# Patient Record
Sex: Male | Born: 1970 | Race: White | Hispanic: No | Marital: Married | Smoking: Current every day smoker
Health system: Southern US, Community
[De-identification: ages and names within clinical notes are randomized; demographics above are authoritative.]

## PROBLEM LIST (undated history)

## (undated) DIAGNOSIS — N400 Enlarged prostate without lower urinary tract symptoms: Secondary | ICD-10-CM

## (undated) DIAGNOSIS — E669 Obesity, unspecified: Secondary | ICD-10-CM

## (undated) DIAGNOSIS — E785 Hyperlipidemia, unspecified: Secondary | ICD-10-CM

## (undated) DIAGNOSIS — G4730 Sleep apnea, unspecified: Secondary | ICD-10-CM

## (undated) DIAGNOSIS — E349 Endocrine disorder, unspecified: Secondary | ICD-10-CM

## (undated) DIAGNOSIS — N403 Nodular prostate with lower urinary tract symptoms: Secondary | ICD-10-CM

## (undated) DIAGNOSIS — K429 Umbilical hernia without obstruction or gangrene: Secondary | ICD-10-CM

## (undated) DIAGNOSIS — N451 Epididymitis: Secondary | ICD-10-CM

## (undated) DIAGNOSIS — R399 Unspecified symptoms and signs involving the genitourinary system: Secondary | ICD-10-CM

## (undated) DIAGNOSIS — Z98811 Dental restoration status: Secondary | ICD-10-CM

## (undated) DIAGNOSIS — R519 Headache, unspecified: Secondary | ICD-10-CM

## (undated) DIAGNOSIS — R51 Headache: Secondary | ICD-10-CM

## (undated) HISTORY — DX: Umbilical hernia without obstruction or gangrene: K42.9

## (undated) HISTORY — DX: Unspecified symptoms and signs involving the genitourinary system: R39.9

## (undated) HISTORY — DX: Endocrine disorder, unspecified: E34.9

## (undated) HISTORY — DX: Obesity, unspecified: E66.9

## (undated) HISTORY — DX: Hyperlipidemia, unspecified: E78.5

## (undated) HISTORY — DX: Epididymitis: N45.1

## (undated) HISTORY — PX: FRACTURE SURGERY: SHX138

## (undated) HISTORY — DX: Sleep apnea, unspecified: G47.30

## (undated) HISTORY — DX: Nodular prostate with lower urinary tract symptoms: N40.3

## (undated) HISTORY — PX: ROOT CANAL: SHX2363

## (undated) HISTORY — DX: Benign prostatic hyperplasia without lower urinary tract symptoms: N40.0

## (undated) HISTORY — PX: WRIST FRACTURE SURGERY: SHX121

---

## 2004-01-04 ENCOUNTER — Emergency Department: Payer: Self-pay | Admitting: Emergency Medicine

## 2006-07-22 ENCOUNTER — Emergency Department: Payer: Self-pay | Admitting: Emergency Medicine

## 2007-01-13 ENCOUNTER — Emergency Department: Payer: Self-pay | Admitting: Emergency Medicine

## 2008-04-13 IMAGING — CT CT MAXILLOFACIAL WITHOUT CONTRAST
1 series · 16 of 30 positions shown, 20 images · non-contrast
Comparison: none

REASON FOR EXAM: hit with baseball on chin, pain left TMJ area x 6 days
Minor Care 1
COMMENTS:   LMP: (Male)

[Series 4: facial 3.0 h60f · axial · 0.31mm/px · z∈[-219,-72]mm · 16 of 53 slices shown, 20 images]
[im 2/53  brain]
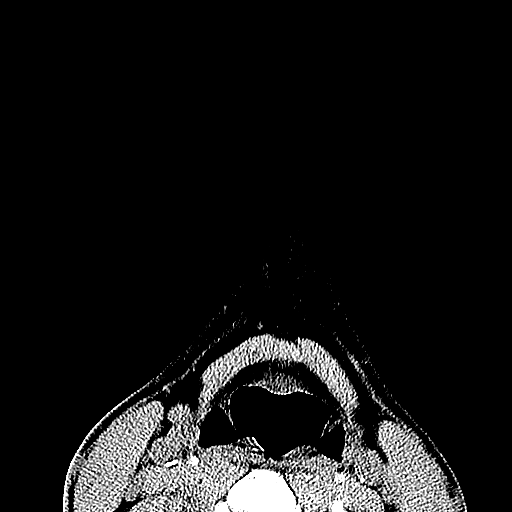
[im 2/53  bone]
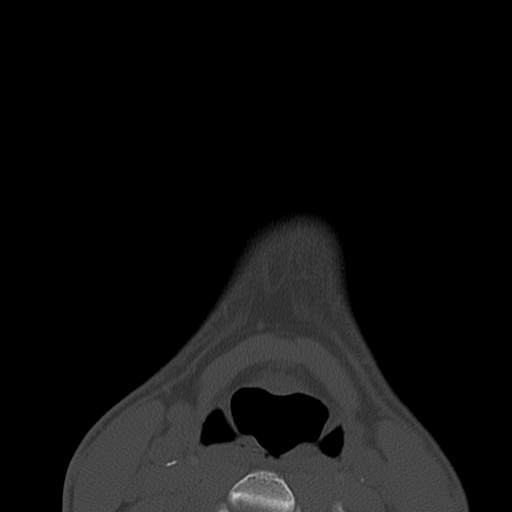
[im 6/53  bone]
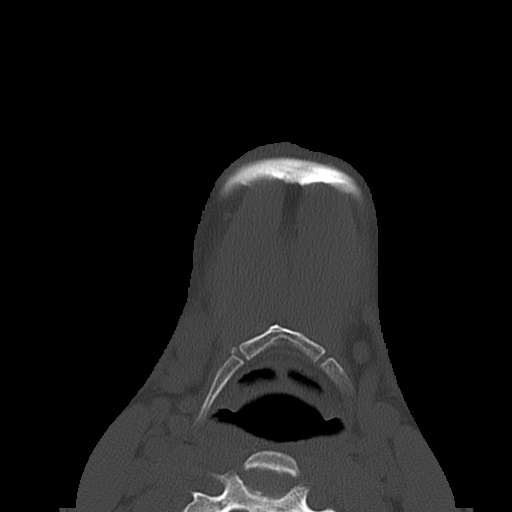
[im 9/53  bone]
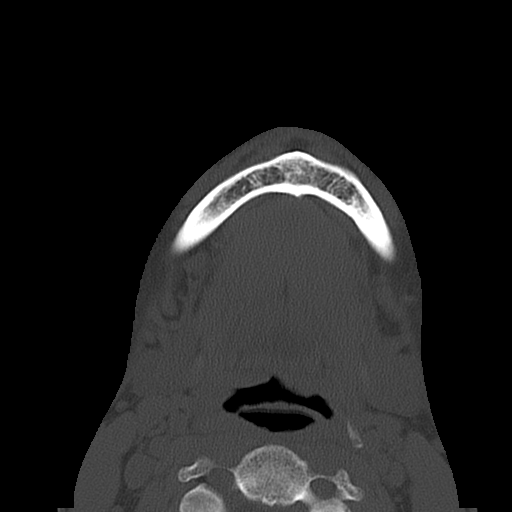
[im 13/53  bone]
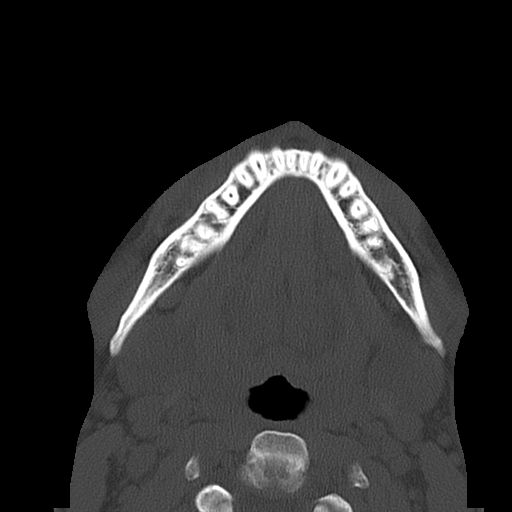
[im 15/53  brain]
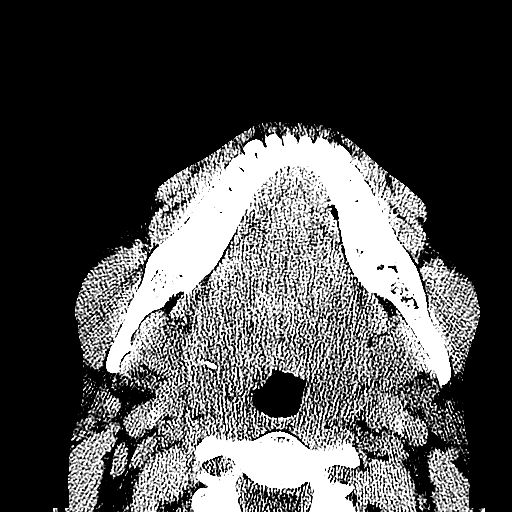
[im 15/53  bone]
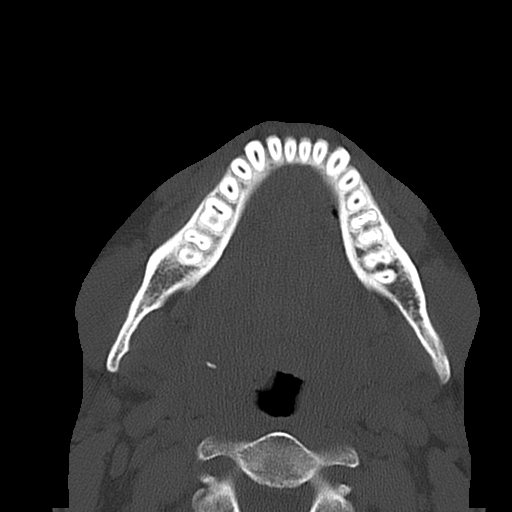
[im 18/53  bone]
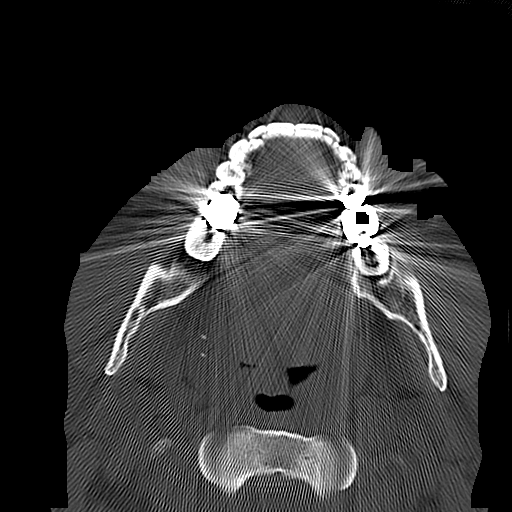
[im 22/53  bone]
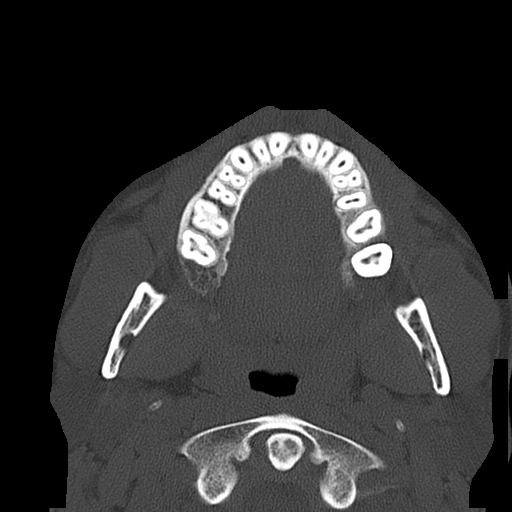
[im 26/53  bone]
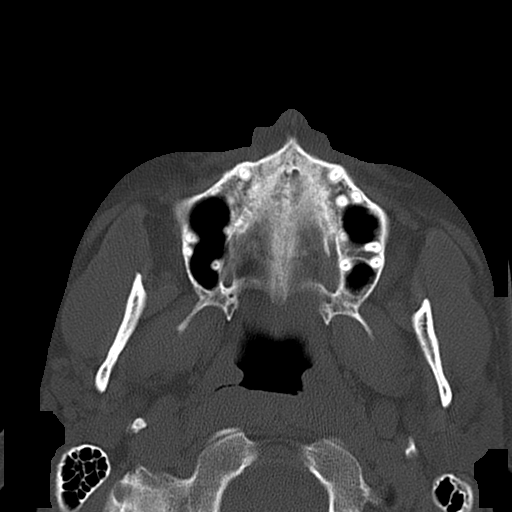
[im 27/53  brain]
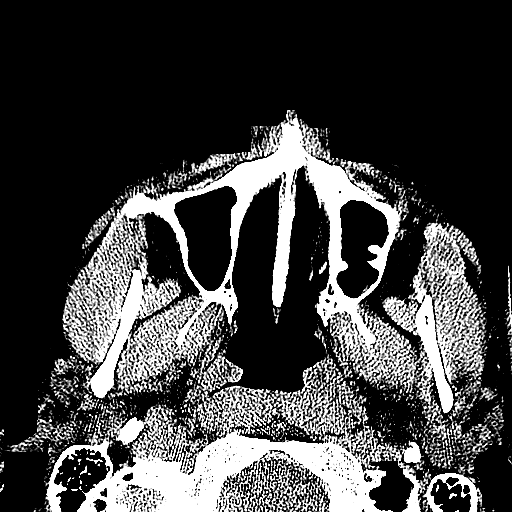
[im 27/53  bone]
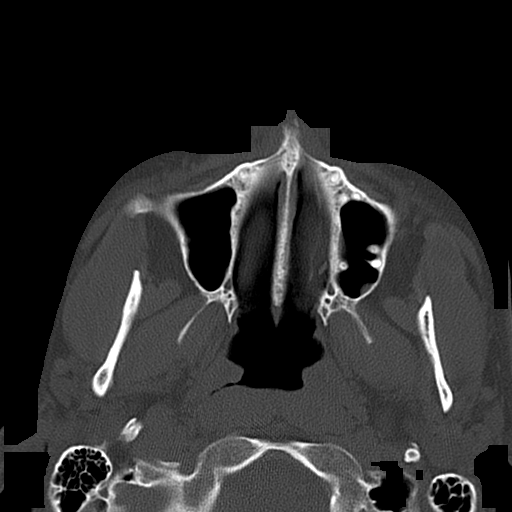
[im 31/53  bone]
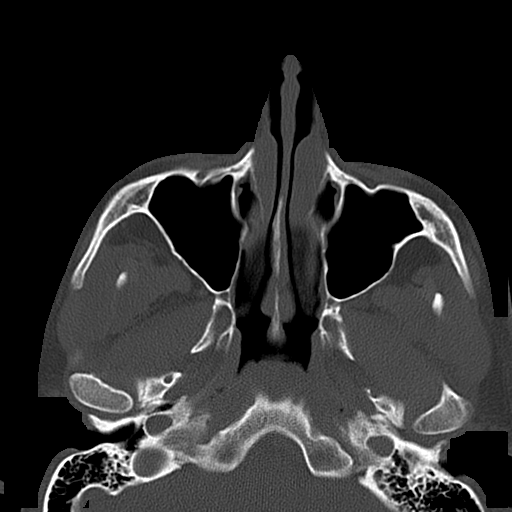
[im 35/53  bone]
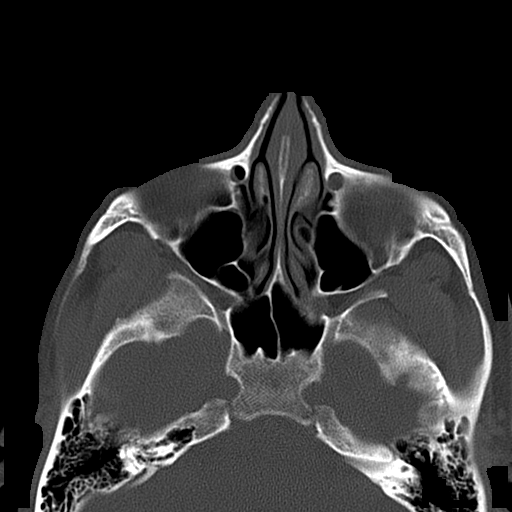
[im 38/53  bone]
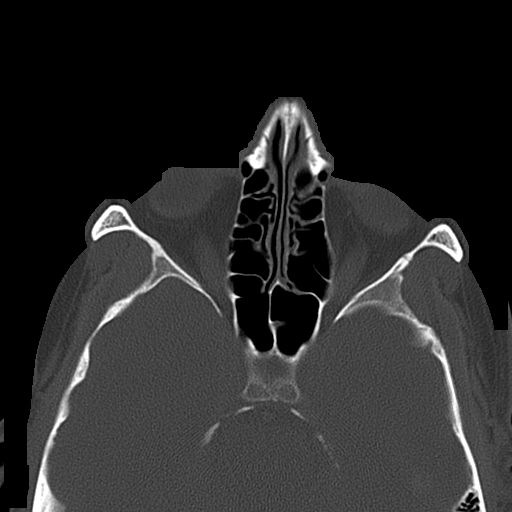
[im 40/53  brain]
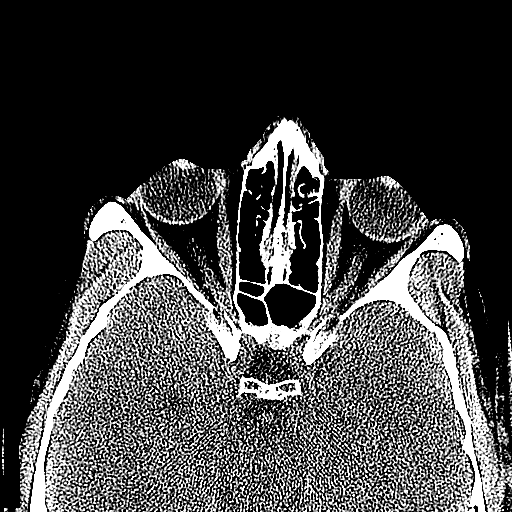
[im 40/53  bone]
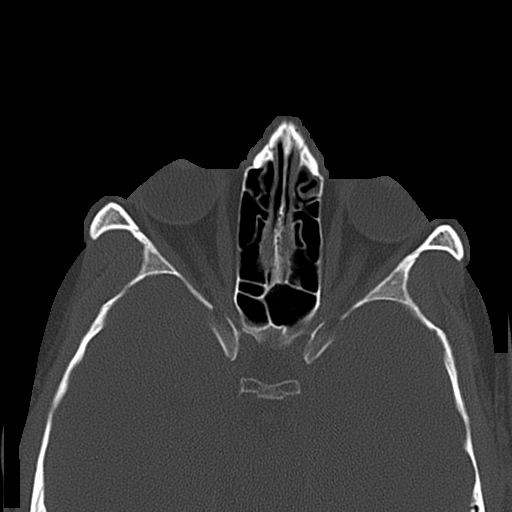
[im 44/53  bone]
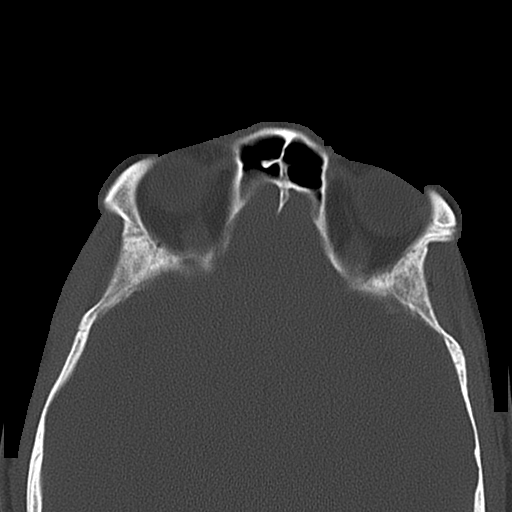
[im 47/53  bone]
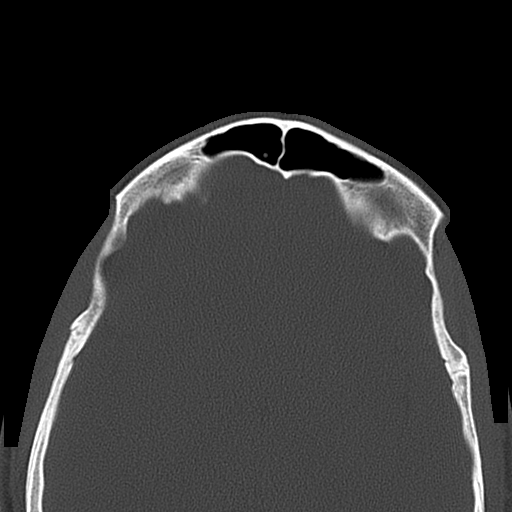
[im 51/53  bone]
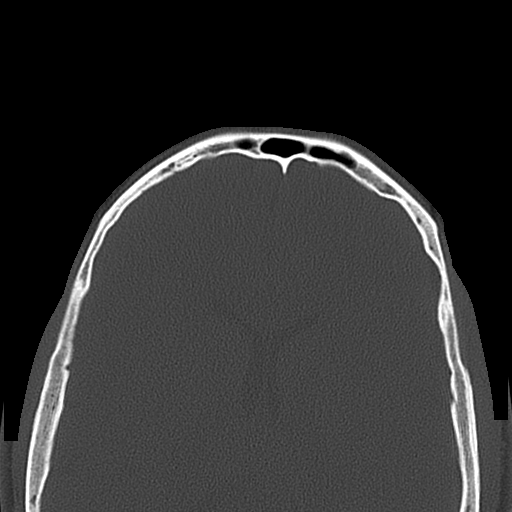

[16 of 30 positions shown; findings below may reference images not displayed]

PROCEDURE:     CT  - CT MAXILLOFACIAL AREA WO  - July 22, 2006  [DATE]

RESULT:     Multislice axial acquisition through the maxillofacial region is
performed. Coronal reconstructions are performed. The images are
reconstructed in bone window settings only. The mandible and
temporomandibular joints appear to be intact with no dislocation or fracture
demonstrated. The paranasal sinuses are normally aerated. There is a defect
in the floor of the left orbit consistent with a depressed orbital floor
fracture. No muscular entrapment is present. The nasal septum approximates
midline. The nasal bones are intact.
IMPRESSION: 1. Probable old left orbital floor fracture. This is a depressed fracture.
2. No acute bony abnormality suggested.

## 2013-02-25 HISTORY — PX: PROSTATE BIOPSY: SHX241

## 2013-07-02 LAB — LIPID PANEL
Cholesterol: 205 mg/dL — AB (ref 0–200)
HDL: 35 mg/dL (ref 35–70)
LDL CALC: 127 mg/dL
Triglycerides: 216 mg/dL — AB (ref 40–160)

## 2013-07-05 LAB — PSA: PSA: NORMAL

## 2013-07-21 ENCOUNTER — Encounter: Payer: Self-pay | Admitting: General Surgery

## 2013-08-04 ENCOUNTER — Encounter: Payer: Self-pay | Admitting: General Surgery

## 2013-08-04 ENCOUNTER — Telehealth: Payer: Self-pay | Admitting: *Deleted

## 2013-08-04 ENCOUNTER — Ambulatory Visit (INDEPENDENT_AMBULATORY_CARE_PROVIDER_SITE_OTHER): Payer: BC Managed Care – PPO | Admitting: General Surgery

## 2013-08-04 VITALS — BP 120/80 | HR 80 | Resp 14 | Ht 71.0 in | Wt 230.0 lb

## 2013-08-04 DIAGNOSIS — K429 Umbilical hernia without obstruction or gangrene: Secondary | ICD-10-CM

## 2013-08-04 NOTE — Progress Notes (Signed)
Patient ID: Jeffery [name], male   DOB: July 27, 1970, 43 y.o.   MRN: [id number]  Chief Complaint  Patient presents with  . Other    Evaluation of umbilical hernia    HPI Jeffery [name] is a 43 y.o. male who presents for an evaluation of an umbilical hernia. The patient noticed the hernia a couple of months ago. He denies any pain or tenderness in the area. It has not gotten larger since he first noticed it.    HPI  Past Medical History  Diagnosis Date  . Testosterone deficiency   . Enlarged prostate     Past Surgical History  Procedure Laterality Date  . Wrist fracture surgery Right   . Prostate biopsy  2015    Family History  Problem Relation Age of Onset  . Diabetes Mother     Social History History  Substance Use Topics  . Smoking status: Former Smoker -- 20 years  . Smokeless tobacco: Not on file  . Alcohol Use: No    No Known Allergies  Current Outpatient Prescriptions  Medication Sig Dispense Refill  . ANDROGEL PUMP 20.25 MG/ACT (1.62%) GEL Apply 1 application topically daily.      . tadalafil (CIALIS) 5 MG tablet Take 5 mg by mouth daily as needed for erectile dysfunction.       No current facility-administered medications for this visit.    Review of Systems Review of Systems  Constitutional: Negative.   Respiratory: Negative.   Cardiovascular: Negative.   Gastrointestinal: Negative.     Blood pressure 120/80, pulse 80, resp. rate 14, height 5\' 11"  (1.803 m), weight 230 lb (104.327 kg).  Physical Exam Physical Exam  Constitutional: He is oriented to person, place, and time. He appears well-developed and well-nourished.  Cardiovascular: Normal rate, regular rhythm and normal heart sounds.   No murmur heard. Pulmonary/Chest: Effort normal and breath sounds normal.  Abdominal: Soft. Normal appearance and bowel sounds are normal. There is no hepatosplenomegaly. There is tenderness (with touching.). A hernia (1 cm umbilical defect) is present.   Neurological: He is alert and oriented to person, place, and time.  Skin: Skin is warm and dry.    Data Reviewed PCP notes of 07/20/2013 were reviewed.   Assessment    No hernia, minimally symptomatic. Incarcerated preperitoneal fat.    Plan    Options for management were reviewed. The patient drains physically active. 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 He is well versed on proper lifting techniques. I think based on his young age the benefit from elective repair. The use of prosthetic mesh would be determined at the time of surgery based on the size of the defect.  Risks associated with hernia surgery with or without mesh placement were reviewed. The patient will consider his options and notify the office of how he would like to proceed.    PCP and Red. MD: Dr. Duane Boston, [place] Gleason 08/06/2013, 12:39 PM

## 2013-08-04 NOTE — Telephone Encounter (Signed)
Pt called and wanted to know if we can bill him for his co pay on visit 08/04/13 at 2:30, spoke with Dr.Byrnett and he said that is fine to bill pt

## 2013-08-04 NOTE — Patient Instructions (Addendum)
Patient to decide when he wants to have his hernia repair. The patient is aware to call back for any questions or concerns.   Open Hernia Repair Open hernia repair is surgery to fix a hernia. A hernia occurs when an internal organ or tissue pushes out through a weak spot in the abdominal wall muscles. Hernias commonly occur in the groin and around the navel. Most hernias tend to get worse over time. Surgery is often done to prevent the hernia from getting bigger, becoming uncomfortable, or becoming an emergency. Emergency surgery may be needed if abdominal contents get stuck in the opening (incarcerated hernia) or the blood supply gets cut off (strangulated hernia). In an open repair, a large cut (incision) is made in the abdomen to perform the surgery. LET [facility] CARE PROVIDER KNOW ABOUT:  Any allergies you have.  All medicines you are taking, including vitamins, herbs, eye drops, creams, and over-the-counter medicines.  Previous problems you or members of your family have had with the use of anesthetics.  Any blood disorders you have.  Previous surgeries you have had.  Medical conditions you have. RISKS AND COMPLICATIONS Generally, this is a safe procedure. However, as with any procedure, complications can occur. Possible complications include:  Infection.  Bleeding.  Nerve injury.  Chronic pain.  The hernia can come back.  Injury to the intestines. BEFORE THE PROCEDURE  Ask your health care provider about changing or stopping any regular medicines. Avoid taking aspirin or blood thinners as directed by your health care provider.  Do noteat or drink anything after midnight the night before surgery.  If you smoke, do not smoke for at least 2 weeks before your surgery.  Do not drink alcohol the day before your surgery.  Let your health care provider know if you develop a cold or any infection before your surgery.  Arrange for someone to drive you home after the  procedure or after your hospital stay. Also arrange for someone to help you with activities during recovery. PROCEDURE   Small monitors will be put on your body. They are used to check your heart, blood pressure, and oxygen level.   An IV access tube will be put into one of your veins. Medicine will be able to flow directly into your body through this IV tube.   You might be given a medicine to help you relax (sedative).   You will be given a medicine to make you sleep (general anesthetic). A breathing tube may be placed into your lungs during the procedure.  A cut (incision) is made over the hernia defect, and the contents are pushed back into the abdomen.  If the hernia is small, stitches may be used to bring the muscle edges back together.  Typically, a surgeon will place a mesh patch made of man-made material (synthetic) to cover the defect. The mesh is sewn to healthy muscle. This reduces the risk of the hernia coming back.  The tissue and skin over the hernia are then closed with stitches or staples.  If the hernia was large, a drain may be left in place to collect excess fluid where the hernia used to be.  Bandages (dressings) are used to cover the incision. AFTER THE PROCEDURE  You will be taken to a recovery area where your progress will be monitored.  If the hernia was small or in the groin (inguinal) region, you will likely be allowed to go home once you are awake, stable, and taking fluids well.  If the hernia was large, you may have to wait for your bowel function to return. You may need to stay in the hospital for 2 3 days until you can eat and your pain is controlled. A drain may be left in place for 5 7 days. You will be taught how to care for the drain. Document Released: 08/07/2000 Document Revised: 12/02/2012 Document Reviewed: 09/23/2012 [facility] Patient Information 2014 [place], [place].

## 2013-08-06 DIAGNOSIS — K429 Umbilical hernia without obstruction or gangrene: Secondary | ICD-10-CM | POA: Insufficient documentation

## 2014-01-28 ENCOUNTER — Telehealth: Payer: Self-pay | Admitting: *Deleted

## 2014-01-28 NOTE — Telephone Encounter (Signed)
Patient has decided to go ahead with surgical repair of his umbilical hernia. He would like to to this on 02/11/14 as he will be out of work at that time on vacation. I let him know that he would need a pre op visit here with the surgeon. I told him I would call him to go over surgical instructions when his orders are received.

## 2014-01-31 ENCOUNTER — Encounter: Payer: Self-pay | Admitting: *Deleted

## 2014-01-31 NOTE — Progress Notes (Signed)
Patient ID: Jeffery [name], male   DOB: [birth year], 43 y.o.   MRN: [id number]  Patient's surgery has been scheduled for 02-11-14 at [facility].  This patient will come in for a pre-op visit next Tuesday, 02-08-14 at 3 pm.

## 2014-02-01 ENCOUNTER — Other Ambulatory Visit: Payer: Self-pay | Admitting: General Surgery

## 2014-02-01 DIAGNOSIS — K429 Umbilical hernia without obstruction or gangrene: Secondary | ICD-10-CM

## 2014-02-04 ENCOUNTER — Telehealth: Payer: Self-pay

## 2014-02-04 NOTE — Telephone Encounter (Signed)
Patient called and wanted to reschedule his surgery for in January due to his health savings account rolling over. Patient's surgery has been rescheduled for 03/11/14 at [facility]. He will come into the office for a pre op visit with Dr Bary Castilla on 03/02/14. He will pre admit by phone on 03/03/14. Patient is aware of dates, time, and instructions. Surgery has been rescheduled with the OR.

## 2014-02-08 ENCOUNTER — Ambulatory Visit: Payer: BC Managed Care – PPO | Admitting: General Surgery

## 2014-03-02 ENCOUNTER — Other Ambulatory Visit: Payer: Self-pay | Admitting: General Surgery

## 2014-03-02 ENCOUNTER — Ambulatory Visit: Payer: BC Managed Care – PPO | Admitting: General Surgery

## 2014-03-02 DIAGNOSIS — K429 Umbilical hernia without obstruction or gangrene: Secondary | ICD-10-CM

## 2014-03-08 ENCOUNTER — Encounter: Payer: Self-pay | Admitting: General Surgery

## 2014-03-08 ENCOUNTER — Ambulatory Visit (INDEPENDENT_AMBULATORY_CARE_PROVIDER_SITE_OTHER): Payer: BLUE CROSS/BLUE SHIELD | Admitting: General Surgery

## 2014-03-08 VITALS — BP 118/84 | HR 84 | Resp 14 | Ht 71.0 in | Wt 224.0 lb

## 2014-03-08 DIAGNOSIS — K429 Umbilical hernia without obstruction or gangrene: Secondary | ICD-10-CM

## 2014-03-08 NOTE — Progress Notes (Signed)
Patient ID: Jeffery [name], male   DOB: 1970-04-07, 44 y.o.   MRN: [id number]  Chief Complaint  Patient presents with  . Pre-op Exam    umbilical hernia    HPI Jeffery [name] is a 44 y.o. male who presents for a pre op evaluation of an umbilical hernia. He states no new problems at this time.   HPI  Past Medical History  Diagnosis Date  . Testosterone deficiency   . Enlarged prostate   . Umbilical hernia     Past Surgical History  Procedure Laterality Date  . Wrist fracture surgery Right   . Prostate biopsy  2015    Family History  Problem Relation Age of Onset  . Diabetes Mother     Social History History  Substance Use Topics  . Smoking status: Current Every Day Smoker -- 0.50 packs/day for 20 years  . Smokeless tobacco: Not on file  . Alcohol Use: No    No Known Allergies  Current Outpatient Prescriptions  Medication Sig Dispense Refill  . ANDROGEL PUMP 20.25 MG/ACT (1.62%) GEL Apply 1 application topically daily.    . tadalafil (CIALIS) 5 MG tablet Take 5 mg by mouth daily as needed for erectile dysfunction.     No current facility-administered medications for this visit.    Review of Systems Review of Systems  Constitutional: Negative.   Respiratory: Negative.   Cardiovascular: Negative.   Gastrointestinal: Negative.     Blood pressure 118/84, pulse 84, resp. rate 14, height 5\' 11"  (1.803 m), weight 224 lb (101.606 kg).  Physical Exam Physical Exam  Constitutional: He is oriented to person, place, and time. He appears well-developed and well-nourished.  Cardiovascular: Normal rate, regular rhythm and normal heart sounds.   No murmur heard. Pulmonary/Chest: Effort normal and breath sounds normal.  Abdominal: Soft. Normal appearance and bowel sounds are normal. There is no hepatosplenomegaly. There is no tenderness. A hernia (umbilical hernia present) is present.  Neurological: He is alert and oriented to person, place, and time.  Skin: Skin is  warm and dry.    Data Reviewed none  Assessment    Umbilical hernia.     Plan    Plans are for repair on 03/11/2014 as an outpatient. 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 The need to avoid heavy lifting for 3 weeks post procedure was reviewed. 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   He's been instructed not to drive until pain free.  The patient reports no voiding symptoms while making use of Cialis. ( Followed by Jeffery [name], M.D. In urology).    PCP:  Jeffery [name] 03/09/2014, 7:01 AM

## 2014-03-08 NOTE — Patient Instructions (Signed)
Patient is scheduled for hernia repair. The patient is aware to call back for any questions or concerns.

## 2014-03-11 ENCOUNTER — Ambulatory Visit: Payer: Self-pay | Admitting: General Surgery

## 2014-03-11 DIAGNOSIS — K429 Umbilical hernia without obstruction or gangrene: Secondary | ICD-10-CM

## 2014-03-11 HISTORY — PX: HERNIA REPAIR: SHX51

## 2014-03-14 ENCOUNTER — Encounter: Payer: Self-pay | Admitting: General Surgery

## 2014-03-24 ENCOUNTER — Ambulatory Visit (INDEPENDENT_AMBULATORY_CARE_PROVIDER_SITE_OTHER): Payer: Self-pay | Admitting: General Surgery

## 2014-03-24 ENCOUNTER — Encounter: Payer: Self-pay | Admitting: General Surgery

## 2014-03-24 VITALS — BP 118/78 | HR 76 | Resp 12 | Ht 71.0 in | Wt 224.0 lb

## 2014-03-24 DIAGNOSIS — K429 Umbilical hernia without obstruction or gangrene: Secondary | ICD-10-CM

## 2014-03-24 NOTE — Progress Notes (Signed)
Patient ID: Jeffery [name], male   DOB: Sep 14, 1970, 44 y.o.   MRN: [id number]  Chief Complaint  Patient presents with  . Routine Post Op    umbilical hernia    HPI Jeffery [name] is a 44 y.o. male here today for his post op umbilical hernia repair done on 03/11/14. Patient states he is doing well.Marland Kitchen  HPI  Past Medical History  Diagnosis Date  . Testosterone deficiency   . Enlarged prostate   . Umbilical hernia     Past Surgical History  Procedure Laterality Date  . Wrist fracture surgery Right   . Prostate biopsy  2015  . Hernia repair  08/27/48    umbilical hernia    Family History  Problem Relation Age of Onset  . Diabetes Mother     Social History History  Substance Use Topics  . Smoking status: Current Every Day Smoker -- 0.50 packs/day for 20 years  . Smokeless tobacco: Not on file  . Alcohol Use: No    No Known Allergies  Current Outpatient Prescriptions  Medication Sig Dispense Refill  . ANDROGEL PUMP 20.25 MG/ACT (1.62%) GEL Apply 1 application topically daily.    . tadalafil (CIALIS) 5 MG tablet Take 5 mg by mouth daily as needed for erectile dysfunction.     No current facility-administered medications for this visit.    Review of Systems Review of Systems  Constitutional: Negative.   Respiratory: Negative.   Cardiovascular: Negative.     Blood pressure 118/78, pulse 76, resp. rate 12, height 5\' 11"  (1.803 m), weight 224 lb (101.606 kg).  Physical Exam Physical Exam  Constitutional: He is oriented to person, place, and time. He appears well-nourished.  Cardiovascular: Normal rate, regular rhythm and normal heart sounds.   Pulmonary/Chest: Effort normal and breath sounds normal.  Abdominal:    Neurological: He is alert and oriented to person, place, and time.  Skin: Skin is warm and dry.  No erythema or induration at the surgical site.     Assessment    Doing well status post umbilical hernia repair.    Plan    The importance of  careful lifting technique was discussed. Patient to return as needed. Patient to return back to work on 04/11/14. Proper lifting techniques reviewed.  The patient was asked to apply Neosporin ointment to the scab until this completely sloughs to facilitate healing and minimize bleeding risks.     PCP: Maryanna Shape 03/26/2014, 8:47 AM

## 2014-03-24 NOTE — Patient Instructions (Addendum)
Patient to return as needed. Patient to return back to work on 04/11/14. Proper lifting techniques reviewed.

## 2014-05-27 ENCOUNTER — Emergency Department
Admit: 2014-05-27 | Disposition: A | Discharge status: Home / Self Care | Payer: Self-pay | Admitting: Emergency Medicine

## 2014-05-27 LAB — URINALYSIS, COMPLETE
BACTERIA: NONE SEEN
BILIRUBIN, UR: NEGATIVE
Blood: NEGATIVE
GLUCOSE, UR: NEGATIVE mg/dL (ref 0–75)
Ketone: NEGATIVE
LEUKOCYTE ESTERASE: NEGATIVE
Nitrite: NEGATIVE
PH: 6 (ref 4.5–8.0)
Protein: NEGATIVE
RBC,UR: <1 /HPF (ref 0–5)
Specific Gravity: 1.014 (ref 1.003–1.030)
Squamous Epithelial: NONE SEEN
WBC UR: <1 /HPF (ref 0–5)

## 2014-06-26 NOTE — Op Note (Signed)
PATIENT NAME:  Jeffery [name], Jeffery [name] MR#:  [id number] DATE OF BIRTH:  August 01, 1970  DATE OF PROCEDURE:  03/11/2014  PREOPERATIVE DIAGNOSIS: Symptomatic umbilical hernia.   POSTOPERATIVE DIAGNOSIS: Symptomatic umbilical hernia.   OPERATIVE PROCEDURE: Repair of umbilical hernia.   SURGEON: Hervey Ard, MD   ANESTHESIA: General by LMA under Dr. Benjamine Mola, Marcaine 0.5% plain, 30 mL local infiltration, Toradol 30 mg.   ESTIMATED BLOOD LOSS: Minimal.   CLINICAL NOTE: This 44 year old male has developed a symptomatic umbilical hernia with nonreducible preperitoneal fat. He is admitted for elective repair.   OPERATIVE NOTE: With the patient under adequate general anesthesia and the hair previously removed with clippers, the area was prepped with ChloraPrep and draped. Marcaine was infiltrated for postoperative analgesia. An infraumbilical incision was made and carried down through the skin and subcutaneous tissue with hemostasis achieved with electrocautery. The hernia sac was excised and discarded. The fascial defect was approximately 1 cm in diameter. The undersurface of the fascia was cleared and the defect closed with interrupted 0 Surgilon sutures. These were placed and then tied sequentially. The umbilical skin was tacked to the fascia with a 3-0 Vicryl figure-of-eight suture. The adipose layer was closed with a running 3-0 Vicryl, and the skin closed with running 4-0 Vicryl subcuticular suture. Prior to the procedure, Marcaine was infiltrated for postoperative analgesia. Toradol was placed into the wound prior to closure. Benzoin and Steri-Strips followed by Telfa and Tegaderm dressings were applied.   The patient tolerated the procedure well and was taken to the recovery room in stable condition. ____________________________ Jamaar Bellow, MD jwb:sb D: 03/11/2014 10:44:20 ET T: 03/11/2014 12:09:45 ET JOB#: [id number]  cc: Deklen Bellow, MD, <Dictator> Guadalupe Maple, MD Zandyr Barnhill Amedeo Kinsman  MD ELECTRONICALLY SIGNED 03/11/2014 17:23

## 2014-08-25 ENCOUNTER — Ambulatory Visit: Payer: Self-pay | Admitting: Urology

## 2014-09-19 ENCOUNTER — Ambulatory Visit: Payer: Self-pay | Admitting: Urology

## 2014-09-19 ENCOUNTER — Encounter: Payer: Self-pay | Admitting: Urology

## 2014-09-21 ENCOUNTER — Ambulatory Visit: Admit: 2014-09-21 | Payer: Self-pay | Admitting: Otolaryngology

## 2014-09-21 SURGERY — SEPTOPLASTY, NOSE
Anesthesia: General | Laterality: Bilateral

## 2014-10-10 ENCOUNTER — Other Ambulatory Visit: Payer: Self-pay | Admitting: Family Medicine

## 2014-10-18 NOTE — Telephone Encounter (Signed)
Have tried contacting the patient several times and no response.

## 2014-10-20 ENCOUNTER — Encounter: Payer: Self-pay | Admitting: Family Medicine

## 2014-10-20 ENCOUNTER — Ambulatory Visit (INDEPENDENT_AMBULATORY_CARE_PROVIDER_SITE_OTHER): Payer: BLUE CROSS/BLUE SHIELD | Admitting: Family Medicine

## 2014-10-20 VITALS — BP 110/60 | HR 92 | Temp 97.9°F | Resp 18 | Ht 71.0 in | Wt 218.5 lb

## 2014-10-20 DIAGNOSIS — E669 Obesity, unspecified: Secondary | ICD-10-CM

## 2014-10-20 DIAGNOSIS — N400 Enlarged prostate without lower urinary tract symptoms: Secondary | ICD-10-CM | POA: Diagnosis not present

## 2014-10-20 DIAGNOSIS — N410 Acute prostatitis: Secondary | ICD-10-CM | POA: Diagnosis not present

## 2014-10-20 DIAGNOSIS — N411 Chronic prostatitis: Secondary | ICD-10-CM

## 2014-10-20 MED ORDER — SILDENAFIL CITRATE 20 MG PO TABS: 20.0000 mg | ORAL_TABLET | Freq: Two times a day (BID) | ORAL | Status: DC

## 2014-10-20 MED ORDER — LEVOFLOXACIN 500 MG PO TABS: 500.0000 mg | ORAL_TABLET | Freq: Every day | ORAL | Status: DC

## 2014-10-21 ENCOUNTER — Encounter: Payer: Self-pay | Admitting: Family Medicine

## 2014-10-21 DIAGNOSIS — N411 Chronic prostatitis: Secondary | ICD-10-CM | POA: Insufficient documentation

## 2014-10-21 DIAGNOSIS — N410 Acute prostatitis: Secondary | ICD-10-CM | POA: Insufficient documentation

## 2014-10-21 DIAGNOSIS — N400 Enlarged prostate without lower urinary tract symptoms: Secondary | ICD-10-CM | POA: Insufficient documentation

## 2014-10-21 DIAGNOSIS — E663 Overweight: Secondary | ICD-10-CM | POA: Insufficient documentation

## 2014-10-21 NOTE — Patient Instructions (Signed)
Referral to urologist  

## 2014-10-21 NOTE — Progress Notes (Signed)
Name: Jeffery [name]   MRN: [id number]    DOB: August 25, 1970   Date:10/21/2014       Progress Note  Subjective  Chief Complaint  Chief Complaint  Patient presents with  . Benign Prostatic Hypertrophy    HPI  Prostatitis  Patient has a history of chronic prostatitis and BPH being treated by local urologist. Recently he has had a flareup and is not able to get in to see his urologist in the fairly near future. He has been on levofloxacin with some success in the past and at one point was on what sounds to be Flomax as well.  Currently he has some discomfort in the groin area as well as some discomfort with urination and slightly increased frequency no documented fever or chills. There is no history of standards. At one point he was on sildenafil 5 mg daily for his BPH but this: By his insurance and therefore he is on generic Viagra 20 mg twice a day with some improvement    Past Medical History  Diagnosis Date  . Testosterone deficiency   . Enlarged prostate   . Umbilical hernia     Social History  Substance Use Topics  . Smoking status: Former Smoker -- 0.50 packs/day for 20 years  . Smokeless tobacco: Current User  . Alcohol Use: No     Current outpatient prescriptions:  .  ANDROGEL PUMP 20.25 MG/ACT (1.62%) GEL, Apply 1 application topically daily., Disp: , Rfl:  .  levofloxacin (LEVAQUIN) 500 MG tablet, Take 1 tablet (500 mg total) by mouth daily., Disp: 30 tablet, Rfl: 0 .  sildenafil (REVATIO) 20 MG tablet, Take 1 tablet (20 mg total) by mouth 2 (two) times daily., Disp: 60 tablet, Rfl: 0 .  tadalafil (CIALIS) 5 MG tablet, Take 5 mg by mouth daily as needed for erectile dysfunction., Disp: , Rfl:   No Known Allergies  Review of Systems  Constitutional: Negative for fever, chills and weight loss.  Respiratory: Negative for cough and shortness of breath.   Cardiovascular: Negative for chest pain and palpitations.  Gastrointestinal: Positive for abdominal pain. Negative  for heartburn, nausea, vomiting, diarrhea, constipation, blood in stool and melena.  Genitourinary: Positive for dysuria, urgency and frequency. Negative for hematuria and flank pain.       Groin and inguinal pain  Musculoskeletal: Negative for myalgias.  Skin: Negative for rash.  Neurological: Negative for headaches.  Endo/Heme/Allergies: Negative for polydipsia.     Objective  Filed Vitals:   10/20/14 1150  BP: 110/60  Pulse: 92  Temp: 97.9 F (36.6 C)  TempSrc: Oral  Resp: 18  Height: 5\' 11"  (1.803 m)  Weight: 218 lb 8 oz (99.111 kg)  SpO2: 98%     Physical Exam  Constitutional:  Modestly obese  HENT:  Head: Normocephalic.  Neck: Neck supple.  Cardiovascular: Normal rate, regular rhythm and normal heart sounds.   Pulmonary/Chest: Breath sounds normal.  Abdominal: Soft. Bowel sounds are normal. There is no tenderness.  Genitourinary: Rectum normal, prostate normal and penis normal. Guaiac negative stool. No discharge found.      Assessment & Plan  1. Acute prostatitis Restart Levaquin which is worked in the past - levofloxacin (LEVAQUIN) 500 MG tablet; Take 1 tablet (500 mg total) by mouth daily.  Dispense: 30 tablet; Refill: 0  2. Chronic prostatitis Obesity addressed by urologist particularly at his young age as this can be a forerunner of the eventual development of prostate cancer according to some medical literature  3. BPH (benign prostatic hypertrophy) History of present illness addressed by his urologist - sildenafil (REVATIO) 20 MG tablet; Take 1 tablet (20 mg total) by mouth 2 (two) times daily.  Dispense: 60 tablet; Refill: 0 - Ambulatory referral to Urology  4. Obesity Diet and exercise will be encouraged

## 2014-11-10 ENCOUNTER — Other Ambulatory Visit: Payer: Self-pay | Admitting: Family Medicine

## 2014-11-11 ENCOUNTER — Encounter: Payer: Self-pay | Admitting: Urology

## 2014-11-11 ENCOUNTER — Ambulatory Visit (INDEPENDENT_AMBULATORY_CARE_PROVIDER_SITE_OTHER): Payer: BLUE CROSS/BLUE SHIELD | Admitting: Family Medicine

## 2014-11-11 ENCOUNTER — Encounter: Payer: Self-pay | Admitting: Family Medicine

## 2014-11-11 ENCOUNTER — Ambulatory Visit: Payer: BLUE CROSS/BLUE SHIELD | Admitting: Urology

## 2014-11-11 VITALS — BP 112/78 | HR 90 | Temp 98.0°F | Resp 16 | Ht 71.0 in | Wt 219.2 lb

## 2014-11-11 DIAGNOSIS — N529 Male erectile dysfunction, unspecified: Secondary | ICD-10-CM

## 2014-11-11 DIAGNOSIS — N401 Enlarged prostate with lower urinary tract symptoms: Secondary | ICD-10-CM

## 2014-11-11 DIAGNOSIS — J019 Acute sinusitis, unspecified: Secondary | ICD-10-CM

## 2014-11-11 DIAGNOSIS — N138 Other obstructive and reflux uropathy: Secondary | ICD-10-CM

## 2014-11-11 MED ORDER — AMOXICILLIN-POT CLAVULANATE 875-125 MG PO TABS: 1.0000 | ORAL_TABLET | Freq: Two times a day (BID) | ORAL | Status: DC

## 2014-11-11 MED ORDER — PREDNISONE 20 MG PO TABS: 20.0000 mg | ORAL_TABLET | Freq: Every day | ORAL | Status: DC

## 2014-11-11 MED ORDER — SILDENAFIL CITRATE 100 MG PO TABS: 50.0000 mg | ORAL_TABLET | Freq: Every day | ORAL | Status: DC | PRN

## 2014-11-11 NOTE — Progress Notes (Signed)
Name: Jeffery [name]   MRN: [id number]    DOB: 03-21-1970   Date:11/11/2014       Progress Note  Subjective  Chief Complaint  Chief Complaint  Patient presents with  . Sinus Problem    for 1 week headaches and congestion    Sinus Problem Associated symptoms include congestion. Pertinent negatives include no chills, coughing, headaches, neck pain, shortness of breath or sore throat.    Sinusitis      Patient presents with greater than 7 day history of nasal congestion and drainage which is purulent in color. There is tenderness over the sinuses. There has been fever to none along with some associated chills on occasion. Usage of over-the-counter medications is not been affected. There is also accompanying cough productive of purulent sputum.  Erectile dysfunction  Patient has a long-standing history of ED and also BPH for which she is followed by urologist. Wishes to have a refill 5 for Viagra year. He is seeing urologist   later today for his BPH  Past Medical History  Diagnosis Date  . Testosterone deficiency   . Enlarged prostate   . Umbilical hernia     Social History  Substance Use Topics  . Smoking status: Former Smoker -- 0.50 packs/day for 20 years  . Smokeless tobacco: Current User  . Alcohol Use: No     Current outpatient prescriptions:  .  ANDROGEL PUMP 20.25 MG/ACT (1.62%) GEL, Apply 1 application topically daily., Disp: , Rfl:  .  levofloxacin (LEVAQUIN) 500 MG tablet, Take 1 tablet (500 mg total) by mouth daily., Disp: 30 tablet, Rfl: 0 .  sildenafil (REVATIO) 20 MG tablet, Take 1 tablet (20 mg total) by mouth 2 (two) times daily., Disp: 60 tablet, Rfl: 0 .  tadalafil (CIALIS) 5 MG tablet, Take 5 mg by mouth daily as needed for erectile dysfunction., Disp: , Rfl:  .  VIAGRA 100 MG tablet, See admin instructions., Disp: , Rfl: 6 .  amoxicillin-clavulanate (AUGMENTIN) 875-125 MG per tablet, Take 1 tablet by mouth 2 (two) times daily., Disp: 20 tablet, Rfl:  0 .  predniSONE (DELTASONE) 20 MG tablet, Take 1 tablet (20 mg total) by mouth daily with breakfast., Disp: 10 tablet, Rfl: 0 .  sildenafil (VIAGRA) 100 MG tablet, Take 0.5-1 tablets (50-100 mg total) by mouth daily as needed for erectile dysfunction., Disp: 5 tablet, Rfl: 11  No Known Allergies  Review of Systems  Constitutional: Negative for fever, chills and weight loss.  HENT: Positive for congestion. Negative for hearing loss, sore throat and tinnitus.        Sinus pain particularly over the right frontal and maxillary area  Eyes: Negative for blurred vision, double vision and redness.  Respiratory: Negative for cough, hemoptysis and shortness of breath.   Cardiovascular: Negative for chest pain, palpitations, orthopnea, claudication and leg swelling.  Gastrointestinal: Negative for heartburn, nausea, vomiting, diarrhea, constipation and blood in stool.  Genitourinary: Positive for dysuria and urgency. Negative for frequency and hematuria.       BPH and ED  Musculoskeletal: Negative for myalgias, back pain, joint pain, falls and neck pain.  Skin: Negative for itching.  Neurological: Negative for dizziness, tingling, tremors, focal weakness, seizures, loss of consciousness, weakness and headaches.  Endo/Heme/Allergies: Does not bruise/bleed easily.  Psychiatric/Behavioral: Negative for depression and substance abuse. The patient is not nervous/anxious and does not have insomnia.      Objective  Filed Vitals:   11/11/14 0954  BP: 112/78  Pulse: 90  Temp: 98 F (36.7 C)  Resp: 16  Height: 5\' 11"  (1.803 m)  Weight: 219 lb 3 oz (99.423 kg)  SpO2: 96%     Physical Exam  Constitutional: He is oriented to person, place, and time and well-developed, well-nourished, and in no distress.  HENT:  Tenderness over the right frontal and maxillary sinuses. Markedly swollen nasal turbinates bilaterally with clear to slightly mucopurulent discharge present.  Eyes: EOM are normal. Pupils  are equal, round, and reactive to light.  Neck: Normal range of motion. Neck supple. No thyromegaly present.  Cardiovascular: Normal rate, regular rhythm and normal heart sounds.   No murmur heard. Pulmonary/Chest: Effort normal and breath sounds normal. No respiratory distress. He has no wheezes.  Abdominal: Soft. Bowel sounds are normal.  Musculoskeletal: Normal range of motion. He exhibits no edema.  Lymphadenopathy:    He has no cervical adenopathy.  Neurological: He is alert and oriented to person, place, and time. No cranial nerve deficit. Gait normal. Coordination normal.  Skin: Skin is warm and dry. No rash noted.  Psychiatric: Affect and judgment normal.       Assessment & Plan  1. Acute sinusitis, recurrence not specified, unspecified location He is to have surgery by his ENT within the next week or so - predniSONE (DELTASONE) 20 MG tablet; Take 1 tablet (20 mg total) by mouth daily with breakfast.  Dispense: 10 tablet; Refill: 0 - amoxicillin-clavulanate (AUGMENTIN) 875-125 MG per tablet; Take 1 tablet by mouth 2 (two) times daily.  Dispense: 20 tablet; Refill: 0  2. Erectile dysfunction, unspecified erectile dysfunction type Renew current med - sildenafil (VIAGRA) 100 MG tablet; Take 0.5-1 tablets (50-100 mg total) by mouth daily as needed for erectile dysfunction.  Dispense: 5 tablet; Refill: 11  3. BPH with obstruction/lower urinary tract symptoms Per urologist

## 2014-11-21 ENCOUNTER — Encounter: Payer: Self-pay | Admitting: *Deleted

## 2014-11-22 NOTE — Anesthesia Preprocedure Evaluation (Addendum)
Anesthesia Evaluation  Patient identified by MRN, date of birth, ID band  Airway Mallampati: II  TM Distance: >3 FB Neck ROM: Full    Dental   Pulmonary former smoker,           Cardiovascular      Neuro/Psych  Headaches,    GI/Hepatic   Endo/Other    Renal/GU    Decreased testosterone and prostatic hypertrophy    Musculoskeletal   Abdominal   Peds  Hematology   Anesthesia Other Findings   Reproductive/Obstetrics                           Anesthesia Physical Anesthesia Plan  ASA: II  Anesthesia Plan: MAC   Post-op Pain Management:    Induction: Intravenous  Airway Management Planned:   Additional Equipment:   Intra-op Plan:   Post-operative Plan:   Informed Consent: I have reviewed the patients History and Physical, chart, labs and discussed the procedure including the risks, benefits and alternatives for the proposed anesthesia with the patient or authorized representative who has indicated his/her understanding and acceptance.     Plan Discussed with: CRNA  Anesthesia Plan Comments:         Anesthesia Quick Evaluation

## 2014-11-22 NOTE — Discharge Instructions (Signed)
[facility] °[facility] °ENDOSCOPIC SINUS SURGERY °[facility] ° °What is Functional Endoscopic Sinus Surgery? ° The Surgery involves making the natural openings of the sinuses larger by removing the bony partitions that separate the sinuses from the nasal cavity.  The natural sinus lining is preserved as much as possible to allow the sinuses to resume normal function after the surgery.  In some patients nasal polyps (excessively swollen lining of the sinuses) may be removed to relieve obstruction of the sinus openings.  The surgery is performed through the nose using lighted scopes, which eliminates the need for incisions on the face.  A septoplasty is a different procedure which is sometimes performed with sinus surgery.  It involves straightening the boy partition that separates the two sides of your nose.  A crooked or deviated septum may need repair if is obstructing the sinuses or nasal airflow.  Turbinate reduction is also often performed during sinus surgery.  The turbinates are bony proturberances from the side walls of the nose which swell and can obstruct the nose in patients with sinus and allergy problems.  Their size can be surgically reduced to help relieve nasal obstruction. ° °What Can Sinus Surgery Do For Me? ° Sinus surgery can reduce the frequency of sinus infections requiring antibiotic treatment.  This can provide improvement in nasal congestion, post-nasal drainage, facial pressure and nasal obstruction.  Surgery will NOT prevent you from ever having an infection again, so it usually only for patients who get infections 4 or more times yearly requiring antibiotics, or for infections that do not clear with antibiotics.  It will not cure nasal allergies, so patients with allergies may still require medication to treat their allergies after surgery. Surgery may improve headaches related to sinusitis, however, some people will continue to  require medication to control sinus headaches related to allergies.  Surgery will do nothing for other forms of headache (migraine, tension or cluster). ° °What Are the Risks of Endoscopic Sinus Surgery? ° Current techniques allow surgery to be performed safely with little risk, however, there are rare complications that patients should be aware of.  Because the sinuses are located around the eyes, there is risk of eye injury, including blindness, though again, this would be quite rare. This is usually a result of bleeding behind the eye during surgery, which puts the vision oat risk, though there are treatments to protect the vision and prevent permanent disrupted by surgery causing a leak of the spinal fluid that surrounds the brain.  More serious complications would include bleeding inside the brain cavity or damage to the brain.  Again, all of these complications are uncommon, and spinal fluid leaks can be safely managed surgically if they occur.  The most common complication of sinus surgery is bleeding from the nose, which may require packing or cauterization of the nose.  Continued sinus have polyps may experience recurrence of the polyps requiring revision surgery.  Alterations of sense of smell or injury to the tear ducts are also rare complications.  ° °What is the Surgery Like, and what is the Recovery? ° The Surgery usually takes a couple of hours to perform, and is usually performed under a general anesthetic (completely asleep).  Patients are usually discharged home after a couple of hours.  Sometimes during surgery it is necessary to pack the nose to control bleeding, and the packing is left in place for 24 - 48 hours, and removed by your surgeon.    If a septoplasty was performed during the procedure, there is often a splint placed which must be removed after 5-7 days.   °Discomfort: Pain is usually mild to moderate, and can be controlled by prescription pain medication or acetaminophen (Tylenol).   Aspirin, Ibuprofen (Advil, Motrin), or Naprosyn (Aleve) should be avoided, as they can cause increased bleeding.  Most patients feel sinus pressure like they have a bad head cold for several days.  Sleeping with your head elevated can help reduce swelling and facial pressure, as can ice packs over the face.  A humidifier may be helpful to keep the mucous and blood from drying in the nose.  ° °Diet: There are no specific diet restrictions, however, you should generally start with clear liquids and a light diet of bland foods because the anesthetic can cause some nausea.  Advance your diet depending on how your stomach feels.  Taking your pain medication with food will often help reduce stomach upset which pain medications can cause. ° °Nasal Saline Irrigation: It is important to remove blood clots and dried mucous from the nose as it is healing.  This is done by having you irrigate the nose at least 3 - 4 times daily with a salt water solution.  We recommend using NeilMed Sinus Rinse (available at the drug store).  Fill the squeeze bottle with the solution, bend over a sink, and insert the tip of the squeeze bottle into the nose ½ of an inch.  Point the tip of the squeeze bottle towards the inside corner of the eye on the same side your irrigating.  Squeeze the bottle and gently irrigate the nose.  If you bend forward as you do this, most of the fluid will flow back out of the nose, instead of down your throat.   The solution should be warm, near body temperature, when you irrigate.   Each time you irrigate, you should use a full squeeze bottle.  ° °Note that if you are instructed to use Nasal Steroid Sprays at any time after your surgery, irrigate with saline BEFORE using the steroid spray, so you do not wash it all out of the nose. °Another product, Nasal Saline Gel (such as AYR Nasal Saline Gel) can be applied in each nostril 3 - 4 times daily to moisture the nose and reduce scabbing or crusting. ° °Bleeding:   Bloody drainage from the nose can be expected for several days, and patients are instructed to irrigate their nose frequently with salt water to help remove mucous and blood clots.  The drainage may be dark red or brown, though some fresh blood may be seen intermittently, especially after irrigation.  Do not blow you nose, as bleeding may occur. If you must sneeze, keep your mouth open to allow air to escape through your mouth. ° °If heavy bleeding occurs: Irrigate the nose with saline to rinse out clots, then spray the nose 3 - 4 times with Afrin Nasal Decongestant Spray.  The spray will constrict the blood vessels to slow bleeding.  Pinch the lower half of your nose shut to apply pressure, and lay down with your head elevated.  Ice packs over the nose may help as well. If bleeding persists despite these measures, you should notify your doctor.  Do not use the Afrin routinely to control nasal congestion after surgery, as it can result in worsening congestion and may affect healing.  ° ° ° °Activity: Return to work varies among patients. Most patients will be   out of work at least 5 - 7 days to recover.  Patient may return to work after they are off of narcotic pain medication, and feeling well enough to perform the functions of their job.  Patients must avoid heavy lifting (over 10 pounds) or strenuous physical for 2 weeks after surgery, so your employer may need to assign you to light duty, or keep you out of work longer if light duty is not possible.  NOTE: you should not drive, operate dangerous machinery, do any mentally demanding tasks or make any important legal or financial decisions while on narcotic pain medication and recovering from the general anesthetic.  °  °Call Your Doctor Immediately if You Have Any of the Following: °1. Bleeding that you cannot control with the above measures °2. Loss of vision, double vision, bulging of the eye or black eyes. °3. Fever over 101 degrees °4. Neck stiffness with  severe headache, fever, nausea and change in mental state. °You are always encourage to call anytime with concerns, however, please call with requests for pain medication refills during office hours. ° °Office Endoscopy: During follow-up visits your doctor will remove any packing or splints that may have been placed and evaluate and clean your sinuses endoscopically.  Topical anesthetic will be used to make this as comfortable as possible, though you may want to take your pain medication prior to the visit.  How often this will need to be done varies from patient to patient.  After complete recovery from the surgery, you may need follow-up endoscopy from time to time, particularly if there is concern of recurrent infection or nasal polyps. ° °General Anesthesia, Care After °Refer to this sheet in the next few weeks. These instructions provide you with information on caring for yourself after your procedure. Your health care provider may also give you more specific instructions. Your treatment has been planned according to current medical practices, but problems sometimes occur. Call your health care provider if you have any problems or questions after your procedure. °WHAT TO EXPECT AFTER THE PROCEDURE °After the procedure, it is typical to experience: °· Sleepiness. °· Nausea and vomiting. °HOME CARE INSTRUCTIONS °· For the first 24 hours after general anesthesia: °¨ Have a responsible person with you. °¨ Do not drive a car. If you are alone, do not take public transportation. °¨ Do not drink alcohol. °¨ Do not take medicine that has not been prescribed by your health care provider. °¨ Do not sign important papers or make important decisions. °¨ You may resume a normal diet and activities as directed by your health care provider. °· Change bandages (dressings) as directed. °· If you have questions or problems that seem related to general anesthesia, call the hospital and ask for the anesthetist or anesthesiologist  on call. °SEEK MEDICAL CARE IF: °· You have nausea and vomiting that continue the day after anesthesia. °· You develop a rash. °SEEK IMMEDIATE MEDICAL CARE IF:  °· You have difficulty breathing. °· You have chest pain. °· You have any allergic problems. °Document Released: 05/20/2000 Document Revised: 02/16/2013 Document Reviewed: 08/27/2012 °Exit[employer]® Patient Information ©2015 [employer]. This information is not intended to replace advice given to you by your health care provider. Make sure you discuss any questions you have with your health care provider. ° °

## 2014-11-23 ENCOUNTER — Encounter: Payer: Self-pay | Admitting: Otolaryngology

## 2014-11-23 ENCOUNTER — Ambulatory Visit: Payer: BLUE CROSS/BLUE SHIELD | Admitting: Anesthesiology

## 2014-11-23 ENCOUNTER — Encounter
Admission: RE | Disposition: A | Discharge status: Home / Self Care | Payer: Self-pay | Source: Ambulatory Visit | Attending: Otolaryngology

## 2014-11-23 ENCOUNTER — Ambulatory Visit
Admission: RE | Admit: 2014-11-23 | Discharge: 2014-11-23 | Disposition: A | Discharge status: Home / Self Care | Payer: BLUE CROSS/BLUE SHIELD | Source: Ambulatory Visit | Attending: Otolaryngology | Admitting: Otolaryngology

## 2014-11-23 ENCOUNTER — Encounter: Payer: Self-pay | Admitting: Family Medicine

## 2014-11-23 DIAGNOSIS — J343 Hypertrophy of nasal turbinates: Secondary | ICD-10-CM | POA: Insufficient documentation

## 2014-11-23 DIAGNOSIS — Z79899 Other long term (current) drug therapy: Secondary | ICD-10-CM | POA: Diagnosis not present

## 2014-11-23 DIAGNOSIS — N400 Enlarged prostate without lower urinary tract symptoms: Secondary | ICD-10-CM | POA: Diagnosis not present

## 2014-11-23 DIAGNOSIS — J3489 Other specified disorders of nose and nasal sinuses: Secondary | ICD-10-CM | POA: Diagnosis not present

## 2014-11-23 DIAGNOSIS — Z9889 Other specified postprocedural states: Secondary | ICD-10-CM | POA: Insufficient documentation

## 2014-11-23 DIAGNOSIS — J342 Deviated nasal septum: Secondary | ICD-10-CM | POA: Insufficient documentation

## 2014-11-23 HISTORY — DX: Dental restoration status: Z98.811

## 2014-11-23 HISTORY — DX: Headache, unspecified: R51.9

## 2014-11-23 HISTORY — DX: Headache: R51

## 2014-11-23 HISTORY — PX: NASAL SEPTOPLASTY W/ TURBINOPLASTY: SHX2070

## 2014-11-23 SURGERY — SEPTOPLASTY, NOSE, WITH NASAL TURBINATE REDUCTION
Anesthesia: Monitor Anesthesia Care | Laterality: Bilateral | Wound class: Clean Contaminated

## 2014-11-23 MED ORDER — FENTANYL CITRATE (PF) 100 MCG/2ML IJ SOLN
25.0000 ug | INTRAMUSCULAR | Status: DC | PRN
  Administered 2014-11-23: 25 ug via INTRAVENOUS
  Administered 2014-11-23 (×2): 50 ug via INTRAVENOUS

## 2014-11-23 MED ORDER — PROMETHAZINE HCL 25 MG/ML IJ SOLN: 6.2500 mg | INTRAMUSCULAR | Status: DC | PRN

## 2014-11-23 MED ORDER — ACETAMINOPHEN 10 MG/ML IV SOLN
INTRAVENOUS | Status: DC | PRN
  Administered 2014-11-23: 1000 mg via INTRAVENOUS

## 2014-11-23 MED ORDER — MIDAZOLAM HCL 5 MG/5ML IJ SOLN
INTRAMUSCULAR | Status: DC | PRN
  Administered 2014-11-23: 2 mg via INTRAVENOUS

## 2014-11-23 MED ORDER — PROMETHAZINE HCL 12.5 MG PO TABS
12.5000 mg | ORAL_TABLET | Freq: Four times a day (QID) | ORAL | Status: DC | PRN
Start: 2014-11-23 — End: 2014-12-23

## 2014-11-23 MED ORDER — OXYMETAZOLINE HCL 0.05 % NA SOLN
NASAL | Status: DC | PRN
  Administered 2014-11-23: 4 via TOPICAL

## 2014-11-23 MED ORDER — OXYCODONE-ACETAMINOPHEN 5-325 MG PO TABS: 1.0000 | ORAL_TABLET | ORAL | Status: DC | PRN

## 2014-11-23 MED ORDER — FENTANYL CITRATE (PF) 100 MCG/2ML IJ SOLN
INTRAMUSCULAR | Status: DC | PRN
  Administered 2014-11-23: 100 ug via INTRAVENOUS

## 2014-11-23 MED ORDER — ONDANSETRON HCL 4 MG/2ML IJ SOLN
INTRAMUSCULAR | Status: DC | PRN
Start: 2014-11-23 — End: 2014-11-23
  Administered 2014-11-23: 4 mg via INTRAVENOUS

## 2014-11-23 MED ORDER — SUCCINYLCHOLINE CHLORIDE 20 MG/ML IJ SOLN
INTRAMUSCULAR | Status: DC | PRN
  Administered 2014-11-23: 100 mg via INTRAVENOUS

## 2014-11-23 MED ORDER — DEXAMETHASONE SODIUM PHOSPHATE 4 MG/ML IJ SOLN
INTRAMUSCULAR | Status: DC | PRN
  Administered 2014-11-23: 8 mg via INTRAVENOUS

## 2014-11-23 MED ORDER — OXYCODONE HCL 5 MG/5ML PO SOLN: 5.0000 mg | Freq: Once | ORAL | Status: AC | PRN

## 2014-11-23 MED ORDER — PROPOFOL 10 MG/ML IV BOLUS
INTRAVENOUS | Status: DC | PRN
  Administered 2014-11-23: 200 mg via INTRAVENOUS

## 2014-11-23 MED ORDER — LACTATED RINGERS IV SOLN
INTRAVENOUS | Status: DC
  Administered 2014-11-23: 07:00:00 via INTRAVENOUS

## 2014-11-23 MED ORDER — LIDOCAINE-EPINEPHRINE 1 %-1:100000 IJ SOLN
INTRAMUSCULAR | Status: DC | PRN
  Administered 2014-11-23: 8 mL

## 2014-11-23 MED ORDER — SULFAMETHOXAZOLE-TRIMETHOPRIM 800-160 MG PO TABS: 1.0000 | ORAL_TABLET | Freq: Two times a day (BID) | ORAL | Status: DC

## 2014-11-23 MED ORDER — OXYCODONE HCL 5 MG PO TABS
5.0000 mg | ORAL_TABLET | Freq: Once | ORAL | Status: AC | PRN
  Administered 2014-11-23: 5 mg via ORAL

## 2014-11-23 MED ORDER — LIDOCAINE HCL (CARDIAC) 20 MG/ML IV SOLN
INTRAVENOUS | Status: DC | PRN
  Administered 2014-11-23: 40 mg via INTRAVENOUS

## 2014-11-23 MED ORDER — MEPERIDINE HCL 25 MG/ML IJ SOLN: 6.2500 mg | INTRAMUSCULAR | Status: DC | PRN

## 2014-11-23 SURGICAL SUPPLY — 23 items
CANISTER SUCT 1200ML W/VALVE (MISCELLANEOUS) ×2 IMPLANT
COAG SUCT 10F 3.5MM HAND CTRL (MISCELLANEOUS) ×2 IMPLANT
DRAPE HEAD BAR (DRAPES) ×2 IMPLANT
DRESSING NASL FOAM PST OP SINU (MISCELLANEOUS) ×1 IMPLANT
DRSG NASAL FOAM POST OP SINU (MISCELLANEOUS) ×2
GLOVE EXAM LX STRL 7.5 (GLOVE) ×4 IMPLANT
NEEDLE HYPO 25GX1X1/2 BEV (NEEDLE) ×2 IMPLANT
NS IRRIG 500ML POUR BTL (IV SOLUTION) ×2 IMPLANT
PACK DRAPE NASAL/ENT (PACKS) ×2 IMPLANT
PAD GROUND ADULT SPLIT (MISCELLANEOUS) ×2 IMPLANT
PATTIES SURGICAL .5 X3 (DISPOSABLE) ×2 IMPLANT
SPLINT NASAL SEPTAL BLV .50 ST (MISCELLANEOUS) ×2 IMPLANT
SPLINT NASAL SEPTAL PRE-CUT (MISCELLANEOUS) ×2 IMPLANT
STRAP BODY AND KNEE 60X3 (MISCELLANEOUS) ×2 IMPLANT
SUT CHROMIC 4 0 RB 1X27 (SUTURE) ×2 IMPLANT
SUT ETHILON 3-0 FS-10 30 BLK (SUTURE) ×2
SUT ETHILON 4-0 (SUTURE)
SUT ETHILON 4-0 FS2 18XMFL BLK (SUTURE)
SUT PLAIN GUT 4-0 (SUTURE) ×2 IMPLANT
SUTURE EHLN 3-0 FS-10 30 BLK (SUTURE) ×1 IMPLANT
SUTURE ETHLN 4-0 FS2 18XMF BLK (SUTURE) IMPLANT
SYRINGE 10CC LL (SYRINGE) ×2 IMPLANT
TOWEL OR 17X26 4PK STRL BLUE (TOWEL DISPOSABLE) ×2 IMPLANT

## 2014-11-23 NOTE — H&P (Signed)
..  History and Physical paper copy reviewed and updated date of procedure and will be scanned into system.  

## 2014-11-23 NOTE — Op Note (Signed)
..11/23/2014  8:47 AM    Jeffery [name]  [id number]    Pre-Op Dx:  Deviated Nasal Septum, Hypertrophic Inferior Turbinates  Post-op Dx: Same  Proc: Nasal Septoplasty, Bilateral Partial Reduction Inferior Turbinates   Surg:  VAUGHT,CREIGHTON  Anes:  GOT  EBL:  88ml  Comp:  None  Findings: Severe bilateral inferior turbinate hypertrophy, left sided septal spur with impaction onto inferior turbinate  Procedure: With the patient in a comfortable supine position,  general orotracheal anesthesia was induced without difficulty.     The patient received preoperative Afrin spray for topical decongestion and vasoconstriction.  At an appropriate level, the patient was placed in a semi-sitting position.  Nasal vibrissae were trimmed.   1% Xylocaine with 1:100,000 epinephrine, 8 cc's, was infiltrated into the anterior floor of the nose, into the nasal spine region, into the membranous columella, and finally into the submucoperichondrial plane of the septum on both sides.  Several minutes were allowed for this to take effect.  Cottoniod pledgetts soaked in Afrin were placed into both nasal cavities and left while the patient was prepped and draped in the standard fashion.  The materials were removed from the nose and observed to be intact and correct in number.  The nose was inspected with a headlight and zero degree endoscope with the findings as described above.  A left Killian incision was sharply executed and carried down to the caudal edge of the quadrangular cartilage with a 15 blade scapel.  A mucoperichondrial flap was elelvated along the quadrangular plate back to the bony-cartilaginous junction using caudal elevator and freer elevator. The mucoperiostium was then elevated along the ethmoid plate and the vomer. An itracartilagenous incision was made using the freer elevator and a contralateral mucoperichondiral flap was elevated using a freer elevator.  Care was taken to avoid any large  rents or opposing rents in the mucoperichondrial flap.  Boney spurs of the vomer and maxillary crest were removed with Takahashi forceps.  The area of cartilagenous deviation was removed with combination of freer elevator and Takahashi forceps creating a widely patent nasal cavity as well as resolution of obstruction from the cartilagenous deviation. The mucosal flaps were placed back into their anatomic position to allow visualization of the airways. The septum now sat in the midline with an improved airway.  A 4-0 Chromic was used to close the [place] incision as well.   The inferior turbinates were then inspected.  Under endoscopic visualization, the inferior turbinates were infractured bilaterally with a Soil scientist.  A tonsil clamp was attached to the anterior-inferior third of each inferior turbinate for approximately one minute.  Under endoscopic visualization, Tru-cutting forceps were used to remove the anterior-inferior third of each inferior turbinate.  Electrocautery was used to control bleeding in the area. The remaining turbinate was then outfractured to open up the airway further. There was no significant bleeding noted. The right turbinate was then trimmed and outfractured in a similar fashion.  The airways were then visualized and showed open passageways on both sides that were significantly improved compared to before surgery. There was no signifcant bleeding. Nasal splints were applied to both sides of the septum using Xomed 0.61mm regular sized splints that were trimmed, and then held in position with a 3-0 Nylon through and through suture.  Stamberger sinufoam was placed along the cut edge of the inferior turbinates bilaterally.  The patient was turned back over to anesthesia, and awakened, extubated, and taken to the PACU in satisfactory condition.  Dispo:   PACU to home  Plan: Ice, elevation, narcotic analgesia, and prophylactic antibiotics for the duration of indwelling nasal  foreign bodies.  We will reevaluate the patient in the office in 6 days and remove the septal splints.  Return to work in 14 days, strenuous activities in two weeks.   VAUGHT,CREIGHTON 11/23/2014 8:47 AM

## 2014-11-23 NOTE — Anesthesia Procedure Notes (Signed)
Procedure Name: Intubation Date/Time: 11/23/2014 7:37 AM Performed by: Londell Moh Pre-anesthesia Checklist: Patient identified, Emergency Drugs available, Suction available, Patient being monitored and Timeout performed Patient Re-evaluated:Patient Re-evaluated prior to inductionOxygen Delivery Method: Circle system utilized Preoxygenation: Pre-oxygenation with 100% oxygen Intubation Type: IV induction Ventilation: Mask ventilation without difficulty Laryngoscope Size: Mac and 3 Grade View: Grade I Tube type: Oral Rae Tube size: 7.0 mm Number of attempts: 1 Placement Confirmation: ETT inserted through vocal cords under direct vision,  positive ETCO2 and breath sounds checked- equal and bilateral Tube secured with: Tape Dental Injury: Teeth and Oropharynx as per pre-operative assessment

## 2014-11-23 NOTE — Transfer of Care (Signed)
Immediate Anesthesia Transfer of Care Note  Patient: Jeffery [name]  Procedure(s) Performed: Procedure(s): NASAL SEPTOPLASTY WITH TURBINATE REDUCTION (Bilateral)  Patient Location: PACU  Anesthesia Type: MAC  Level of Consciousness: awake, alert  and patient cooperative  Airway and Oxygen Therapy: Patient Spontanous Breathing and Patient connected to supplemental oxygen  Post-op Assessment: Post-op Vital signs reviewed, Patient's Cardiovascular Status Stable, Respiratory Function Stable, Patent Airway and No signs of Nausea or vomiting  Post-op Vital Signs: Reviewed and stable  Complications: No apparent anesthesia complications

## 2014-11-23 NOTE — Anesthesia Postprocedure Evaluation (Signed)
  Anesthesia Post-op Note  Patient: Jeffery [name]  Procedure(s) Performed: Procedure(s): NASAL SEPTOPLASTY WITH TURBINATE REDUCTION (Bilateral)  Anesthesia type:MAC  Patient location: PACU  Post pain: Pain level controlled  Post assessment: Post-op Vital signs reviewed, Patient's Cardiovascular Status Stable, Respiratory Function Stable, Patent Airway and No signs of Nausea or vomiting  Post vital signs: Reviewed and stable  Last Vitals:  Filed Vitals:   11/23/14 0915  BP: 112/79  Pulse: 80  Temp:   Resp: 18    Level of consciousness: awake, alert  and patient cooperative  Complications: No apparent anesthesia complications

## 2014-12-08 ENCOUNTER — Encounter: Payer: Self-pay | Admitting: Family Medicine

## 2014-12-13 ENCOUNTER — Encounter: Payer: Self-pay | Admitting: Family Medicine

## 2014-12-23 ENCOUNTER — Ambulatory Visit (INDEPENDENT_AMBULATORY_CARE_PROVIDER_SITE_OTHER): Payer: BLUE CROSS/BLUE SHIELD | Admitting: Family Medicine

## 2014-12-23 ENCOUNTER — Encounter: Payer: Self-pay | Admitting: Family Medicine

## 2014-12-23 VITALS — BP 118/80 | HR 88 | Temp 97.9°F | Resp 18 | Ht 71.0 in | Wt 220.0 lb

## 2014-12-23 DIAGNOSIS — M546 Pain in thoracic spine: Secondary | ICD-10-CM

## 2014-12-23 DIAGNOSIS — M549 Dorsalgia, unspecified: Secondary | ICD-10-CM

## 2014-12-23 DIAGNOSIS — Z0000 Encounter for general adult medical examination without abnormal findings: Secondary | ICD-10-CM | POA: Insufficient documentation

## 2014-12-23 MED ORDER — METAXALONE 800 MG PO TABS: 800.0000 mg | ORAL_TABLET | Freq: Three times a day (TID) | ORAL | Status: DC

## 2014-12-23 NOTE — Progress Notes (Signed)
Name: Jeffery [name]   MRN: [id number]    DOB: 22-Apr-1970   Date:12/23/2014       Progress Note  Subjective  Chief Complaint  Chief Complaint  Patient presents with  . Annual Exam    HPI  Pt. Is here for a Complete Physical Exam. He is doing well, has some back pain since this AM. Recently had cystoscopy for BPH.  Past Medical History  Diagnosis Date  . Testosterone deficiency   . Enlarged prostate   . Umbilical hernia   . Dental crowns present     sides and back  . Headache     sinus  . Obesity   . Dyslipidemia   . Nodular prostate with lower urinary tract symptoms   . Hypotestosteronism   . Umbilical hernia without obstruction or gangrene   . Epididymitis     Past Surgical History  Procedure Laterality Date  . Wrist fracture surgery Right   . Prostate biopsy  2015  . Hernia repair  1/95/09    umbilical hernia  . Nasal septoplasty w/ turbinoplasty Bilateral 11/23/2014    Procedure: NASAL SEPTOPLASTY WITH TURBINATE REDUCTION;  Surgeon: Carloyn Manner, MD;  Location: [facility];  Service: ENT;  Laterality: Bilateral;    Family History  Problem Relation Age of Onset  . Diabetes Mother   . Hypertension Father   . Cancer Maternal Grandmother     Lung-  Non-Smoker    Social History   Social History  . Marital Status: Single    Spouse Name: N/A  . Number of Children: N/A  . Years of Education: N/A   Occupational History  . Not on file.   Social History Main Topics  . Smoking status: Former Smoker -- 0.50 packs/day for 20 years  . Smokeless tobacco: Not on file     Comment: Uses E-Cigarettes  . Alcohol Use: No  . Drug Use: No  . Sexual Activity: Yes   Other Topics Concern  . Not on file   Social History Narrative     Current outpatient prescriptions:  .  tadalafil (CIALIS) 5 MG tablet, Take 5 mg by mouth daily as needed for erectile dysfunction., Disp: , Rfl:  .  ANDROGEL PUMP 20.25 MG/ACT (1.62%) GEL, Apply 1 application topically  daily., Disp: , Rfl:  .  MULTIPLE VITAMIN IV, Take by mouth., Disp: , Rfl:   No Known Allergies   Review of Systems  Constitutional: Positive for malaise/fatigue. Negative for fever, chills and weight loss.  HENT: Negative for congestion, ear pain and sore throat.   Eyes: Negative for blurred vision, double vision and pain.  Respiratory: Negative for cough, shortness of breath and wheezing.   Cardiovascular: Negative for chest pain and leg swelling.  Gastrointestinal: Negative for heartburn, nausea, vomiting, abdominal pain, diarrhea, constipation and blood in stool.  Genitourinary: Negative for dysuria, urgency and hematuria.  Musculoskeletal: Positive for back pain. Negative for myalgias.  Skin: Negative for itching and rash.  Neurological: Positive for headaches (especially in AM.). Negative for dizziness and weakness.  Endo/Heme/Allergies: Negative for environmental allergies.  Psychiatric/Behavioral: Negative for depression and hallucinations. The patient is not nervous/anxious and does not have insomnia.     Objective  Filed Vitals:   12/23/14 1105  BP: 118/80  Pulse: 88  Temp: 97.9 F (36.6 C)  Resp: 18  Height: 5\' 11"  (1.803 m)  Weight: 220 lb (99.791 kg)  SpO2: 97%    Physical Exam  Constitutional: He is oriented to person,  place, and time and well-developed, well-nourished, and in no distress.  HENT:  Head: Normocephalic and atraumatic.  Eyes: Pupils are equal, round, and reactive to light.  Neck: Normal range of motion. Neck supple.  Cardiovascular: Normal rate, regular rhythm and normal heart sounds.   Pulmonary/Chest: Effort normal and breath sounds normal. He has no wheezes. He has no rales.  Abdominal: Soft. Bowel sounds are normal. He exhibits no distension. There is no tenderness.  Genitourinary:  deferred  Musculoskeletal:       Cervical back: He exhibits tenderness. He exhibits no swelling and no pain.       Back:  Tenderness to palpation over the  left upper back, alongside and medial to the scapula.  Neurological: He is alert and oriented to person, place, and time.  Skin: Skin is warm and dry.  Psychiatric: Mood, memory, affect and judgment normal.  Nursing note and vitals reviewed.   Assessment & Plan  1. Acute upper back pain Likely a muscular sprain. Will start on Skelaxin. Advised to use heat as needed. - metaxalone (SKELAXIN) 800 MG tablet; Take 1 tablet (800 mg total) by mouth 3 (three) times daily.  Dispense: 15 tablet; Refill: 0  2. Annual physical exam  - Lipid Profile - COMPLETE METABOLIC PANEL WITH GFR - TSH - Vitamin D (25 hydroxy) - HgB A1c   [name] 12/23/2014 11:38 AM

## 2015-01-16 ENCOUNTER — Encounter: Payer: Self-pay | Admitting: Family Medicine

## 2015-01-16 ENCOUNTER — Ambulatory Visit (INDEPENDENT_AMBULATORY_CARE_PROVIDER_SITE_OTHER): Payer: BLUE CROSS/BLUE SHIELD | Admitting: Family Medicine

## 2015-01-16 ENCOUNTER — Other Ambulatory Visit: Payer: Self-pay | Admitting: Family Medicine

## 2015-01-16 VITALS — BP 120/70 | HR 110 | Temp 98.3°F | Resp 16 | Ht 71.0 in | Wt 223.5 lb

## 2015-01-16 DIAGNOSIS — Z720 Tobacco use: Secondary | ICD-10-CM

## 2015-01-16 DIAGNOSIS — J0110 Acute frontal sinusitis, unspecified: Secondary | ICD-10-CM | POA: Diagnosis not present

## 2015-01-16 DIAGNOSIS — G44221 Chronic tension-type headache, intractable: Secondary | ICD-10-CM

## 2015-01-16 DIAGNOSIS — F419 Anxiety disorder, unspecified: Secondary | ICD-10-CM

## 2015-01-16 DIAGNOSIS — Z716 Tobacco abuse counseling: Secondary | ICD-10-CM

## 2015-01-16 MED ORDER — AMOXICILLIN-POT CLAVULANATE 875-125 MG PO TABS: 1.0000 | ORAL_TABLET | Freq: Two times a day (BID) | ORAL | Status: DC

## 2015-01-16 MED ORDER — BUPROPION HCL 100 MG PO TABS: 100.0000 mg | ORAL_TABLET | Freq: Two times a day (BID) | ORAL | Status: DC

## 2015-01-16 MED ORDER — CYCLOBENZAPRINE HCL 5 MG PO TABS: 5.0000 mg | ORAL_TABLET | Freq: Two times a day (BID) | ORAL | Status: DC

## 2015-01-16 MED ORDER — PREDNISONE 20 MG PO TABS: 20.0000 mg | ORAL_TABLET | Freq: Two times a day (BID) | ORAL | Status: DC

## 2015-01-16 MED ORDER — KETOROLAC TROMETHAMINE 10 MG PO TABS: 10.0000 mg | ORAL_TABLET | Freq: Three times a day (TID) | ORAL | Status: DC | PRN

## 2015-01-16 NOTE — Progress Notes (Signed)
Name: Jeffery [name]   MRN: [id number]    DOB: 1971-02-02   Date:01/16/2015       Progress Note  Subjective  Chief Complaint  Chief Complaint  Patient presents with  . Headache    for 2 weeks    HPI  Headaches  Patient presents with a 2 week history of headaches. He's had headaches intermittently in the past as well. His current headaches have been located in the frontal area and also in the occipital and cervical area. There's been no nuchal rigidity. There's been no fever or chills. There is been no history of any tick bites. There is no photophobia via or phonophobia. There is no history of any migraine headaches in the family.  Sinusitis  Patient complains of sinus congestion and drainage at times purulent. He has recently has a nasal septoplasty performed by his ENT. No fever or chills. The symptoms have been present for 2-3 weeks.  Tobacco abuse  Patient has a long-standing history of tobacco usage. He is smoking about 1 pack per day. He has use of his girlfriends Wellbutrin and states that he has seen some improvement with that.  Anxiety patient has a history of recurrent anxiety. Wishes to have some medication for this. Anxiety as manifested by racing thoughts and shaky palms and palpitations at times. He is use his friends but Wellbutrin as noted above and is noted both improvement in his anxiety and his desire for smoking at times.     Past Medical History  Diagnosis Date  . Testosterone deficiency   . Enlarged prostate   . Umbilical hernia   . Dental crowns present     sides and back  . Headache     sinus  . Obesity   . Dyslipidemia   . Nodular prostate with lower urinary tract symptoms   . Hypotestosteronism   . Umbilical hernia without obstruction or gangrene   . Epididymitis     Social History  Substance Use Topics  . Smoking status: Former Smoker -- 0.50 packs/day for 20 years  . Smokeless tobacco: Not on file     Comment: Uses E-Cigarettes  .  Alcohol Use: No     Current outpatient prescriptions:  .  ANDROGEL PUMP 20.25 MG/ACT (1.62%) GEL, Apply 1 application topically daily., Disp: , Rfl:  .  MULTIPLE VITAMIN IV, Take by mouth., Disp: , Rfl:  .  tadalafil (CIALIS) 5 MG tablet, Take 5 mg by mouth daily as needed for erectile dysfunction., Disp: , Rfl:   No Known Allergies  Review of Systems  Constitutional: Negative.  Negative for fever, chills and weight loss.  HENT: Positive for congestion (. Nasal discharge.). Negative for hearing loss, sore throat and tinnitus.   Eyes: Negative for blurred vision, double vision, photophobia and redness.  Respiratory: Negative for cough, hemoptysis and shortness of breath.   Cardiovascular: Negative for chest pain, palpitations, orthopnea, claudication and leg swelling.  Gastrointestinal: Negative for heartburn, nausea, vomiting, diarrhea, constipation and blood in stool.  Genitourinary: Negative for dysuria, urgency, frequency and hematuria.  Musculoskeletal: Positive for neck pain. Negative for myalgias, back pain, joint pain and falls.  Skin: Negative for itching.  Neurological: Positive for headaches. Negative for dizziness, tingling, tremors, sensory change, focal weakness, seizures, loss of consciousness and weakness.  Endo/Heme/Allergies: Does not bruise/bleed easily.  Psychiatric/Behavioral: Positive for substance abuse (chronic tobacco abuse up to pack per day). Negative for depression. The patient is nervous/anxious. The patient does not have insomnia.  Objective  Filed Vitals:   01/16/15 1357  BP: 120/70  Pulse: 110  Temp: 98.3 F (36.8 C)  TempSrc: Oral  Resp: 16  Height: 5\' 11"  (1.803 m)  Weight: 223 lb 8 oz (101.379 kg)  SpO2: 95%     Physical Exam  Constitutional: He is oriented to person, place, and time and well-developed, well-nourished, and in no distress.  HENT:  Nasal turbinate swelling with purulent discharge  Tenderness over the frontal and  maxillary sinuses  Eyes: EOM are normal. Pupils are equal, round, and reactive to light.  Neck: Normal range of motion. Neck supple. No thyromegaly present.  Cardiovascular: Normal rate, regular rhythm and normal heart sounds.   No murmur heard. Pulmonary/Chest: Effort normal and breath sounds normal. No respiratory distress. He has no wheezes.  Abdominal: Soft. Bowel sounds are normal.  Musculoskeletal: He exhibits tenderness. He exhibits no edema.  Tenderness in the occipital sternomastoid area.  No nuchal rigidity  Lymphadenopathy:    He has no cervical adenopathy.  Neurological: He is alert and oriented to person, place, and time. No cranial nerve deficit. Gait normal. Coordination normal.  Skin: Skin is warm and dry. No rash noted.  Psychiatric: Judgment normal.  Anxious and a bit loquacious      Assessment & Plan    1. Chronic tension-type headache, intractable May need to consider MRI in the future if headache persists - predniSONE (DELTASONE) 20 MG tablet; Take 1 tablet (20 mg total) by mouth 2 (two) times daily with a meal.  Dispense: 10 tablet; Refill: 0 - cyclobenzaprine (FLEXERIL) 5 MG tablet; Take 1 tablet (5 mg total) by mouth 2 (two) times daily before a meal.  Dispense: 60 tablet; Refill: 0 - ketorolac (TORADOL) 10 MG tablet; Take 1 tablet (10 mg total) by mouth every 8 (eight) hours as needed (with food).  Dispense: 30 tablet; Refill: 1  2. Subacute frontal sinusitis  - predniSONE (DELTASONE) 20 MG tablet; Take 1 tablet (20 mg total) by mouth 2 (two) times daily with a meal.  Dispense: 10 tablet; Refill: 0 - amoxicillin-clavulanate (AUGMENTIN) 875-125 MG tablet; Take 1 tablet by mouth 2 (two) times daily.  Dispense: 20 tablet; Refill: 0  3. Tobacco abuse Trial of Wellbutrin - buPROPion (WELLBUTRIN) 100 MG tablet; Take 1 tablet (100 mg total) by mouth 2 (two) times daily.  Dispense: 60 tablet; Refill: 1  4. Encounter for smoking cessation counseling  -  buPROPion (WELLBUTRIN) 100 MG tablet; Take 1 tablet (100 mg total) by mouth 2 (two) times daily.  Dispense: 60 tablet; Refill: 1  5. Acute anxiety  - buPROPion (WELLBUTRIN) 100 MG tablet; Take 1 tablet (100 mg total) by mouth 2 (two) times daily.  Dispense: 60 tablet; Refill: 1

## 2015-01-16 NOTE — Patient Instructions (Signed)
Tension Headache A tension headache is a feeling of pain, pressure, or aching that is often felt over the front and sides of the head. The pain can be dull, or it can feel tight (constricting). Tension headaches are not normally associated with nausea or vomiting, and they do not get worse with physical activity. Tension headaches can last from 30 minutes to several days. This is the most common type of headache. CAUSES The exact cause of this condition is not known. Tension headaches often begin after stress, anxiety, or depression. Other triggers may include:  Alcohol.  Too much caffeine, or caffeine withdrawal.  Respiratory infections, such as colds, flu, or sinus infections.  Dental problems or teeth clenching.  Fatigue.  Holding your head and neck in the same position for a long period of time, such as while using a computer.  Smoking. SYMPTOMS Symptoms of this condition include:  A feeling of pressure around the head.  Dull, aching head pain.  Pain felt over the front and sides of the head.  Tenderness in the muscles of the head, neck, and shoulders. DIAGNOSIS This condition may be diagnosed based on your symptoms and a physical exam. Tests may be done, such as a CT scan or an MRI of your head. These tests may be done if your symptoms are severe or unusual. TREATMENT This condition may be treated with lifestyle changes and medicines to help relieve symptoms. HOME CARE INSTRUCTIONS Managing Pain  Take over-the-counter and prescription medicines only as told by your health care provider.  Lie down in a dark, quiet room when you have a headache.  If directed, apply ice to the head and neck area:  Put ice in a plastic bag.  Place a towel between your skin and the bag.  Leave the ice on for 20 minutes, 2-3 times per day.  Use a heating pad or a hot shower to apply heat to the head and neck area as told by your health care provider. Eating and Drinking  Eat meals on  a regular schedule.  Limit alcohol use.  Decrease your caffeine intake, or stop using caffeine. General Instructions  Keep all follow-up visits as told by your health care provider. This is important.  Keep a headache journal to help find out what may trigger your headaches. For example, write down:  What you eat and drink.  How much sleep you get.  Any change to your diet or medicines.  Try massage or other relaxation techniques.  Limit stress.  Sit up straight, and avoid tensing your muscles.  Do not use tobacco products, including cigarettes, chewing tobacco, or e-cigarettes. If you need help quitting, ask your health care provider.  Exercise regularly as told by your health care provider.  Get 7-9 hours of sleep, or the amount recommended by your health care provider. SEEK MEDICAL CARE IF:  Your symptoms are not helped by medicine.  You have a headache that is different from what you normally experience.  You have nausea or you vomit.  You have a fever. SEEK IMMEDIATE MEDICAL CARE IF:  Your headache becomes severe.  You have repeated vomiting.  You have a stiff neck.  You have a loss of vision.  You have problems with speech.  You have pain in your eye or ear.  You have muscular weakness or loss of muscle control.  You lose your balance or you have trouble walking.  You feel faint or you pass out.  You have confusion.     This information is not intended to replace advice given to you by your health care provider. Make sure you discuss any questions you have with your health care provider.   Document Released: 02/11/2005 Document Revised: 11/02/2014 Document Reviewed: 06/06/2014 Elsevier Interactive Patient Education 2016 [employer]  

## 2015-01-18 ENCOUNTER — Other Ambulatory Visit: Payer: Self-pay

## 2015-01-18 NOTE — Telephone Encounter (Signed)
Patient requesting refill. 

## 2015-01-23 MED ORDER — TESTOSTERONE 20.25 MG/ACT (1.62%) TD GEL: 1.0000 "application " | Freq: Every day | TRANSDERMAL | Status: DC

## 2015-01-27 ENCOUNTER — Ambulatory Visit: Payer: BLUE CROSS/BLUE SHIELD | Attending: Otolaryngology

## 2015-01-27 DIAGNOSIS — G4733 Obstructive sleep apnea (adult) (pediatric): Secondary | ICD-10-CM | POA: Diagnosis not present

## 2015-02-16 ENCOUNTER — Ambulatory Visit: Payer: BLUE CROSS/BLUE SHIELD | Admitting: Family Medicine

## 2015-03-01 ENCOUNTER — Telehealth: Payer: Self-pay

## 2015-03-01 NOTE — Telephone Encounter (Signed)
Patient stated that he got his flu shot at [facility] around 10/2014.

## 2015-03-10 ENCOUNTER — Encounter: Payer: Self-pay | Admitting: Family Medicine

## 2015-03-10 ENCOUNTER — Ambulatory Visit (INDEPENDENT_AMBULATORY_CARE_PROVIDER_SITE_OTHER): Payer: BLUE CROSS/BLUE SHIELD | Admitting: Family Medicine

## 2015-03-10 ENCOUNTER — Telehealth: Payer: Self-pay

## 2015-03-10 VITALS — BP 116/76 | HR 110 | Temp 98.2°F | Resp 18 | Wt 228.5 lb

## 2015-03-10 DIAGNOSIS — E669 Obesity, unspecified: Secondary | ICD-10-CM | POA: Insufficient documentation

## 2015-03-10 DIAGNOSIS — N401 Enlarged prostate with lower urinary tract symptoms: Secondary | ICD-10-CM

## 2015-03-10 DIAGNOSIS — N138 Other obstructive and reflux uropathy: Secondary | ICD-10-CM | POA: Insufficient documentation

## 2015-03-10 DIAGNOSIS — J0100 Acute maxillary sinusitis, unspecified: Secondary | ICD-10-CM

## 2015-03-10 DIAGNOSIS — R5382 Chronic fatigue, unspecified: Secondary | ICD-10-CM | POA: Diagnosis not present

## 2015-03-10 DIAGNOSIS — N529 Male erectile dysfunction, unspecified: Secondary | ICD-10-CM | POA: Insufficient documentation

## 2015-03-10 DIAGNOSIS — F1120 Opioid dependence, uncomplicated: Secondary | ICD-10-CM | POA: Insufficient documentation

## 2015-03-10 DIAGNOSIS — Z6834 Body mass index (BMI) 34.0-34.9, adult: Secondary | ICD-10-CM | POA: Insufficient documentation

## 2015-03-10 DIAGNOSIS — E785 Hyperlipidemia, unspecified: Secondary | ICD-10-CM | POA: Insufficient documentation

## 2015-03-10 LAB — HEPATIC FUNCTION PANEL
ALT: 11 U/L (ref 10–40)
AST: 22 U/L (ref 14–40)

## 2015-03-10 LAB — LIPID PANEL
CHOLESTEROL: 204 mg/dL — AB (ref 0–200)
HDL: 29 mg/dL — AB (ref 35–70)
LDL CALC: 131 mg/dL
TRIGLYCERIDES: 219 mg/dL — AB (ref 40–160)

## 2015-03-10 LAB — BASIC METABOLIC PANEL: Glucose: 108 mg/dL

## 2015-03-10 LAB — HEMOGLOBIN A1C: Hemoglobin A1C: 5.4

## 2015-03-10 LAB — TSH: TSH: 0.8 u[IU]/mL (ref 0.41–5.90)

## 2015-03-10 MED ORDER — AZITHROMYCIN 250 MG PO TABS: ORAL_TABLET | ORAL | Status: DC

## 2015-03-10 MED ORDER — PREDNISONE 20 MG PO TABS: 20.0000 mg | ORAL_TABLET | Freq: Every day | ORAL | Status: DC

## 2015-03-10 MED ORDER — BENZONATATE 100 MG PO CAPS: 100.0000 mg | ORAL_CAPSULE | Freq: Two times a day (BID) | ORAL | Status: DC | PRN

## 2015-03-10 MED ORDER — SUMATRIPTAN SUCCINATE 100 MG PO TABS: 100.0000 mg | ORAL_TABLET | ORAL | Status: DC | PRN

## 2015-03-10 NOTE — Progress Notes (Signed)
Name: Jeffery [name]   MRN: [id number]    DOB: 05/18/1970   Date:03/10/2015       Progress Note  Subjective  Chief Complaint  Chief Complaint  Patient presents with  . Cough  . Sinusitis    HPI  Sinusitis  Patient presents with greater than 7 day history of nasal congestion and drainage which is purulent in color. There is tenderness over the sinuses. There has been fever to - along with some associated chills on occasion. Usage of over-the-counter medications is not been affected. There is also accompanying cough productive of purulent sputum.  Past Medical History  Diagnosis Date  . Testosterone deficiency   . Enlarged prostate   . Umbilical hernia   . Dental crowns present     sides and back  . Headache     sinus  . Obesity   . Dyslipidemia   . Nodular prostate with lower urinary tract symptoms   . Hypotestosteronism   . Umbilical hernia without obstruction or gangrene   . Epididymitis     Social History  Substance Use Topics  . Smoking status: Former Smoker -- 0.50 packs/day for 20 years  . Smokeless tobacco: Not on file     Comment: Uses E-Cigarettes  . Alcohol Use: No     Current outpatient prescriptions:  .  amoxicillin-clavulanate (AUGMENTIN) 875-125 MG tablet, Take 1 tablet by mouth 2 (two) times daily., Disp: 20 tablet, Rfl: 0 .  buPROPion (WELLBUTRIN) 100 MG tablet, Take 1 tablet (100 mg total) by mouth 2 (two) times daily., Disp: 60 tablet, Rfl: 1 .  cyclobenzaprine (FLEXERIL) 5 MG tablet, Take 1 tablet (5 mg total) by mouth 2 (two) times daily before a meal., Disp: 60 tablet, Rfl: 0 .  ketorolac (TORADOL) 10 MG tablet, Take 1 tablet (10 mg total) by mouth every 8 (eight) hours as needed (with food)., Disp: 30 tablet, Rfl: 1 .  MULTIPLE VITAMIN IV, Take by mouth., Disp: , Rfl:  .  predniSONE (DELTASONE) 20 MG tablet, Take 1 tablet (20 mg total) by mouth 2 (two) times daily with a meal., Disp: 10 tablet, Rfl: 0 .  tadalafil (CIALIS) 5 MG tablet, Take 5 mg  by mouth daily as needed for erectile dysfunction., Disp: , Rfl:  .  Testosterone (ANDROGEL PUMP) 20.25 MG/ACT (1.62%) GEL, Apply 1 application topically daily., Disp: 75 g, Rfl: 0  No Known Allergies  Review of Systems  Constitutional: Negative for fever, chills and weight loss.  HENT: Positive for congestion. Negative for hearing loss, sore throat and tinnitus.   Eyes: Negative for blurred vision, double vision and redness.  Respiratory: Positive for cough. Negative for hemoptysis and shortness of breath.   Cardiovascular: Negative for chest pain, palpitations, orthopnea, claudication and leg swelling.  Gastrointestinal: Negative for heartburn, nausea, vomiting, diarrhea, constipation and blood in stool.  Genitourinary: Negative for dysuria, urgency, frequency and hematuria.  Musculoskeletal: Negative for myalgias, back pain, joint pain, falls and neck pain.  Skin: Negative for itching.  Neurological: Negative for dizziness, tingling, tremors, focal weakness, seizures, loss of consciousness, weakness and headaches.  Endo/Heme/Allergies: Does not bruise/bleed easily.  Psychiatric/Behavioral: Negative for depression and substance abuse. The patient is not nervous/anxious and does not have insomnia.      Objective  Filed Vitals:   03/10/15 1104  BP: 116/76  Pulse: 110  Temp: 98.2 F (36.8 C)  Resp: 18  Weight: 228 lb 8 oz (103.647 kg)  SpO2: 96%     Physical Exam  Constitutional: He is oriented to person, place, and time and well-developed, well-nourished, and in no distress.  HENT:  Head: Normocephalic.  Eyes: EOM are normal. Pupils are equal, round, and reactive to light.  Neck: Normal range of motion. Neck supple. No thyromegaly present.  Cardiovascular: Normal rate, regular rhythm and normal heart sounds.   No murmur heard. Pulmonary/Chest: Effort normal and breath sounds normal. No respiratory distress. He has no wheezes.  Abdominal: Soft. Bowel sounds are normal.   Musculoskeletal: Normal range of motion. He exhibits no edema.  Lymphadenopathy:    He has no cervical adenopathy.  Neurological: He is alert and oriented to person, place, and time. No cranial nerve deficit. Gait normal. Coordination normal.  Skin: Skin is warm and dry. No rash noted.  Psychiatric: Affect and judgment normal.      Assessment & Plan   1. Chronic fatigue  Differential includes B12 deficiency anemia, iron deficiency anemia, hypothyroidism, depression. Also must consider this as B12 level Obtain B12 level - B12  2. Subacute maxillary sinusitis Steroids and antibiotics - predniSONE (DELTASONE) 20 MG tablet; Take 1 tablet (20 mg total) by mouth daily with breakfast.  Dispense: 10 tablet; Refill: 0 - benzonatate (TESSALON) 100 MG capsule; Take 1 capsule (100 mg total) by mouth 2 (two) times daily as needed for cough.  Dispense: 20 capsule; Refill: 0 - azithromycin (ZITHROMAX) 250 MG tablet; Follow package instructions  Dispense: 6 each; Refill: 0

## 2015-03-10 NOTE — Telephone Encounter (Signed)
[facility] called this am for lab order for patients blood work from Oct.  They state the [place] encounter form will not work we need to use there forms or write on rx pad.  I was able to give a verbal order this time.  Patient also came in today to see Dr. Rutherford Nail and he wanted to add on a B12.  I called [facility] [phone] and gave verbal addon.

## 2015-03-13 ENCOUNTER — Telehealth: Payer: Self-pay

## 2015-03-13 NOTE — Telephone Encounter (Signed)
I think he sees you Dr. Rutherford Nail

## 2015-03-13 NOTE — Telephone Encounter (Signed)
Patient called and stated he had labs done on Thursday 03/08/2014 from [facility], when he seen Dr. Manuella Ghazi in October 2016 he ordered them and just had them done. But wanted to call and see if you gotten them yet and we could give him his results. I informed him I did not see them but will look out for his results and would sent you a message to see if you have reviewed them.

## 2015-03-14 ENCOUNTER — Other Ambulatory Visit: Payer: Self-pay | Admitting: Family Medicine

## 2015-03-14 ENCOUNTER — Telehealth: Payer: Self-pay

## 2015-03-14 DIAGNOSIS — R5383 Other fatigue: Secondary | ICD-10-CM

## 2015-03-14 NOTE — Telephone Encounter (Signed)
Patient migraines has been going on for 6-7 months and would like a referral to Neurology. States he has seen Dr. Rutherford Nail the last couple of times he came in and was given Imitrex. But still having migraines quite frequently and has even tried otc medication with no relief.

## 2015-03-16 NOTE — Telephone Encounter (Signed)
Have not seen the results

## 2015-03-20 ENCOUNTER — Telehealth: Payer: Self-pay | Admitting: Emergency Medicine

## 2015-03-20 DIAGNOSIS — Z8669 Personal history of other diseases of the nervous system and sense organs: Secondary | ICD-10-CM

## 2015-03-24 ENCOUNTER — Ambulatory Visit: Payer: BLUE CROSS/BLUE SHIELD | Admitting: Family Medicine

## 2015-04-15 ENCOUNTER — Other Ambulatory Visit: Payer: Self-pay | Admitting: Family Medicine

## 2015-04-17 NOTE — Telephone Encounter (Signed)
Left voice message to schedule appointment and that prescription was at the pharmacy

## 2015-05-30 ENCOUNTER — Other Ambulatory Visit: Payer: Self-pay | Admitting: Family Medicine

## 2015-06-01 DIAGNOSIS — B9689 Other specified bacterial agents as the cause of diseases classified elsewhere: Secondary | ICD-10-CM | POA: Diagnosis not present

## 2015-06-01 DIAGNOSIS — J019 Acute sinusitis, unspecified: Secondary | ICD-10-CM | POA: Diagnosis not present

## 2015-07-21 ENCOUNTER — Encounter: Payer: Self-pay | Admitting: Family Medicine

## 2015-07-31 ENCOUNTER — Ambulatory Visit (INDEPENDENT_AMBULATORY_CARE_PROVIDER_SITE_OTHER): Payer: BLUE CROSS/BLUE SHIELD | Admitting: Family Medicine

## 2015-07-31 ENCOUNTER — Encounter: Payer: Self-pay | Admitting: Family Medicine

## 2015-07-31 ENCOUNTER — Telehealth: Payer: Self-pay

## 2015-07-31 VITALS — BP 102/80 | HR 94 | Temp 98.5°F | Resp 18 | Ht 71.0 in | Wt 213.2 lb

## 2015-07-31 DIAGNOSIS — N411 Chronic prostatitis: Secondary | ICD-10-CM

## 2015-07-31 LAB — POCT URINALYSIS DIPSTICK
Bilirubin, UA: NEGATIVE
Blood, UA: NEGATIVE
Glucose, UA: NEGATIVE
Leukocytes, UA: NEGATIVE
Nitrite, UA: NEGATIVE
PH UA: 8
PROTEIN UA: NEGATIVE
SPEC GRAV UA: 1.025
Urobilinogen, UA: NEGATIVE

## 2015-07-31 MED ORDER — OFLOXACIN 300 MG PO TABS: 300.0000 mg | ORAL_TABLET | Freq: Two times a day (BID) | ORAL | Status: DC

## 2015-07-31 NOTE — Telephone Encounter (Signed)
Pharmacist called and Dr. Vicente Masson changed to cipro 500 mg BID 30 day supply

## 2015-07-31 NOTE — Telephone Encounter (Signed)
Patient informed. 

## 2015-07-31 NOTE — Telephone Encounter (Signed)
Routed by mistake to Dr. Manuella Ghazi , this note has been re routed to [employer]

## 2015-07-31 NOTE — Telephone Encounter (Signed)
Pharmacy does not have medication that you ordered,stated that it would be a couple of days for them to order it. Would you like to change the medication?

## 2015-07-31 NOTE — Telephone Encounter (Signed)
Pertaining to message below: patient would like for you to send [place]. States Dr Rutherford Nail has prescribed this medication to him before.

## 2015-08-01 NOTE — Progress Notes (Signed)
Date:  07/31/2015   Name:  Jeffery [name]   DOB:  [birth year]   MRN:  [id number]  PCP:  Loistine Chance, MD    Chief Complaint: Prostatitis   History of Present Illness:  This is a 45 y.o. male with hx prostatitis and BPH c/o 4d hx dysuria and LBP. Good response to Floxin in past.  Review of Systems:  Review of Systems  Constitutional: Negative for fever.  Genitourinary: Negative for hematuria and decreased urine volume.    Patient Active Problem List   Diagnosis Date Noted  . 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 Dyslipidemia 03/10/2015  . 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 Hypotestosteronism 03/10/2015  . Opioid type dependence, abuse ([place]) 03/10/2015  . Adiposity 03/10/2015  . Acute upper back pain 12/23/2014  . Annual physical exam 12/23/2014  . History of nasal septoplasty 11/23/2014  . Acute prostatitis 10/21/2014  . Chronic prostatitis 10/21/2014  . BPH (benign prostatic hypertrophy) 10/21/2014  . Obesity 10/21/2014  . Umbilical hernia [id number]    Prior to Admission medications   Medication Sig Start Date End Date Taking? Authorizing Provider  MULTIPLE VITAMIN IV Take by mouth.    Historical Provider, MD  ofloxacin (FLOXIN) 300 MG tablet Take 1 tablet (300 mg total) by mouth 2 (two) times daily. 07/31/15   Adline Potter, MD  SUMAtriptan (IMITREX) 100 MG tablet Take 1 tablet (100 mg total) by mouth every 2 (two) hours as needed for migraine. May repeat in 2 hours if headache persists or recurs. 03/10/15   Ashok Norris, MD  Testosterone (ANDROGEL PUMP) 20.25 MG/ACT (1.62%) GEL Apply 1 application topically daily. 01/23/15   Steele Sizer, MD    Allergies  Allergen Reactions  . Doxycycline Other (See Comments)    Past Surgical History  Procedure Laterality Date  . Wrist fracture surgery Right   . Prostate biopsy  2015  . Hernia repair  [id number]    umbilical hernia  . Nasal septoplasty w/ turbinoplasty Bilateral  11/23/2014    Procedure: NASAL SEPTOPLASTY WITH TURBINATE REDUCTION;  Surgeon: Carloyn Manner, MD;  Location: [place];  Service: ENT;  Laterality: Bilateral;    Social History  Substance Use Topics  . Smoking status: Former Smoker -- 0.50 packs/day for 20 years  . Smokeless tobacco: None     Comment: Uses E-Cigarettes  . Alcohol Use: No    Family History  Problem Relation Age of Onset  . Diabetes Mother   . Hypertension Father   . Cancer Maternal Grandmother     Lung-  Non-Smoker    Medication list has been reviewed and updated.  Physical Examination: BP 102/80 mmHg  Pulse 94  Temp(Src) 98.5 F (36.9 C)  Resp 18  Ht 5\' 11"  (1.803 m)  Wt 213 lb 3 oz (96.701 kg)  BMI 29.75 kg/m2  SpO2 96%  Physical Exam  Constitutional: He appears well-developed and well-nourished.  Genitourinary:  No CVAT  Neurological: He is alert.  Skin: Skin is warm and dry.  Psychiatric: He has a normal mood and affect. His behavior is normal.  Nursing note and vitals reviewed.   Assessment and Plan:  1. Chronic prostatitis UA unremarkable, begin Floxin x 30d, f/u with urology - POCT Urinalysis Dipstick - Urine Culture  Return if symptoms worsen or fail to improve.  Satira Anis. [facility]  08/01/2015

## 2015-08-10 DIAGNOSIS — R350 Frequency of micturition: Secondary | ICD-10-CM | POA: Diagnosis not present

## 2015-08-10 DIAGNOSIS — N5201 Erectile dysfunction due to arterial insufficiency: Secondary | ICD-10-CM | POA: Diagnosis not present

## 2015-08-10 DIAGNOSIS — N401 Enlarged prostate with lower urinary tract symptoms: Secondary | ICD-10-CM | POA: Diagnosis not present

## 2015-08-10 DIAGNOSIS — E291 Testicular hypofunction: Secondary | ICD-10-CM | POA: Diagnosis not present

## 2015-09-06 LAB — URINE CULTURE: ORGANISM ID, BACTERIA: NO GROWTH

## 2015-10-14 ENCOUNTER — Ambulatory Visit
Admission: EM | Admit: 2015-10-14 | Discharge: 2015-10-14 | Disposition: A | Discharge status: Home / Self Care | Payer: BLUE CROSS/BLUE SHIELD | Attending: Family Medicine | Admitting: Family Medicine

## 2015-10-14 DIAGNOSIS — M549 Dorsalgia, unspecified: Secondary | ICD-10-CM | POA: Diagnosis not present

## 2015-10-14 DIAGNOSIS — M62830 Muscle spasm of back: Secondary | ICD-10-CM

## 2015-10-14 MED ORDER — NAPROXEN 500 MG PO TABS: 500.0000 mg | ORAL_TABLET | Freq: Two times a day (BID) | ORAL | 0 refills | Status: DC

## 2015-10-14 MED ORDER — CYCLOBENZAPRINE HCL 5 MG PO TABS
ORAL_TABLET | ORAL | 0 refills | Status: DC
Start: 2015-10-14 — End: 2016-02-05

## 2015-10-14 NOTE — ED Provider Notes (Signed)
[facility]    CSN: [id number] Arrival date & time: 10/14/15  0805  First Provider Contact:  None       History   Chief Complaint Chief Complaint  Patient presents with  . Back Pain    HPI Jeffery [name] is a 45 y.o. male.   HPI: Patient presents today with symptoms of mid back pain. Patient states that he was helping his daughter move a washer and dryer a few days ago when his symptoms began. There was no fall or other trauma. His back started to spasm and hurt the next day. He denies any known history of any back problems. He denies any radicular symptoms. He has used some Biofreeze on the back area.  Past Medical History:  Diagnosis Date  . Dental crowns present    sides and back  . Dyslipidemia   . Enlarged prostate   . Epididymitis   . Headache    sinus  . Hypotestosteronism   . Nodular prostate with lower urinary tract symptoms   . Obesity   . Testosterone deficiency   . Umbilical hernia   . Umbilical hernia without obstruction or gangrene     Patient Active Problem List   Diagnosis Date Noted  . 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 Dyslipidemia 03/10/2015  . 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 Hypotestosteronism 03/10/2015  . Opioid type dependence, abuse ([place]) 03/10/2015  . Adiposity 03/10/2015  . Acute upper back pain 12/23/2014  . Annual physical exam 12/23/2014  . History of nasal septoplasty 11/23/2014  . Acute prostatitis 10/21/2014  . Chronic prostatitis 10/21/2014  . BPH (benign prostatic hypertrophy) 10/21/2014  . Obesity 10/21/2014  . Umbilical hernia 123XX123    Past Surgical History:  Procedure Laterality Date  . HERNIA REPAIR  [id number]   umbilical hernia  . NASAL SEPTOPLASTY W/ TURBINOPLASTY Bilateral 11/23/2014   Procedure: NASAL SEPTOPLASTY WITH TURBINATE REDUCTION;  Surgeon: Carloyn Manner, MD;  Location: [place];  Service: ENT;  Laterality: Bilateral;  . PROSTATE  BIOPSY  2015  . WRIST FRACTURE SURGERY Right        Home Medications    Prior to Admission medications   Medication Sig Start Date End Date Taking? Authorizing Provider  MULTIPLE VITAMIN IV Take by mouth.   Yes Historical Provider, MD  Testosterone (ANDROGEL PUMP) 20.25 MG/ACT (1.62%) GEL Apply 1 application topically daily. 01/23/15  Yes Steele Sizer, MD  ofloxacin (FLOXIN) 300 MG tablet Take 1 tablet (300 mg total) by mouth 2 (two) times daily. 07/31/15   Adline Potter, MD  SUMAtriptan (IMITREX) 100 MG tablet Take 1 tablet (100 mg total) by mouth every 2 (two) hours as needed for migraine. May repeat in 2 hours if headache persists or recurs. 03/10/15   Ashok Norris, MD    Family History Family History  Problem Relation Age of Onset  . Diabetes Mother   . Hypertension Father   . Cancer Maternal Grandmother     Lung-  Non-Smoker    Social History Social History  Substance Use Topics  . Smoking status: Current Every Day Smoker    Packs/day: 0.50    Years: 20.00    Types: E-cigarettes  . Smokeless tobacco: Never Used     Comment: Uses E-Cigarettes  . Alcohol use No     Allergies   Doxycycline   Review of Systems Review of Systems: Negative except mentioned above.   Physical Exam Triage Vital Signs ED Triage Vitals  Enc Vitals Group     BP 10/14/15 0836 102/74     Pulse Rate 10/14/15 0836 80     Resp 10/14/15 0836 16     Temp 10/14/15 0836 97.8 F (36.6 C)     Temp Source 10/14/15 0836 Tympanic     SpO2 10/14/15 0836 98 %     Weight 10/14/15 0834 205 lb (93 kg)     Height 10/14/15 0834 5\' 10"  (1.778 m)     Head Circumference --      Peak Flow --      Pain Score 10/14/15 0836 10     Pain Loc --      Pain Edu? --      Excl. in [place]? --    No data found.   Updated Vital Signs BP 102/74 (BP Location: Left Arm)   Pulse 80   Temp 97.8 F (36.6 C) (Tympanic)   Resp 16   Ht 5\' 10"  (1.778 m)   Wt 205 lb (93 kg)   SpO2 98%   BMI 29.41 kg/m       Physical Exam:  GENERAL: NAD RESP: CTA B CARD: RRR MSK: no midline tenderness, mild lat. dorsi tenderness bilaterally R>L, mild spasm of the area, ROM limited in all directions due to discomfort, -SLR NEURO: CN II-XII grossly intact    UC Treatments / Results  Labs (all labs ordered are listed, but only abnormal results are displayed) Labs Reviewed - No data to display  EKG  EKG Interpretation None       Radiology No results found.  Procedures Procedures (including critical care time)  Medications Ordered in UC Medications - No data to display   Initial Impression / Assessment and Plan / UC Course  I have reviewed the triage vital signs and the nursing notes.  Pertinent labs & imaging results that were available during my care of the patient were reviewed by me and considered in my medical decision making (see chart for details).  Clinical Course   A/P: Mid back pain with spasm - discussed with patient likely muscular, will treat with Naprosyn and Flexeril for now, ice/heat when necessary, range of motion exercises, if symptoms do not improve in the next few days I would recommend imaging of the area. Patient addresses understanding and will seek medical attention if symptoms do not improve.  Final Clinical Impressions(s) / UC Diagnoses   Final diagnoses:  None    New Prescriptions New Prescriptions   No medications on file     Paulina Fusi, MD 10/14/15 [id number]

## 2015-10-14 NOTE — ED Triage Notes (Signed)
Patient states that he was helping move his daughter on Wednesday. Patient states that when he woke up on Thursday he was in slight pain. Patient states that pain is in mid upper back and is worse with positional changes. Patient states that pain is sharp at times. Patient states that pain has been gradually worsening since onset.

## 2015-10-15 ENCOUNTER — Ambulatory Visit
Admission: EM | Admit: 2015-10-15 | Discharge: 2015-10-15 | Disposition: A | Discharge status: Home / Self Care | Payer: BLUE CROSS/BLUE SHIELD | Attending: Family Medicine | Admitting: Family Medicine

## 2015-10-15 ENCOUNTER — Ambulatory Visit (INDEPENDENT_AMBULATORY_CARE_PROVIDER_SITE_OTHER): Payer: BLUE CROSS/BLUE SHIELD

## 2015-10-15 ENCOUNTER — Encounter: Payer: Self-pay | Admitting: Emergency Medicine

## 2015-10-15 DIAGNOSIS — M62830 Muscle spasm of back: Secondary | ICD-10-CM

## 2015-10-15 DIAGNOSIS — M546 Pain in thoracic spine: Secondary | ICD-10-CM | POA: Diagnosis not present

## 2015-10-15 MED ORDER — KETOROLAC TROMETHAMINE 60 MG/2ML IM SOLN
60.0000 mg | Freq: Once | INTRAMUSCULAR | Status: AC
  Administered 2015-10-15: 60 mg via INTRAMUSCULAR

## 2015-10-15 NOTE — ED Triage Notes (Signed)
Patient states that he was seen here yesterday for back pain.  Patient states that his pain has gotten worse.

## 2015-10-15 NOTE — Discharge Instructions (Signed)
Recommend patient follow up with primary care physician this week. If symptoms are still persistent or worse would recommend further imaging with CT or MRI. Patient addresses understanding of this. Recommend increasing the Flexeril to 10 mg and continuing the Naprosyn as prescribed.

## 2015-10-15 NOTE — ED Provider Notes (Signed)
[facility]    CSN: [id number] Arrival date & time: 10/15/15  1112  First Provider Contact:  None       History   Chief Complaint Chief Complaint  Patient presents with  . Back Pain    HPI Jeffery [name] is a 45 y.o. male.   HPI: Patient presents for follow-up regarding mid back pain. Patient states that since yesterday when he presented symptoms have not improved much. He denies any radicular symptoms. Imaging was not done yesterday. He states that his pain started after he was moving a washer dryer a few days ago. He denies any chest pain or shortness of breath. He states that the back feels like it is in a spasm.  Past Medical History:  Diagnosis Date  . Dental crowns present    sides and back  . Dyslipidemia   . Enlarged prostate   . Epididymitis   . Headache    sinus  . Hypotestosteronism   . Nodular prostate with lower urinary tract symptoms   . Obesity   . Testosterone deficiency   . Umbilical hernia   . Umbilical hernia without obstruction or gangrene     Patient Active Problem List   Diagnosis Date Noted  . 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 Dyslipidemia 03/10/2015  . 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 Hypotestosteronism 03/10/2015  . Opioid type dependence, abuse ([place]) 03/10/2015  . Adiposity 03/10/2015  . Acute upper back pain 12/23/2014  . Annual physical exam 12/23/2014  . History of nasal septoplasty 11/23/2014  . Acute prostatitis 10/21/2014  . Chronic prostatitis 10/21/2014  . BPH (benign prostatic hypertrophy) 10/21/2014  . Obesity 10/21/2014  . Umbilical hernia [id number]    Past Surgical History:  Procedure Laterality Date  . HERNIA REPAIR  [id number]   umbilical hernia  . NASAL SEPTOPLASTY W/ TURBINOPLASTY Bilateral 11/23/2014   Procedure: NASAL SEPTOPLASTY WITH TURBINATE REDUCTION;  Surgeon: Carloyn Manner, MD;  Location: [place];  Service: ENT;  Laterality: Bilateral;  .  PROSTATE BIOPSY  2015  . WRIST FRACTURE SURGERY Right        Home Medications    Prior to Admission medications   Medication Sig Start Date End Date Taking? Authorizing Provider  cyclobenzaprine (FLEXERIL) 5 MG tablet Take one tablet Po bid prn muscle spasm. Can cause drowsiness. 10/14/15   Paulina Fusi, MD  MULTIPLE VITAMIN IV Take by mouth.    Historical Provider, MD  naproxen (NAPROSYN) 500 MG tablet Take 1 tablet (500 mg total) by mouth 2 (two) times daily. 10/14/15   Paulina Fusi, MD  ofloxacin (FLOXIN) 300 MG tablet Take 1 tablet (300 mg total) by mouth 2 (two) times daily. 07/31/15   Adline Potter, MD  SUMAtriptan (IMITREX) 100 MG tablet Take 1 tablet (100 mg total) by mouth every 2 (two) hours as needed for migraine. May repeat in 2 hours if headache persists or recurs. 03/10/15   Ashok Norris, MD  Testosterone (ANDROGEL PUMP) 20.25 MG/ACT (1.62%) GEL Apply 1 application topically daily. 01/23/15   Steele Sizer, MD    Family History Family History  Problem Relation Age of Onset  . Diabetes Mother   . Hypertension Father   . Cancer Maternal Grandmother     Lung-  Non-Smoker    Social History Social History  Substance Use Topics  . Smoking status: Current Every Day Smoker    Packs/day: 0.50    Years: 20.00    Types: E-cigarettes  . Smokeless  tobacco: Never Used     Comment: Uses E-Cigarettes  . Alcohol use No     Allergies   Doxycycline   Review of Systems Review of Systems: Negative except mentioned above.   Physical Exam Triage Vital Signs ED Triage Vitals  Enc Vitals Group     BP 10/15/15 1145 129/78     Pulse Rate 10/15/15 1145 88     Resp 10/15/15 1145 16     Temp 10/15/15 1145 98 F (36.7 C)     Temp Source 10/15/15 1145 Oral     SpO2 10/15/15 1145 100 %     Weight 10/15/15 1145 205 lb (93 kg)     Height 10/15/15 1145 5\' 10"  (1.778 m)     Head Circumference --      Peak Flow --      Pain Score 10/15/15 1146 10     Pain Loc --      Pain  Edu? --      Excl. in [place]? --    No data found.   Updated Vital Signs BP 129/78 (BP Location: Left Arm)   Pulse 88   Temp 98 F (36.7 C) (Oral)   Resp 16   Ht 5\' 10"  (1.778 m)   Wt 205 lb (93 kg)   SpO2 100%   BMI 29.41 kg/m       Physical Exam:  GENERAL: mild discomfort lying on back on exam table  RESP: CTA B CARD: RRR MSK: no midline tenderness, mild generalized spasm mid back, mildly decreased ROM due to discomfort, -SLR, 5/5 strength of LEs  NEURO: CN II-XII grossly intact    UC Treatments / Results  Labs (all labs ordered are listed, but only abnormal results are displayed) Labs Reviewed - No data to display  EKG  EKG Interpretation None       Radiology Dg Thoracic Spine 2 View  Result Date: 10/15/2015 CLINICAL DATA:  Upper back pain after heavy lifting 4 days ago EXAM: THORACIC SPINE 2 VIEWS COMPARISON:  01/22/2009 FINDINGS: Five views of thoracic spine submitted. No acute fracture or subluxation. Alignment, disc spaces and vertebral body heights are preserved. IMPRESSION: Negative. Electronically Signed   By: Lahoma Crocker M.D.   On: 10/15/2015 12:58    Procedures Procedures (including critical care time)  Medications Ordered in UC Medications  ketorolac (TORADOL) injection 60 mg (not administered)     Initial Impression / Assessment and Plan / UC Course  I have reviewed the triage vital signs and the nursing notes.  Pertinent labs & imaging results that were available during my care of the patient were reviewed by me and considered in my medical decision making (see chart for details).  Clinical Course   A/P:Mid back pain, muscle spasm- x-rays were done which were negative for any compression fracture, patient's symptoms improved with Toradol 60 mg given in office, I have recommended that he increase his Flexeril dose to 10 mg, ice/heat when necessary, follow up with primary care physician this week and would recommend further imaging such as CT or  MRI if symptoms do persist or worsen as discussed.   Final Clinical Impressions(s) / UC Diagnoses   Final diagnoses:  None    New Prescriptions New Prescriptions   No medications on file     Paulina Fusi, MD 10/15/15 1336

## 2015-11-09 DIAGNOSIS — N411 Chronic prostatitis: Secondary | ICD-10-CM | POA: Diagnosis not present

## 2015-11-09 DIAGNOSIS — R350 Frequency of micturition: Secondary | ICD-10-CM | POA: Diagnosis not present

## 2015-11-09 DIAGNOSIS — R3915 Urgency of urination: Secondary | ICD-10-CM | POA: Diagnosis not present

## 2015-11-09 DIAGNOSIS — R3914 Feeling of incomplete bladder emptying: Secondary | ICD-10-CM | POA: Diagnosis not present

## 2015-11-22 DIAGNOSIS — N401 Enlarged prostate with lower urinary tract symptoms: Secondary | ICD-10-CM | POA: Diagnosis not present

## 2015-11-22 DIAGNOSIS — Z79899 Other long term (current) drug therapy: Secondary | ICD-10-CM | POA: Diagnosis not present

## 2015-11-22 DIAGNOSIS — E291 Testicular hypofunction: Secondary | ICD-10-CM | POA: Diagnosis not present

## 2015-11-22 DIAGNOSIS — Z23 Encounter for immunization: Secondary | ICD-10-CM | POA: Diagnosis not present

## 2015-12-12 DIAGNOSIS — N401 Enlarged prostate with lower urinary tract symptoms: Secondary | ICD-10-CM | POA: Diagnosis not present

## 2015-12-13 DIAGNOSIS — N411 Chronic prostatitis: Secondary | ICD-10-CM | POA: Diagnosis not present

## 2015-12-13 DIAGNOSIS — R3914 Feeling of incomplete bladder emptying: Secondary | ICD-10-CM | POA: Diagnosis not present

## 2015-12-13 DIAGNOSIS — N401 Enlarged prostate with lower urinary tract symptoms: Secondary | ICD-10-CM | POA: Diagnosis not present

## 2015-12-13 DIAGNOSIS — R350 Frequency of micturition: Secondary | ICD-10-CM | POA: Diagnosis not present

## 2016-01-01 DIAGNOSIS — N401 Enlarged prostate with lower urinary tract symptoms: Secondary | ICD-10-CM | POA: Diagnosis not present

## 2016-01-01 DIAGNOSIS — R350 Frequency of micturition: Secondary | ICD-10-CM | POA: Diagnosis not present

## 2016-01-01 DIAGNOSIS — R3914 Feeling of incomplete bladder emptying: Secondary | ICD-10-CM | POA: Diagnosis not present

## 2016-01-01 DIAGNOSIS — N411 Chronic prostatitis: Secondary | ICD-10-CM | POA: Diagnosis not present

## 2016-02-02 DIAGNOSIS — G4733 Obstructive sleep apnea (adult) (pediatric): Secondary | ICD-10-CM | POA: Diagnosis not present

## 2016-02-05 ENCOUNTER — Ambulatory Visit (INDEPENDENT_AMBULATORY_CARE_PROVIDER_SITE_OTHER): Payer: BLUE CROSS/BLUE SHIELD

## 2016-02-05 ENCOUNTER — Ambulatory Visit
Admission: EM | Admit: 2016-02-05 | Discharge: 2016-02-05 | Disposition: A | Discharge status: Home / Self Care | Payer: BLUE CROSS/BLUE SHIELD | Attending: Family Medicine | Admitting: Family Medicine

## 2016-02-05 DIAGNOSIS — J40 Bronchitis, not specified as acute or chronic: Secondary | ICD-10-CM

## 2016-02-05 DIAGNOSIS — R05 Cough: Secondary | ICD-10-CM | POA: Diagnosis not present

## 2016-02-05 DIAGNOSIS — J019 Acute sinusitis, unspecified: Secondary | ICD-10-CM

## 2016-02-05 MED ORDER — BENZONATATE 100 MG PO CAPS: 100.0000 mg | ORAL_CAPSULE | Freq: Three times a day (TID) | ORAL | 0 refills | Status: DC | PRN

## 2016-02-05 MED ORDER — ALBUTEROL SULFATE HFA 108 (90 BASE) MCG/ACT IN AERS: 2.0000 | INHALATION_SPRAY | RESPIRATORY_TRACT | 0 refills | Status: DC | PRN

## 2016-02-05 MED ORDER — HYDROCOD POLST-CPM POLST ER 10-8 MG/5ML PO SUER: 5.0000 mL | Freq: Every evening | ORAL | 0 refills | Status: DC | PRN

## 2016-02-05 MED ORDER — AZITHROMYCIN 250 MG PO TABS: ORAL_TABLET | ORAL | 0 refills | Status: DC

## 2016-02-05 MED ORDER — IPRATROPIUM-ALBUTEROL 0.5-2.5 (3) MG/3ML IN SOLN
3.0000 mL | Freq: Four times a day (QID) | RESPIRATORY_TRACT | Status: DC
  Administered 2016-02-05: 3 mL via RESPIRATORY_TRACT

## 2016-02-05 MED ORDER — PREDNISONE 10 MG PO TABS: ORAL_TABLET | ORAL | 0 refills | Status: DC

## 2016-02-05 NOTE — Discharge Instructions (Signed)
Take medication as prescribed. Rest. Drink plenty of fluids.  ° °Follow up with your primary care physician this week as needed. Return to Urgent care for new or worsening concerns.  ° °

## 2016-02-05 NOTE — ED Provider Notes (Signed)
[facility] ____________________________________________  Time seen: Approximately 10:49 AM  I have reviewed the triage vital signs and the nursing notes.   HISTORY  Chief Complaint Nasal Congestion  HPI Jeffery [name] is a 45 y.o. male presents for the complaint of one week of runny nose, nasal congestion, cough and chest congestion. Patient reports nasal congestion may have been present prior to one week ago. Patient reports initially he felt like he was just getting a cold however symptoms have progressed. Patient reports his son recently sick with similar. Patient reports that he felt like he now has more chest congestion and occasional wheezing and productive cough. States thick greenish yellowish sputum from cough as well as with nasal congestion. Denies hemoptysis. Patient reports that he has had some intermittent fevers, states that he thinks he had a 101 orally fever yesterday. Reports taking over-the-counter cough and congestion medications intermittently without resolution. States occasional wheeze with cough, denies other wheezing. States that he coughs so much that now he feels sore on his sides. Denies chest pain or shortness of breath. States no medications taken today.  Denies chest pain or shortness of breath, chest pain with deep breath, abdominal pain, dysuria, extremity pain, extremity swelling, recent sickness or recent antibiotic use. Denies any recent medication changes.  Loistine Chance, MD: PCP   Past Medical History:  Diagnosis Date  . Dental crowns present    sides and back  . Dyslipidemia   . Enlarged prostate   . Epididymitis   . Headache    sinus  . Hypotestosteronism   . Nodular prostate with lower urinary tract symptoms   . Obesity   . Testosterone deficiency   . Umbilical hernia   . Umbilical hernia without obstruction or gangrene     Patient Active Problem List   Diagnosis Date Noted  . Benign prostatic hyperplasia with urinary  obstruction 03/10/2015  . Dyslipidemia 03/10/2015  . 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 Hypotestosteronism 03/10/2015  . Opioid type dependence, abuse ([place]) 03/10/2015  . Adiposity 03/10/2015  . Acute upper back pain 12/23/2014  . Annual physical exam 12/23/2014  . History of nasal septoplasty 11/23/2014  . Acute prostatitis 10/21/2014  . Chronic prostatitis 10/21/2014  . BPH (benign prostatic hypertrophy) 10/21/2014  . Obesity 10/21/2014  . Umbilical hernia 123XX123    Past Surgical History:  Procedure Laterality Date  . HERNIA REPAIR  [id number]   umbilical hernia  . NASAL SEPTOPLASTY W/ TURBINOPLASTY Bilateral 11/23/2014   Procedure: NASAL SEPTOPLASTY WITH TURBINATE REDUCTION;  Surgeon: Carloyn Manner, MD;  Location: [facility];  Service: ENT;  Laterality: Bilateral;  . PROSTATE BIOPSY  2015  . WRIST FRACTURE SURGERY Right      Current Facility-Administered Medications:  .  ipratropium-albuterol (DUONEB) 0.5-2.5 (3) MG/3ML nebulizer solution 3 mL, 3 mL, Nebulization, Q6H, Marylene Land, NP, 3 mL at 02/05/16 1059  Current Outpatient Prescriptions:  .  SUMAtriptan (IMITREX) 100 MG tablet, Take 1 tablet (100 mg total) by mouth every 2 (two) hours as needed for migraine. May repeat in 2 hours if headache persists or recurs., Disp: 10 tablet, Rfl: 0 .  tadalafil (CIALIS) 5 MG tablet, Take 5 mg by mouth daily as needed for erectile dysfunction., Disp: , Rfl:  .  Testosterone (ANDROGEL PUMP) 20.25 MG/ACT (1.62%) GEL, Apply 1 application topically daily., Disp: 75 g, Rfl: 0 .  albuterol (PROVENTIL HFA;VENTOLIN HFA) 108 (90 Base) MCG/ACT inhaler, Inhale 2 puffs into the lungs every 4 (four) hours as  needed., Disp: 1 Inhaler, Rfl: 0 .  azithromycin (ZITHROMAX Z-PAK) 250 MG tablet, Take 2 tablets (500 mg) on  Day 1,  followed by 1 tablet (250 mg) once daily on Days 2 through 5., Disp: 6 each, Rfl: 0 .  benzonatate (TESSALON PERLES) 100 MG capsule, Take 1 capsule (100 mg  total) by mouth 3 (three) times daily as needed., Disp: 15 capsule, Rfl: 0 .  chlorpheniramine-HYDROcodone (TUSSIONEX PENNKINETIC ER) 10-8 MG/5ML SUER, Take 5 mLs by mouth at bedtime as needed. do not drive or operate machinery while taking as can cause drowsiness., Disp: 75 mL, Rfl: 0 .  naproxen (NAPROSYN) 500 MG tablet, Take 1 tablet (500 mg total) by mouth 2 (two) times daily., Disp: 20 tablet, Rfl: 0 .  predniSONE (DELTASONE) 10 MG tablet, Start 60 mg po day one, then 50 mg po day two, taper by 10 mg daily until complete., Disp: 21 tablet, Rfl: 0  Allergies Doxycycline  Family History  Problem Relation Age of Onset  . Diabetes Mother   . Hypertension Father   . Cancer Maternal Grandmother     Lung-  Non-Smoker    Social History Social History  Substance Use Topics  . Smoking status: Current Every Day Smoker    Packs/day: 0.50    Years: 20.00    Types: E-cigarettes  . Smokeless tobacco: Never Used     Comment: Uses E-Cigarettes  . Alcohol use No    Review of Systems Constitutional: As above.  Eyes: No visual changes. ENT: As above.  Cardiovascular: Denies chest pain. Respiratory: Denies shortness of breath. Gastrointestinal: No abdominal pain.  No nausea, no vomiting.  No diarrhea.  No constipation. Genitourinary: Negative for dysuria. Musculoskeletal: Negative for back pain. Skin: Negative for rash. Neurological: Negative for headaches, focal weakness or numbness.  10-point ROS otherwise negative.  ____________________________________________   PHYSICAL EXAM:  VITAL SIGNS: ED Triage Vitals  Enc Vitals Group     BP 02/05/16 1008 105/76     Pulse Rate 02/05/16 1008 96     Resp 02/05/16 1008 18     Temp 02/05/16 1008 98.8 F (37.1 C)     Temp Source 02/05/16 1008 Oral     SpO2 02/05/16 1008 98 %     Weight 02/05/16 1010 215 lb (97.5 kg)     Height 02/05/16 1010 5\' 10"  (1.778 m)     Head Circumference --      Peak Flow --      Pain Score 02/05/16 1011 10       Pain Loc --      Pain Edu? --      Excl. in [place]? --    Constitutional: Alert and oriented. Well appearing and in no acute distress. Eyes: Conjunctivae are normal. PERRL. EOMI. Head: Atraumatic. No sinus tenderness to palpation. No swelling. No erythema.  Ears: no erythema, normal TMs bilaterally.   Nose:Nasal congestion with clear rhinorrhea  Mouth/Throat: Mucous membranes are moist. No pharyngeal erythema. No tonsillar swelling or exudate.  Neck: No stridor.  No cervical spine tenderness to palpation. Hematological/Lymphatic/Immunilogical: No cervical lymphadenopathy. Cardiovascular: Normal rate, regular rhythm. Grossly normal heart sounds.  Good peripheral circulation. Respiratory: Normal respiratory effort.  No retractions.scattered rhonchi. Mild scattered inspiratory wheezes. Speaks in complete sentences. No focal area of consolidation auscultated. Good air movement.  Gastrointestinal: Soft and nontenderNo CVA tenderness. Musculoskeletal: Ambulatory with steady gait. No cervical, thoracic or lumbar tenderness to palpation. Neurologic:  Normal speech and language. No gross focal neurologic deficits  are appreciated. No gait instability. Skin:  Skin is warm, dry and intact. No rash noted. Psychiatric: Mood and affect are normal. Speech and behavior are normal.  ___________________________________________   LABS (all labs ordered are listed, but only abnormal results are displayed)  Labs Reviewed - No data to display  RADIOLOGY  Dg Chest 2 View  Result Date: 02/05/2016 CLINICAL DATA:  Productive cough and chest congestion and chest pain. Fever. EXAM: CHEST  2 VIEW COMPARISON:  Radiograph dated 10/15/2015 FINDINGS: The heart size and mediastinal contours are within normal limits. Both lungs are clear. The visualized skeletal structures are unremarkable. IMPRESSION: No active cardiopulmonary disease. Electronically Signed   By: Lorriane Shire M.D.   On: 02/05/2016 11:22    ____________________________________________   PROCEDURES Procedures    INITIAL IMPRESSION / ASSESSMENT AND PLAN / ED COURSE  Pertinent labs & imaging results that were available during my care of the patient were reviewed by me and considered in my medical decision making (see chart for details).  Well-appearing patient. No acute distress. Cough and congestion symptoms gradually over one week, subjective report of fever yesterday. Will evaluate chest x-ray to exclude pneumonia. DuoNeb given once in urgent care.  Chest x-ray negative for acute cardiopulmonary disease per radiologist. Will treat patient sinusitis and bronchitis. Lungs reexamined, Wheezes resolved after DuoNeb. Will treat patient with oral azithromycin, prednisone taper, when necessary albuterol inhaler, when necessary Present When Necessary Tussionex. a controlled substance database reviewed, with AndroGel being the only recently received a controlled substance of the last 6 months.Discussed indication, risks and benefits of medications with patient. Encouraged supportive care, rest, fluids and PCP follow up.  Discussed follow up with Primary care physician this week. Discussed follow up and return parameters including no resolution or any worsening concerns. Patient verbalized understanding and agreed to plan.   ____________________________________________   FINAL CLINICAL IMPRESSION(S) / ED DIAGNOSES  Final diagnoses:  Bronchitis  Acute sinusitis, recurrence not specified, unspecified location     New Prescriptions   ALBUTEROL (PROVENTIL HFA;VENTOLIN HFA) 108 (90 BASE) MCG/ACT INHALER    Inhale 2 puffs into the lungs every 4 (four) hours as needed.   AZITHROMYCIN (ZITHROMAX Z-PAK) 250 MG TABLET    Take 2 tablets (500 mg) on  Day 1,  followed by 1 tablet (250 mg) once daily on Days 2 through 5.   BENZONATATE (TESSALON PERLES) 100 MG CAPSULE    Take 1 capsule (100 mg total) by mouth 3 (three) times daily as needed.    CHLORPHENIRAMINE-HYDROCODONE (TUSSIONEX PENNKINETIC ER) 10-8 MG/5ML SUER    Take 5 mLs by mouth at bedtime as needed. do not drive or operate machinery while taking as can cause drowsiness.   PREDNISONE (DELTASONE) 10 MG TABLET    Start 60 mg po day one, then 50 mg po day two, taper by 10 mg daily until complete.    Note: This dictation was prepared with Dragon dictation along with smaller phrase technology. Any transcriptional errors that result from this process are unintentional.    Clinical Course       Marylene Land, NP 02/05/16 1141

## 2016-02-05 NOTE — ED Triage Notes (Signed)
Pt c/o coughing and congestion for over a week. He has had fever, body aches and chills. Cough is keeping him up at night.

## 2016-02-10 DIAGNOSIS — R05 Cough: Secondary | ICD-10-CM | POA: Diagnosis not present

## 2016-02-10 DIAGNOSIS — F17210 Nicotine dependence, cigarettes, uncomplicated: Secondary | ICD-10-CM | POA: Diagnosis not present

## 2016-02-10 DIAGNOSIS — R0981 Nasal congestion: Secondary | ICD-10-CM | POA: Diagnosis not present

## 2016-02-10 DIAGNOSIS — R093 Abnormal sputum: Secondary | ICD-10-CM | POA: Diagnosis not present

## 2016-02-12 ENCOUNTER — Other Ambulatory Visit: Payer: Self-pay | Admitting: Family Medicine

## 2016-02-12 ENCOUNTER — Telehealth: Payer: Self-pay

## 2016-02-12 NOTE — Telephone Encounter (Signed)
Patient requesting refill of generic Viagra 20 mg takes 1 during the day and 1 at night. States Dr. Rutherford Nail gave this prescription to the patient during his last visit with him. [phone].

## 2016-02-12 NOTE — Telephone Encounter (Signed)
I am sorry. Not seen by me. Last visit was Dr. Vicente Masson.  Viagra is not even on his list.  He needs to be assigned to Dr. Manuella Ghazi, since his last name starts with an M

## 2016-02-13 ENCOUNTER — Encounter: Payer: Self-pay | Admitting: Family Medicine

## 2016-02-13 ENCOUNTER — Ambulatory Visit (INDEPENDENT_AMBULATORY_CARE_PROVIDER_SITE_OTHER): Payer: BLUE CROSS/BLUE SHIELD | Admitting: Family Medicine

## 2016-02-13 VITALS — BP 110/78 | HR 96 | Temp 98.5°F | Resp 16 | Ht 70.0 in | Wt 207.1 lb

## 2016-02-13 DIAGNOSIS — J40 Bronchitis, not specified as acute or chronic: Secondary | ICD-10-CM

## 2016-02-13 DIAGNOSIS — Z720 Tobacco use: Secondary | ICD-10-CM

## 2016-02-13 DIAGNOSIS — R0981 Nasal congestion: Secondary | ICD-10-CM | POA: Diagnosis not present

## 2016-02-13 MED ORDER — FLUTICASONE FUROATE-VILANTEROL 100-25 MCG/INH IN AEPB: 1.0000 | INHALATION_SPRAY | Freq: Every day | RESPIRATORY_TRACT | 0 refills | Status: DC

## 2016-02-13 MED ORDER — FLUTICASONE PROPIONATE 50 MCG/ACT NA SUSP: 2.0000 | Freq: Every day | NASAL | 0 refills | Status: DC

## 2016-02-13 NOTE — Progress Notes (Signed)
Name: Jeffery [name]   MRN: [id number]    DOB: [birth year]   Date:02/13/2016       Progress Note  Subjective  Chief Complaint  Chief Complaint  Patient presents with  . Cough    pt was seen at urgent care last week and was given a zpak and pt stated that since he has finished the medication that he has began to feel worse  . Nasal Congestion    HPI  Bronchitis: he went to Urgent Care 02/05/2016 with ongoing symptoms of rhinorrhea, nasal congestion and cough. He also had a fever. CXR was negative. He was treated with a Z-pack, prednisone , Tussionex and Tessalon perles. He is frustrated because he is still coughing and has nasal congestion. Appetite is not back to normal and he is feeling tired. He denies nausea, but has some gagging in am's when his cough is worse.   Tobacco: he still smokes one pack daily, and has a history of daily cough, but usually just in am's, likely COPD. Explained importance of quitting smoking.   Patient Active Problem List   Diagnosis Date Noted  . 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 Dyslipidemia 03/10/2015  . 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 Hypotestosteronism 03/10/2015  . Opioid type dependence, abuse ([place]) 03/10/2015  . Adiposity 03/10/2015  . Acute upper back pain 12/23/2014  . History of nasal septoplasty 11/23/2014  . Chronic prostatitis 10/21/2014  . BPH (benign prostatic hypertrophy) 10/21/2014  . Obesity 10/21/2014  . Umbilical hernia [id number]    Past Surgical History:  Procedure Laterality Date  . HERNIA REPAIR  [id number]   umbilical hernia  . NASAL SEPTOPLASTY W/ TURBINOPLASTY Bilateral 11/23/2014   Procedure: NASAL SEPTOPLASTY WITH TURBINATE REDUCTION;  Surgeon: Carloyn Manner, MD;  Location: [facility];  Service: ENT;  Laterality: Bilateral;  . PROSTATE BIOPSY  2015  . WRIST FRACTURE SURGERY Right     Family History  Problem Relation Age of Onset  . Diabetes Mother   .  Hypertension Father   . Cancer Maternal Grandmother     Lung-  Non-Smoker    Social History   Social History  . Marital status: Single    Spouse name: N/A  . Number of children: N/A  . Years of education: N/A   Occupational History  . Not on file.   Social History Main Topics  . Smoking status: Current Every Day Smoker    Packs/day: 0.50    Years: 20.00    Types: E-cigarettes  . Smokeless tobacco: Never Used     Comment: Uses E-Cigarettes  . Alcohol use No  . Drug use: No  . Sexual activity: Yes    Partners: Female   Other Topics Concern  . Not on file   Social History Narrative  . No narrative on file     Current Outpatient Prescriptions:  .  albuterol (PROVENTIL HFA;VENTOLIN HFA) 108 (90 Base) MCG/ACT inhaler, Inhale 2 puffs into the lungs every 4 (four) hours as needed., Disp: 1 Inhaler, Rfl: 0 .  naproxen (NAPROSYN) 500 MG tablet, Take 1 tablet (500 mg total) by mouth 2 (two) times daily., Disp: 20 tablet, Rfl: 0 .  SUMAtriptan (IMITREX) 100 MG tablet, Take 1 tablet (100 mg total) by mouth every 2 (two) hours as needed for migraine. May repeat in 2 hours if headache persists or recurs., Disp: 10 tablet, Rfl: 0 .  tadalafil (CIALIS) 5 MG tablet, Take 5 mg by mouth daily as needed  for erectile dysfunction., Disp: , Rfl:  .  Testosterone (ANDROGEL PUMP) 20.25 MG/ACT (1.62%) GEL, Apply 1 application topically daily., Disp: 75 g, Rfl: 0  Allergies  Allergen Reactions  . Doxycycline Other (See Comments)     ROS  Ten systems reviewed and is negative except as mentioned in HPI   Objective  Vitals:   02/13/16 1550  BP: 110/78  Pulse: 96  Resp: 16  Temp: 98.5 F (36.9 C)  SpO2: 96%  Weight: 207 lb 2 oz (94 kg)  Height: 5\' 10"  (1.778 m)    Body mass index is 29.72 kg/m.  Physical Exam  Constitutional: Patient appears well-developed and well-nourished. No distress.  HEENT: head atraumatic, normocephalic, pupils equal and reactive to light, ears normal  TM bilaterally, clear rhinorrhea, neck supple, throat within normal limits Cardiovascular: Normal rate, regular rhythm and normal heart sounds.  No murmur heard. No BLE edema. Pulmonary/Chest: Effort normal and bilateral rhonchi. No respiratory distress. Abdominal: Soft.  There is no tenderness. Psychiatric: Patient has a normal mood and affect. behavior is normal. Judgment and thought content normal.  PHQ2/9: Depression screen PHQ 2/9 01/16/2015  Decreased Interest 0  Down, Depressed, Hopeless 0  PHQ - 2 Score 0    Fall Risk: Fall Risk  01/16/2015 10/20/2014  Falls in the past year? No No     Assessment & Plan  1. Bronchitis  - fluticasone furoate-vilanterol (BREO ELLIPTA) 100-25 MCG/INH AEPB; Inhale 1 puff into the lungs daily.  Dispense: 60 each; Refill: 0  We will try Breo, he has Ventolin at home. He took a zpack and a round on prednisone that helped symptoms temporarily.   2. Tobacco use  Advised importance of quitting smoking   3. Nasal congestion  - fluticasone (FLONASE) 50 MCG/ACT nasal spray; Place 2 sprays into both nostrils daily.  Dispense: 16 g; Refill: 0

## 2016-02-25 DIAGNOSIS — L02411 Cutaneous abscess of right axilla: Secondary | ICD-10-CM | POA: Diagnosis not present

## 2016-02-25 DIAGNOSIS — F17210 Nicotine dependence, cigarettes, uncomplicated: Secondary | ICD-10-CM | POA: Diagnosis not present

## 2016-02-25 DIAGNOSIS — M79621 Pain in right upper arm: Secondary | ICD-10-CM | POA: Diagnosis not present

## 2016-02-25 DIAGNOSIS — R2231 Localized swelling, mass and lump, right upper limb: Secondary | ICD-10-CM | POA: Diagnosis not present

## 2016-02-27 DIAGNOSIS — L309 Dermatitis, unspecified: Secondary | ICD-10-CM | POA: Diagnosis not present

## 2016-02-27 DIAGNOSIS — L02411 Cutaneous abscess of right axilla: Secondary | ICD-10-CM | POA: Diagnosis not present

## 2016-03-15 ENCOUNTER — Other Ambulatory Visit: Payer: Self-pay

## 2016-03-15 MED ORDER — TADALAFIL 5 MG PO TABS: 5.0000 mg | ORAL_TABLET | Freq: Every day | ORAL | 0 refills | Status: DC | PRN

## 2016-03-18 ENCOUNTER — Ambulatory Visit (INDEPENDENT_AMBULATORY_CARE_PROVIDER_SITE_OTHER): Payer: BLUE CROSS/BLUE SHIELD | Admitting: Family Medicine

## 2016-03-18 ENCOUNTER — Telehealth: Payer: Self-pay

## 2016-03-18 VITALS — BP 108/74 | HR 97 | Temp 98.7°F | Resp 14 | Wt 213.4 lb

## 2016-03-18 DIAGNOSIS — L989 Disorder of the skin and subcutaneous tissue, unspecified: Secondary | ICD-10-CM | POA: Diagnosis not present

## 2016-03-18 DIAGNOSIS — R21 Rash and other nonspecific skin eruption: Secondary | ICD-10-CM | POA: Diagnosis not present

## 2016-03-18 DIAGNOSIS — N3945 Continuous leakage: Secondary | ICD-10-CM | POA: Diagnosis not present

## 2016-03-18 DIAGNOSIS — R32 Unspecified urinary incontinence: Secondary | ICD-10-CM | POA: Insufficient documentation

## 2016-03-18 MED ORDER — TRIAMCINOLONE ACETONIDE 0.5 % EX OINT: 1.0000 "application " | TOPICAL_OINTMENT | Freq: Two times a day (BID) | CUTANEOUS | 1 refills | Status: DC

## 2016-03-18 MED ORDER — FLUCONAZOLE 150 MG PO TABS: 150.0000 mg | ORAL_TABLET | Freq: Every day | ORAL | 0 refills | Status: DC

## 2016-03-18 NOTE — Telephone Encounter (Signed)
Erroneous entry

## 2016-03-18 NOTE — Progress Notes (Signed)
BP 108/74   Pulse 97   Temp 98.7 F (37.1 C)   Resp 14   Wt 213 lb 7 oz (96.8 kg)   SpO2 96%   BMI 30.63 kg/m    Subjective:    Patient ID: Jeffery [name], male    DOB: April 23, 1970, 46 y.o.   MRN: [id number]  HPI: Jeffery [name] is a 46 y.o. male  Chief Complaint  Patient presents with  . Rash    Rash around private area   Rash is down in the private region; started a week ago Not itching; redness around the opening of penis and around the scrotum No tenderness No burning with urination No fevers Thought it was yeast and tried OTC medicine but did not help Tried jock itch spray didn't help Nothing similar in the past, but did have yeast infection 20 years ago from antibiotics Just finished 10 days of suprax for possible prostate infection Had a boil on the arm and took ten days of septra before that, so he has had two rounds of antibiotics in the last month Called urologist, but can't see him for a few days Has BPH and had microwave which helped for a year, but now having issues again; has a little urine leakage  Eczema on the arm, uses cream but not helping; intensely itchy and he scratches this; has a lower potency steroid cream that isn't really helping; his son has eczema too  Depression screen [facility] 2/9 03/18/2016 01/16/2015  Decreased Interest 0 0  Down, Depressed, Hopeless 0 0  PHQ - 2 Score 0 0   Relevant past medical, surgical, family and social history reviewed Past Medical History:  Diagnosis Date  . Dental crowns present    sides and back  . Dyslipidemia   . Enlarged prostate   . Epididymitis   . Headache    sinus  . Hypotestosteronism   . Nodular prostate with lower urinary tract symptoms   . Obesity   . Testosterone deficiency   . Umbilical hernia   . Umbilical hernia without obstruction or gangrene    Past Surgical History:  Procedure Laterality Date  . HERNIA REPAIR  [id number]   umbilical hernia  . NASAL SEPTOPLASTY W/ TURBINOPLASTY  Bilateral 11/23/2014   Procedure: NASAL SEPTOPLASTY WITH TURBINATE REDUCTION;  Surgeon: Carloyn Manner, MD;  Location: [facility];  Service: ENT;  Laterality: Bilateral;  . PROSTATE BIOPSY  2015  . WRIST FRACTURE SURGERY Right    Social History  Substance Use Topics  . Smoking status: Current Every Day Smoker    Packs/day: 0.50    Years: 20.00    Types: E-cigarettes  . Smokeless tobacco: Never Used     Comment: Uses E-Cigarettes  . Alcohol use No   Interim medical history since last visit reviewed. Allergies and medications reviewed  Review of Systems Per HPI unless specifically indicated above     Objective:    BP 108/74   Pulse 97   Temp 98.7 F (37.1 C)   Resp 14   Wt 213 lb 7 oz (96.8 kg)   SpO2 96%   BMI 30.63 kg/m   Wt Readings from Last 3 Encounters:  03/18/16 213 lb 7 oz (96.8 kg)  02/13/16 207 lb 2 oz (94 kg)  02/05/16 215 lb (97.5 kg)    Physical Exam  Constitutional: He appears well-developed and well-nourished. No distress.  Eyes: No scleral icterus.  Cardiovascular: Normal rate.   Pulmonary/Chest: Effort normal.  Genitourinary:  Genitourinary Comments: Uncircumcised male, normal phallus; spot of urine at urethral meatus; no purulence; mild erythema at tip of penis; mild erythema of anterior scrotal tissue; no satellites, no raised borders, no scale; nontender  Neurological: He is alert.  Skin: Rash (lateral left arm with several excoriations indicative of scratching with fingernails) noted.  Psychiatric: He has a normal mood and affect. His mood appears not anxious. He does not exhibit a depressed mood.   Results for orders placed or performed in visit on 07/31/15  Urine Culture  Result Value Ref Range   Urine Culture, Routine Final report    Urine Culture result 1 No growth   POCT Urinalysis Dipstick  Result Value Ref Range   Color, UA amber    Clarity, UA clear    Glucose, UA neg    Bilirubin, UA neg    Ketones, UA trace    Spec  Grav, UA 1.025    Blood, UA neg    pH, UA 8.0    Protein, UA neg    Urobilinogen, UA negative    Nitrite, UA neg    Leukocytes, UA Negative Negative      Assessment & Plan:   Problem List Items Addressed This Visit      Other   Urinary incontinence    Follow-up with urologist; he might try cutting panty liners into thirds to use if needed       Other Visit Diagnoses    Rash on scrotum    -  Primary   with recent antibiotic use, two rounds; patient wishes to try diflucan single pill; he'll see urologist in 2 days if not resolved   Eczematous skin lesions       will try higher potency corticosteroid cream; explained too strong for face, groin, underarms, children      Follow up plan: No Follow-up on file.  An after-visit summary was printed and given to the patient at [facility].  Please see the patient instructions which may contain other information and recommendations beyond what is mentioned above in the assessment and plan.  Meds ordered this encounter  Medications  . ibuprofen (ADVIL,MOTRIN) 800 MG tablet    Refill:  0  . fluconazole (DIFLUCAN) 150 MG tablet    Sig: Take 1 tablet (150 mg total) by mouth daily.    Dispense:  1 tablet    Refill:  0  . triamcinolone ointment (KENALOG) 0.5 %    Sig: Apply 1 application topically 2 (two) times daily. If needed; too strong for face, groin, underarms    Dispense:  30 g    Refill:  1    No orders of the defined types were placed in this encounter.

## 2016-03-18 NOTE — Patient Instructions (Addendum)
Please do eat yogurt daily or take a probiotic daily for the next month or two We want to replace the healthy germs in the gut If you notice foul, watery diarrhea in the next two months, schedule an appointment RIGHT AWAY Start the pill today Keep appointment with urologist if not improved Use the cream for the rash on the arm

## 2016-03-18 NOTE — Assessment & Plan Note (Signed)
Follow-up with urologist; he might try cutting panty liners into thirds to use if needed

## 2016-03-25 DIAGNOSIS — N481 Balanitis: Secondary | ICD-10-CM | POA: Diagnosis not present

## 2016-03-25 DIAGNOSIS — N5201 Erectile dysfunction due to arterial insufficiency: Secondary | ICD-10-CM | POA: Diagnosis not present

## 2016-04-02 ENCOUNTER — Encounter: Payer: BLUE CROSS/BLUE SHIELD | Admitting: Family Medicine

## 2016-04-04 DIAGNOSIS — R202 Paresthesia of skin: Secondary | ICD-10-CM | POA: Diagnosis not present

## 2016-04-04 DIAGNOSIS — R21 Rash and other nonspecific skin eruption: Secondary | ICD-10-CM | POA: Diagnosis not present

## 2016-04-04 DIAGNOSIS — F17210 Nicotine dependence, cigarettes, uncomplicated: Secondary | ICD-10-CM | POA: Diagnosis not present

## 2016-04-04 DIAGNOSIS — L539 Erythematous condition, unspecified: Secondary | ICD-10-CM | POA: Diagnosis not present

## 2016-04-05 ENCOUNTER — Encounter: Payer: Self-pay | Admitting: Family Medicine

## 2016-04-05 ENCOUNTER — Ambulatory Visit (INDEPENDENT_AMBULATORY_CARE_PROVIDER_SITE_OTHER): Payer: BLUE CROSS/BLUE SHIELD | Admitting: Family Medicine

## 2016-04-05 VITALS — BP 112/80 | HR 93 | Temp 98.0°F | Resp 18 | Ht 70.0 in | Wt 207.0 lb

## 2016-04-05 DIAGNOSIS — Z800 Family history of malignant neoplasm of digestive organs: Secondary | ICD-10-CM

## 2016-04-05 DIAGNOSIS — Z0000 Encounter for general adult medical examination without abnormal findings: Secondary | ICD-10-CM

## 2016-04-05 DIAGNOSIS — Z833 Family history of diabetes mellitus: Secondary | ICD-10-CM | POA: Diagnosis not present

## 2016-04-05 LAB — POCT GLYCOSYLATED HEMOGLOBIN (HGB A1C): Hemoglobin A1C: 5.5

## 2016-04-05 NOTE — Progress Notes (Signed)
Name: Jeffery [name]   MRN: [id number]    DOB: [birth year]   Date:04/05/2016       Progress Note  Subjective  Chief Complaint  Chief Complaint  Patient presents with  . Annual Exam    HPI  Pt. Presents for Complete Physical Exam.  He is doing well, sees Urologist for BPH. Mother had colon cancer, diagnosed when she was in her late 73s.    Past Medical History:  Diagnosis Date  . Dental crowns present    sides and back  . Dyslipidemia   . Enlarged prostate   . Epididymitis   . Headache    sinus  . Hypotestosteronism   . Nodular prostate with lower urinary tract symptoms   . Obesity   . Testosterone deficiency   . Umbilical hernia   . Umbilical hernia without obstruction or gangrene     Past Surgical History:  Procedure Laterality Date  . HERNIA REPAIR  [id number]   umbilical hernia  . NASAL SEPTOPLASTY W/ TURBINOPLASTY Bilateral 11/23/2014   Procedure: NASAL SEPTOPLASTY WITH TURBINATE REDUCTION;  Surgeon: Carloyn Manner, MD;  Location: [facility];  Service: ENT;  Laterality: Bilateral;  . PROSTATE BIOPSY  2015  . WRIST FRACTURE SURGERY Right     Family History  Problem Relation Age of Onset  . Diabetes Mother   . Hypertension Father   . Cancer Maternal Grandmother     Lung-  Non-Smoker    Social History   Social History  . Marital status: Single    Spouse name: N/A  . Number of children: N/A  . Years of education: N/A   Occupational History  . Not on file.   Social History Main Topics  . Smoking status: Current Every Day Smoker    Packs/day: 0.50    Years: 20.00    Types: E-cigarettes  . Smokeless tobacco: Never Used     Comment: Uses E-Cigarettes  . Alcohol use No  . Drug use: No  . Sexual activity: Yes    Partners: Female   Other Topics Concern  . Not on file   Social History Narrative  . No narrative on file     Current Outpatient Prescriptions:  .  ibuprofen (ADVIL,MOTRIN) 800 MG tablet, , Disp: , Rfl: 0 .  SUMAtriptan  (IMITREX) 100 MG tablet, Take 1 tablet (100 mg total) by mouth every 2 (two) hours as needed for migraine. May repeat in 2 hours if headache persists or recurs., Disp: 10 tablet, Rfl: 0 .  Testosterone (ANDROGEL PUMP) 20.25 MG/ACT (1.62%) GEL, Apply 1 application topically daily., Disp: 75 g, Rfl: 0 .  albuterol (PROVENTIL HFA;VENTOLIN HFA) 108 (90 Base) MCG/ACT inhaler, Inhale 2 puffs into the lungs every 4 (four) hours as needed. (Patient not taking: Reported on 03/18/2016), Disp: 1 Inhaler, Rfl: 0 .  fluticasone (FLONASE) 50 MCG/ACT nasal spray, Place 2 sprays into both nostrils daily. (Patient not taking: Reported on 03/18/2016), Disp: 16 g, Rfl: 0 .  fluticasone furoate-vilanterol (BREO ELLIPTA) 100-25 MCG/INH AEPB, Inhale 1 puff into the lungs daily. (Patient not taking: Reported on 04/05/2016), Disp: 60 each, Rfl: 0  Allergies  Allergen Reactions  . Doxycycline Other (See Comments) and Nausea And Vomiting     Review of Systems  Constitutional: Positive for malaise/fatigue. Negative for chills and fever.  HENT: Negative for congestion, ear pain and sore throat.   Eyes: Negative for blurred vision and double vision.  Respiratory: Negative for cough and shortness of breath.   Cardiovascular:  Negative for chest pain and leg swelling.  Gastrointestinal: Negative for blood in stool, constipation, diarrhea, nausea and vomiting.  Genitourinary: Positive for frequency. Negative for dysuria, hematuria and urgency.  Musculoskeletal: Negative for back pain and neck pain.  Skin: Negative for rash.  Neurological: Negative for dizziness and headaches.  Psychiatric/Behavioral: Negative for depression. The patient is not nervous/anxious and does not have insomnia.       Objective  Vitals:   04/05/16 1456  BP: 112/80  Pulse: 93  Resp: 18  Temp: 98 F (36.7 C)  TempSrc: Oral  SpO2: 96%  Weight: 207 lb (93.9 kg)  Height: 5\' 10"  (1.778 m)    Physical Exam  Constitutional: He is oriented to  person, place, and time and well-developed, well-nourished, and in no distress.  HENT:  Head: Normocephalic and atraumatic.  Cardiovascular: Normal rate, regular rhythm and normal heart sounds.   No murmur heard. Pulmonary/Chest: Effort normal and breath sounds normal. He has no wheezes.  Abdominal: Soft. Bowel sounds are normal. He exhibits no distension. There is no tenderness.  Genitourinary:  Genitourinary Comments: Deferred for Urology.  Musculoskeletal: He exhibits no edema.  Neurological: He is alert and oriented to person, place, and time.  Psychiatric: Mood, memory, affect and judgment normal.  Nursing note and vitals reviewed.    Assessment & Plan  1. Encounter for annual physical exam  Obtain age appropriate laboratory workup - CBC with Differential/Platelet - COMPLETE METABOLIC PANEL WITH GFR - Lipid panel - TSH - VITAMIN D 25 Hydroxy (Vit-D Deficiency, Fractures)  2. Family history of type 2 diabetes mellitus in mother Point-of-care A1c is 5.5%, considered normal. - POCT glycosylated hemoglobin (Hb A1C)  3. Family history of colon cancer in mother Patient's mother had colon cancer diagnosed in her late 40s, we will recommend colonoscopy starting at age 71.  Altheia Shafran [facility] 04/05/2016 3:19 PM

## 2016-05-14 ENCOUNTER — Telehealth: Payer: Self-pay | Admitting: Family Medicine

## 2016-05-14 NOTE — Telephone Encounter (Signed)
PT IS ASKING FOR RESULTS OF HIS A1C. HE IS COMING IN THE NEXT FEW DAYS TO HAVE HIS LAB WORK DONE BUT WANTS TO KOW THE RESULTS OF THE A1C

## 2016-05-14 NOTE — Telephone Encounter (Signed)
Patient has been notified of A1C results

## 2016-05-17 ENCOUNTER — Other Ambulatory Visit: Payer: Self-pay | Admitting: Family Medicine

## 2016-05-17 DIAGNOSIS — R202 Paresthesia of skin: Secondary | ICD-10-CM | POA: Diagnosis not present

## 2016-05-17 DIAGNOSIS — Z0000 Encounter for general adult medical examination without abnormal findings: Secondary | ICD-10-CM | POA: Diagnosis not present

## 2016-05-17 LAB — CBC WITH DIFFERENTIAL/PLATELET
BASOS ABS: 0 {cells}/uL (ref 0–200)
Basophils Relative: 0 %
Eosinophils Absolute: 99 cells/uL (ref 15–500)
Eosinophils Relative: 1 %
HEMATOCRIT: 47.3 % (ref 38.5–50.0)
HEMOGLOBIN: 16.1 g/dL (ref 13.2–17.1)
LYMPHS ABS: 3168 {cells}/uL (ref 850–3900)
LYMPHS PCT: 32 %
MCH: 30.5 pg (ref 27.0–33.0)
MCHC: 34 g/dL (ref 32.0–36.0)
MCV: 89.6 fL (ref 80.0–100.0)
MONO ABS: 594 {cells}/uL (ref 200–950)
MPV: 9.7 fL (ref 7.5–12.5)
Monocytes Relative: 6 %
NEUTROS PCT: 61 %
Neutro Abs: 6039 cells/uL (ref 1500–7800)
Platelets: 262 10*3/uL (ref 140–400)
RBC: 5.28 MIL/uL (ref 4.20–5.80)
RDW: 14.4 % (ref 11.0–15.0)
WBC: 9.9 10*3/uL (ref 3.8–10.8)

## 2016-05-17 LAB — COMPLETE METABOLIC PANEL WITH GFR
ALBUMIN: 4.4 g/dL (ref 3.6–5.1)
ALK PHOS: 71 U/L (ref 40–115)
ALT: 9 U/L (ref 9–46)
AST: 21 U/L (ref 10–40)
BUN: 23 mg/dL (ref 7–25)
CALCIUM: 9.1 mg/dL (ref 8.6–10.3)
CHLORIDE: 106 mmol/L (ref 98–110)
CO2: 27 mmol/L (ref 20–31)
CREATININE: 1.19 mg/dL (ref 0.60–1.35)
GFR, Est African American: 86 mL/min (ref >=60)
GFR, Est Non African American: 74 mL/min (ref >=60)
GLUCOSE: 106 mg/dL — AB (ref 65–99)
Potassium: 4.4 mmol/L (ref 3.5–5.3)
SODIUM: 142 mmol/L (ref 135–146)
Total Bilirubin: 0.3 mg/dL (ref 0.2–1.2)
Total Protein: 6.9 g/dL (ref 6.1–8.1)

## 2016-05-17 LAB — LIPID PANEL
Cholesterol: 223 mg/dL — ABNORMAL HIGH (ref <200)
HDL: 33 mg/dL — ABNORMAL LOW (ref >40)
LDL CALC: 154 mg/dL — AB (ref <100)
Total CHOL/HDL Ratio: 6.8 Ratio — ABNORMAL HIGH (ref <5.0)
Triglycerides: 179 mg/dL — ABNORMAL HIGH (ref <150)
VLDL: 36 mg/dL — AB (ref <30)

## 2016-05-18 LAB — TSH: TSH: 1.39 m[IU]/L (ref 0.40–4.50)

## 2016-05-18 LAB — VITAMIN D 25 HYDROXY (VIT D DEFICIENCY, FRACTURES): VIT D 25 HYDROXY: 34 ng/mL (ref 30–100)

## 2016-05-20 ENCOUNTER — Encounter: Payer: Self-pay | Admitting: Family Medicine

## 2016-05-20 ENCOUNTER — Ambulatory Visit (INDEPENDENT_AMBULATORY_CARE_PROVIDER_SITE_OTHER): Payer: BLUE CROSS/BLUE SHIELD | Admitting: Family Medicine

## 2016-05-20 VITALS — BP 120/74 | HR 99 | Temp 98.2°F | Resp 16 | Wt 211.8 lb

## 2016-05-20 DIAGNOSIS — Z114 Encounter for screening for human immunodeficiency virus [HIV]: Secondary | ICD-10-CM | POA: Diagnosis not present

## 2016-05-20 DIAGNOSIS — R8290 Unspecified abnormal findings in urine: Secondary | ICD-10-CM

## 2016-05-20 DIAGNOSIS — R109 Unspecified abdominal pain: Secondary | ICD-10-CM | POA: Diagnosis not present

## 2016-05-20 LAB — POCT URINALYSIS DIPSTICK
BILIRUBIN UA: NEGATIVE
Glucose, UA: NEGATIVE
Ketones, UA: NEGATIVE
LEUKOCYTES UA: NEGATIVE
Nitrite, UA: NEGATIVE
PH UA: 5 (ref 5.0–8.0)
RBC UA: NEGATIVE
Spec Grav, UA: 1.015 (ref 1.030–1.035)
Urobilinogen, UA: 0.2 (ref ?–2.0)

## 2016-05-20 MED ORDER — METRONIDAZOLE 500 MG PO TABS: 500.0000 mg | ORAL_TABLET | Freq: Two times a day (BID) | ORAL | 0 refills | Status: AC

## 2016-05-20 NOTE — Patient Instructions (Addendum)
  Try to limit saturated fats in your diet (bologna, hot dogs, barbeque, cheeseburgers, hamburgers, steak, bacon, sausage, cheese, etc.) and get more fresh fruits, vegetables, and whole grains  Recheck urine with Dr. Manuella Ghazi in 2-3 months after low red meat diet Try to get 64-72 ounces of water a day Start the new antibiotic If your partner is having a thin watery discharge, she would benefit from seeing her doctor

## 2016-05-20 NOTE — Progress Notes (Signed)
BP 120/74   Pulse 99   Temp 98.2 F (36.8 C) (Oral)   Resp 16   Wt 211 lb 12.8 oz (96.1 kg)   SpO2 96%   BMI 30.39 kg/m    Subjective:    Patient ID: Jeffery [name], male    DOB: May 28, 1970, 45 y.o.   MRN: [id number]  HPI: Jeffery [name] is a 46 y.o. male  Chief Complaint  Patient presents with  . Urinary Tract Infection    hesitancy, abdominal cramping, with some discharge   Lower stomach cramp; hesistancy Urine smells almost fishy He has had the rash on the penis He went to urologist and then he was referred to derm He went to the dermatologist and was started on doxycycline Going to [place] in April for a specialist visit Suggested getting checked for STD Male partner has had some cramping PSA UTD Glucose 106  Depression screen [facility] 2/9 05/20/2016 03/18/2016 01/16/2015  Decreased Interest 0 0 0  Down, Depressed, Hopeless 0 0 0  PHQ - 2 Score 0 0 0   Relevant past medical, surgical, family and social history reviewed Past Medical History:  Diagnosis Date  . Dental crowns present    sides and back  . Dyslipidemia   . Enlarged prostate   . Epididymitis   . Headache    sinus  . Hypotestosteronism   . Nodular prostate with lower urinary tract symptoms   . Obesity   . Testosterone deficiency   . Umbilical hernia   . Umbilical hernia without obstruction or gangrene    Past Surgical History:  Procedure Laterality Date  . HERNIA REPAIR  7/61/95   umbilical hernia  . NASAL SEPTOPLASTY W/ TURBINOPLASTY Bilateral 11/23/2014   Procedure: NASAL SEPTOPLASTY WITH TURBINATE REDUCTION;  Surgeon: Carloyn Manner, MD;  Location: [facility];  Service: ENT;  Laterality: Bilateral;  . PROSTATE BIOPSY  2015  . WRIST FRACTURE SURGERY Right    Family History  Problem Relation Age of Onset  . Diabetes Mother   . Cancer Mother     Colon Cancer  . Hypertension Father   . Cancer Maternal Grandmother     Lung-  Non-Smoker   Social History  Substance Use Topics    . Smoking status: Current Every Day Smoker    Packs/day: 0.50    Years: 20.00    Types: E-cigarettes  . Smokeless tobacco: Never Used     Comment: Uses E-Cigarettes  . Alcohol use No    Interim medical history since last visit reviewed. Allergies and medications reviewed  Review of Systems Per HPI unless specifically indicated above     Objective:    BP 120/74   Pulse 99   Temp 98.2 F (36.8 C) (Oral)   Resp 16   Wt 211 lb 12.8 oz (96.1 kg)   SpO2 96%   BMI 30.39 kg/m   Wt Readings from Last 3 Encounters:  05/20/16 211 lb 12.8 oz (96.1 kg)  04/05/16 207 lb (93.9 kg)  03/18/16 213 lb 7 oz (96.8 kg)    Physical Exam  Constitutional: He appears well-developed and well-nourished. No distress.  Eyes: No scleral icterus.  Cardiovascular: Normal rate.   Pulmonary/Chest: Effort normal.  Abdominal: Soft. Bowel sounds are normal. He exhibits no distension. There is no guarding.  Neurological: He is alert.  Psychiatric: He has a normal mood and affect.      Assessment & Plan:   Problem List Items Addressed This Visit  None    Visit Diagnoses    Bad odor of urine    -  Primary   Relevant Orders   POCT urinalysis dipstick (Completed)   GC/Chlamydia Probe Amp (Completed)   Trichomonas vaginalis RNA, Ql,Males (Completed)   Screening for HIV (human immunodeficiency virus)       Relevant Orders   HIV antibody (with reflex) (Completed)   Abdominal cramping       Relevant Orders   GC/Chlamydia Probe Amp (Completed)   Trichomonas vaginalis RNA, Ql,Males (Completed)      Follow up plan: No Follow-up on file.  An after-visit summary was printed and given to the patient at [facility].  Please see the patient instructions which may contain other information and recommendations beyond what is mentioned above in the assessment and plan.  Meds ordered this encounter  Medications  . doxycycline (VIBRAMYCIN) 50 MG capsule    Sig: Take 50 mg by mouth daily.    Refill:  3   . carvedilol (COREG) 6.25 MG tablet    Sig: Take 6.25 mg by mouth daily.    Refill:  1  . metroNIDAZOLE (FLAGYL) 500 MG tablet    Sig: Take 1 tablet (500 mg total) by mouth 2 (two) times daily.    Dispense:  14 tablet    Refill:  0    Orders Placed This Encounter  Procedures  . GC/Chlamydia Probe Amp  . HIV antibody (with reflex)  . POCT urinalysis dipstick

## 2016-05-21 LAB — TRICHOMONAS VAGINALIS RNA, QL,MALES: Trichomonas vaginalis RNA: NOT DETECTED

## 2016-05-21 LAB — HIV ANTIBODY (ROUTINE TESTING W REFLEX): HIV 1&2 Ab, 4th Generation: NONREACTIVE

## 2016-05-21 LAB — GC/CHLAMYDIA PROBE AMP
CT PROBE, AMP APTIMA: NOT DETECTED
GC PROBE AMP APTIMA: NOT DETECTED

## 2016-06-04 DIAGNOSIS — N400 Enlarged prostate without lower urinary tract symptoms: Secondary | ICD-10-CM | POA: Diagnosis not present

## 2016-06-04 DIAGNOSIS — M5441 Lumbago with sciatica, right side: Secondary | ICD-10-CM | POA: Diagnosis not present

## 2016-06-04 DIAGNOSIS — F17210 Nicotine dependence, cigarettes, uncomplicated: Secondary | ICD-10-CM | POA: Diagnosis not present

## 2016-06-04 DIAGNOSIS — Z6830 Body mass index (BMI) 30.0-30.9, adult: Secondary | ICD-10-CM | POA: Diagnosis not present

## 2016-06-13 DIAGNOSIS — M533 Sacrococcygeal disorders, not elsewhere classified: Secondary | ICD-10-CM | POA: Diagnosis not present

## 2016-06-13 DIAGNOSIS — N50811 Right testicular pain: Secondary | ICD-10-CM | POA: Diagnosis not present

## 2016-06-13 DIAGNOSIS — M2550 Pain in unspecified joint: Secondary | ICD-10-CM | POA: Diagnosis not present

## 2016-06-13 DIAGNOSIS — F17210 Nicotine dependence, cigarettes, uncomplicated: Secondary | ICD-10-CM | POA: Diagnosis not present

## 2016-06-13 DIAGNOSIS — M5441 Lumbago with sciatica, right side: Secondary | ICD-10-CM | POA: Diagnosis not present

## 2016-06-13 DIAGNOSIS — N433 Hydrocele, unspecified: Secondary | ICD-10-CM | POA: Diagnosis not present

## 2016-06-14 DIAGNOSIS — R109 Unspecified abdominal pain: Secondary | ICD-10-CM | POA: Diagnosis not present

## 2016-06-14 DIAGNOSIS — N50819 Testicular pain, unspecified: Secondary | ICD-10-CM | POA: Diagnosis not present

## 2016-06-14 DIAGNOSIS — R399 Unspecified symptoms and signs involving the genitourinary system: Secondary | ICD-10-CM | POA: Diagnosis not present

## 2016-06-14 HISTORY — DX: Unspecified symptoms and signs involving the genitourinary system: R39.9

## 2016-06-26 DIAGNOSIS — R109 Unspecified abdominal pain: Secondary | ICD-10-CM | POA: Diagnosis not present

## 2016-07-08 ENCOUNTER — Telehealth: Payer: Self-pay | Admitting: Family Medicine

## 2016-07-08 NOTE — Telephone Encounter (Signed)
Pt was seen earlier this year for his annual physical and it was suggested that he do a colonoscopy, however, at the time he had not met his deductible. Now that it is met he is asking for the referral for the colonoscopy. [phone] 

## 2016-07-09 DIAGNOSIS — M5441 Lumbago with sciatica, right side: Secondary | ICD-10-CM | POA: Diagnosis not present

## 2016-07-10 NOTE — Telephone Encounter (Signed)
Pt was seen earlier this year for his annual physical and it was suggested that he do a colonoscopy, however, at the time he had not met his deductible. Now that it is met he is asking for the referral for the colonoscopy. [phone] 

## 2016-07-10 NOTE — Telephone Encounter (Signed)
Patient is not due for colonoscopy at this age, his mother had colon cancer diagnosed in her late 26s so he should have colonoscopy starting at age 46. please review my office visit note.

## 2016-07-12 NOTE — Telephone Encounter (Signed)
Patient has been notified

## 2016-07-15 DIAGNOSIS — M5441 Lumbago with sciatica, right side: Secondary | ICD-10-CM | POA: Diagnosis not present

## 2016-07-15 DIAGNOSIS — M4726 Other spondylosis with radiculopathy, lumbar region: Secondary | ICD-10-CM | POA: Diagnosis not present

## 2016-07-23 DIAGNOSIS — M545 Low back pain: Secondary | ICD-10-CM | POA: Diagnosis not present

## 2016-08-02 ENCOUNTER — Encounter: Payer: Self-pay | Admitting: Family Medicine

## 2016-08-02 ENCOUNTER — Ambulatory Visit (INDEPENDENT_AMBULATORY_CARE_PROVIDER_SITE_OTHER): Payer: BLUE CROSS/BLUE SHIELD | Admitting: Family Medicine

## 2016-08-02 VITALS — BP 122/70 | HR 92 | Temp 97.7°F | Resp 18 | Ht 70.0 in | Wt 209.3 lb

## 2016-08-02 DIAGNOSIS — R195 Other fecal abnormalities: Secondary | ICD-10-CM

## 2016-08-02 DIAGNOSIS — K921 Melena: Secondary | ICD-10-CM | POA: Diagnosis not present

## 2016-08-02 DIAGNOSIS — R198 Other specified symptoms and signs involving the digestive system and abdomen: Secondary | ICD-10-CM

## 2016-08-02 LAB — HEMOCCULT GUIAC POC 1CARD (OFFICE): FECAL OCCULT BLD: POSITIVE — AB

## 2016-08-02 NOTE — Progress Notes (Addendum)
Name: Jeffery [name]   MRN: [id number]    DOB: 11-29-1970   Date:08/02/2016       Progress Note  Subjective  Chief Complaint  Chief Complaint  Patient presents with  . Rectal Bleeding    with mucous    HPI  Pt notes new onset blood in stool x2-3 weeks and having mucous in stools x2-3 months. Endorses tenesmus, and having to strain to have BM's multiple times a day.  Sometimes he has the urge to go and strains and only has mucous coming out. When blood is present, it is usually a dark red, had one episiode of bright red, pt notes when blood is present it is a small amount, with one episode of moderate amount of blood last week. No nausea, vomiting; occasional abdominal pain, weakness, or unintentional weightloss. Pt has BPH and takes flomax and cialis daily - sees urology for this issue.   He has also had some right sided low back pain for which he went to the [facility] and had an MRI and was told he needed to do do PT for his back pain.  No lightheadedness or positional dizziness.  Family History: Mother had colon cancer and is still living at 44yo, Paternal Uncle died at 44 from colon cancer. Maternal great-aunt had Crohn Disease.  Patient Active Problem List   Diagnosis Date Noted  . Family history of type 2 diabetes mellitus in mother 04/05/2016  . Urinary incontinence 03/18/2016  . 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 Dyslipidemia 03/10/2015  . 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 Hypotestosteronism 03/10/2015  . Opioid type dependence, abuse ([place]) 03/10/2015  . Adiposity 03/10/2015  . Acute upper back pain 12/23/2014  . Encounter for annual physical exam 12/23/2014  . History of nasal septoplasty 11/23/2014  . Chronic prostatitis 10/21/2014  . BPH (benign prostatic hypertrophy) 10/21/2014  . Obesity 10/21/2014  . Umbilical hernia 55/73/2202    Social History  Substance Use Topics  . Smoking status: Current Every Day Smoker     Packs/day: 0.50    Years: 20.00    Types: E-cigarettes  . Smokeless tobacco: Never Used     Comment: Uses E-Cigarettes  . Alcohol use No    Current Outpatient Prescriptions:  .  carvedilol (COREG) 6.25 MG tablet, Take 6.25 mg by mouth daily., Disp: , Rfl: 1 .  ibuprofen (ADVIL,MOTRIN) 800 MG tablet, , Disp: , Rfl: 0 .  tadalafil (CIALIS) 5 MG tablet, Take by mouth., Disp: , Rfl:  .  tamsulosin (FLOMAX) 0.4 MG CAPS capsule, Take by mouth., Disp: , Rfl:  .  Testosterone (ANDROGEL PUMP) 20.25 MG/ACT (1.62%) GEL, Apply 1 application topically daily., Disp: 75 g, Rfl: 0 .  SUMAtriptan (IMITREX) 100 MG tablet, Take 1 tablet (100 mg total) by mouth every 2 (two) hours as needed for migraine. May repeat in 2 hours if headache persists or recurs. (Patient not taking: Reported on 08/02/2016), Disp: 10 tablet, Rfl: 0  Allergies  Allergen Reactions  . Doxycycline Other (See Comments) and Nausea And Vomiting    ROS  Ten systems reviewed and is negative except as mentioned in HPI  Objective  Vitals:   08/02/16 0909  BP: 122/70  Pulse: 92  Resp: 18  Temp: 97.7 F (36.5 C)  TempSrc: Oral  SpO2: 97%  Weight: 209 lb 4.8 oz (94.9 kg)  Height: _0  (1.778 m)    Body mass index is 30.03 kg/m.  Nursing Note and Vital  Signs reviewed.  Physical Exam  Constitutional: Patient appears well-developed and well-nourished. No distress.  HEENT: head atraumatic, normocephalic Cardiovascular: Normal rate, regular rhythm, S1/S2 present.  No murmur or rub heard. No BLE edema. Pulmonary/Chest: Effort normal and breath sounds clear. No respiratory distress or retractions. Abdominal: Soft and non-tender, bowel sounds present x4 quadrants. Psychiatric: Patient has a normal mood and affect. behavior is normal. Judgment and thought content normal. RECTAL: Prostate enlarged with normal consistency, no rectal masses or hemorrhoids.   Recent Results (from the past 2160 hour(s))  COMPLETE METABOLIC PANEL  WITH GFR     Status: Abnormal   Collection Time: 05/17/16  8:16 AM  Result Value Ref Range   Sodium 142 135 - 146 mmol/L   Potassium 4.4 3.5 - 5.3 mmol/L   Chloride 106 98 - 110 mmol/L   CO2 27 20 - 31 mmol/L   Glucose, Bld 106 (H) 65 - 99 mg/dL   BUN 23 7 - 25 mg/dL   Creat 1.19 0.60 - 1.35 mg/dL   Total Bilirubin 0.3 0.2 - 1.2 mg/dL   Alkaline Phosphatase 71 40 - 115 U/L   AST 21 10 - 40 U/L   ALT 9 9 - 46 U/L   Total Protein 6.9 6.1 - 8.1 g/dL   Albumin 4.4 3.6 - 5.1 g/dL   Calcium 9.1 8.6 - 10.3 mg/dL   GFR, Est African American 86 >=60 mL/min   GFR, Est Non African American 74 >=60 mL/min  CBC with Differential/Platelet     Status: None   Collection Time: 05/17/16  8:16 AM  Result Value Ref Range   WBC 9.9 3.8 - 10.8 K/uL   RBC 5.28 4.20 - 5.80 MIL/uL   Hemoglobin 16.1 13.2 - 17.1 g/dL   HCT 47.3 38.5 - 50.0 %   MCV 89.6 80.0 - 100.0 fL   MCH 30.5 27.0 - 33.0 pg   MCHC 34.0 32.0 - 36.0 g/dL   RDW 14.4 11.0 - 15.0 %   Platelets 262 140 - 400 K/uL   MPV 9.7 7.5 - 12.5 fL   Neutro Abs 6,039 1,500 - 7,800 cells/uL   Lymphs Abs 3,168 850 - 3,900 cells/uL   Monocytes Absolute 594 200 - 950 cells/uL   Eosinophils Absolute 99 15 - 500 cells/uL   Basophils Absolute 0 0 - 200 cells/uL   Neutrophils Relative % 61 %   Lymphocytes Relative 32 %   Monocytes Relative 6 %   Eosinophils Relative 1 %   Basophils Relative 0 %   Smear Review Criteria for review not met   Lipid panel     Status: Abnormal   Collection Time: 05/17/16  8:16 AM  Result Value Ref Range   Cholesterol 223 (H) <200 mg/dL   Triglycerides 179 (H) <150 mg/dL   HDL 33 (L) >40 mg/dL   Total CHOL/HDL Ratio 6.8 (H) <5.0 Ratio   VLDL 36 (H) <30 mg/dL   LDL Cholesterol 154 (H) <100 mg/dL  TSH     Status: None   Collection Time: 05/17/16  8:16 AM  Result Value Ref Range   TSH 1.39 0.40 - 4.50 mIU/L  VITAMIN D 25 Hydroxy (Vit-D Deficiency, Fractures)     Status: None   Collection Time: 05/17/16  8:16 AM   Result Value Ref Range   Vit D, 25-Hydroxy 34 30 - 100 ng/mL    Comment: Vitamin D Status           25-OH Vitamin D  Deficiency                <20 ng/mL        Insufficiency         20 - 29 ng/mL        Optimal             > or = 30 ng/mL   For 25-OH Vitamin D testing on patients on D2-supplementation and patients for whom quantitation of D2 and D3 fractions is required, the [employer] 25-OH VIT D, (D2,D3), LC/MS/MS is recommended: order code [phone] (patients > 2 yrs).   POCT urinalysis dipstick     Status: Abnormal   Collection Time: 05/20/16  2:12 PM  Result Value Ref Range   Color, UA yellow    Clarity, UA clear    Glucose, UA neg    Bilirubin, UA neg    Ketones, UA neg    Spec Grav, UA 1.015 1.030 - 1.035   Blood, UA neg    pH, UA 5.0 5.0 - 8.0   Protein, UA trace    Urobilinogen, UA 0.2 Negative - 2.0   Nitrite, UA neg    Leukocytes, UA Negative Negative  GC/Chlamydia Probe Amp     Status: None   Collection Time: 05/20/16  2:24 PM  Result Value Ref Range   CT Probe RNA NOT DETECTED     Comment:                    **Normal Reference Range: NOT DETECTED**   This test was performed using the APTIMA COMBO2 Assay ([employer]   The analytical performance characteristics of this assay, when used to test SurePath specimens have been determined by [facility]      GC Probe RNA NOT DETECTED     Comment:                    **Normal Reference Range: NOT DETECTED**   This test was performed using the APTIMA COMBO2 Assay ([employer]   The analytical performance characteristics of this assay, when used to test SurePath specimens have been determined by [facility]     Trichomonas vaginalis RNA, Ql,Males     Status: None   Collection Time: 05/20/16  2:24 PM  Result Value Ref Range   Trichomonas vaginalis RNA Not Detected Not Detected    Comment:   This test was performed using the APTIMA(R) Trichomonas vaginalis Assay (GEN-PROBE(R)). The  analytical performance characteristics of this assay have been determined by [employer]. The modifications have not been cleared or approved by the FDA. This assay has been validated pursuant to the CLIA regulations and is used for clinical purposes.   For more information on this test, go to: http://education.[facility].com/faq/Trichomonastma   HIV antibody (with reflex)     Status: None   Collection Time: 05/20/16  2:38 PM  Result Value Ref Range   HIV 1&2 Ab, 4th Generation NONREACTIVE NONREACTIVE    Comment:   HIV-1 antigen and HIV-1/HIV-2 antibodies were not detected.  There is no laboratory evidence of HIV infection.   HIV-1/2 Antibody Diff        Not indicated. HIV-1 RNA, Qual TMA          Not indicated.     PLEASE NOTE: This information has been disclosed to you from records whose confidentiality may be protected by state law. If your state requires such protection, then the state law prohibits you from making any  further disclosure of the information without the specific written consent of the person to whom it pertains, or as otherwise permitted by law. A general authorization for the release of medical or other information is NOT sufficient for this purpose.   The performance of this assay has not been clinically validated in patients less than 49 years old.   For additional information please refer to http://education.[facility].com/faq/FAQ106.  (This link is being provided for informational/educational purposes only.)        Assessment & Plan  1. Hematochezia  - Ambulatory referral to Gastroenterology - POCT Occult Blood Stool - POSITIVE result.  2. Mucus in stool  - Ambulatory referral to Gastroenterology  3. Tenesmus (rectal)  - Ambulatory referral to Gastroenterology  -GI appt with Dr. Tiffany Kocher scheduled for Monday 08/05/2016 at 10:00am. Pt is made aware of appointment prior to leaving office today.  -Red flags and when to  present for emergency care or RTC including fever >101.4F, chest pain, shortness of breath, new/worsening/un-resolving symptoms, abdominal pain, near-syncope, increased rectal bleeding, positional lightheadedness reviewed with patient at time of visit. Follow up and care instructions discussed and provided in AVS.  I have reviewed this encounter including the documentation in this note and/or discussed this patient with the Johney Maine, FNP, NP-C. I am certifying that I agree with the content of this note as supervising physician.  Steele Sizer, MD [facility] 08/04/2016, 11:47 AM

## 2016-08-04 NOTE — Addendum Note (Signed)
Addended by: Steele Sizer on: 08/04/2016 11:47 AM   Modules accepted: Level of Service

## 2016-08-05 DIAGNOSIS — K921 Melena: Secondary | ICD-10-CM | POA: Diagnosis not present

## 2016-08-05 DIAGNOSIS — Z8371 Family history of colonic polyps: Secondary | ICD-10-CM | POA: Diagnosis not present

## 2016-08-05 DIAGNOSIS — R198 Other specified symptoms and signs involving the digestive system and abdomen: Secondary | ICD-10-CM | POA: Diagnosis not present

## 2016-08-05 DIAGNOSIS — R195 Other fecal abnormalities: Secondary | ICD-10-CM | POA: Diagnosis not present

## 2016-09-12 DIAGNOSIS — D126 Benign neoplasm of colon, unspecified: Secondary | ICD-10-CM | POA: Diagnosis not present

## 2016-09-12 DIAGNOSIS — D124 Benign neoplasm of descending colon: Secondary | ICD-10-CM | POA: Diagnosis not present

## 2016-09-12 DIAGNOSIS — K635 Polyp of colon: Secondary | ICD-10-CM | POA: Diagnosis not present

## 2016-09-12 DIAGNOSIS — D123 Benign neoplasm of transverse colon: Secondary | ICD-10-CM | POA: Diagnosis not present

## 2016-09-12 DIAGNOSIS — R195 Other fecal abnormalities: Secondary | ICD-10-CM | POA: Diagnosis not present

## 2016-09-12 DIAGNOSIS — K649 Unspecified hemorrhoids: Secondary | ICD-10-CM | POA: Diagnosis not present

## 2016-09-12 DIAGNOSIS — D129 Benign neoplasm of anus and anal canal: Secondary | ICD-10-CM | POA: Diagnosis not present

## 2016-09-19 DIAGNOSIS — R399 Unspecified symptoms and signs involving the genitourinary system: Secondary | ICD-10-CM | POA: Diagnosis not present

## 2016-09-19 DIAGNOSIS — E291 Testicular hypofunction: Secondary | ICD-10-CM | POA: Diagnosis not present

## 2016-09-19 DIAGNOSIS — R109 Unspecified abdominal pain: Secondary | ICD-10-CM | POA: Diagnosis not present

## 2016-09-21 ENCOUNTER — Ambulatory Visit
Admission: EM | Admit: 2016-09-21 | Discharge: 2016-09-21 | Disposition: A | Discharge status: Home / Self Care | Payer: BLUE CROSS/BLUE SHIELD

## 2016-09-21 DIAGNOSIS — K047 Periapical abscess without sinus: Secondary | ICD-10-CM | POA: Diagnosis not present

## 2016-09-21 DIAGNOSIS — K1379 Other lesions of oral mucosa: Secondary | ICD-10-CM | POA: Diagnosis not present

## 2016-09-21 DIAGNOSIS — Z9889 Other specified postprocedural states: Secondary | ICD-10-CM

## 2016-09-21 DIAGNOSIS — R6884 Jaw pain: Secondary | ICD-10-CM | POA: Diagnosis not present

## 2016-09-21 MED ORDER — AMOXICILLIN-POT CLAVULANATE 875-125 MG PO TABS: 1.0000 | ORAL_TABLET | Freq: Two times a day (BID) | ORAL | 0 refills | Status: AC

## 2016-09-21 NOTE — ED Provider Notes (Signed)
CSN: [id number]     Arrival date & time 09/21/16  1148 History   First MD Initiated Contact with Patient 09/21/16 1208     Chief Complaint  Patient presents with  . Jaw Pain   (Consider location/radiation/quality/duration/timing/severity/associated sxs/prior Treatment) Patient is a 46 year old male Who presents with complaint of pain and swelling to his right jaw. Patient states he had initial part of her root canal done on Thursday. He reports that they were only able to do the passageway part due to infection. He was started on amoxicillin for infection and given Percocet and 800 mg ibuprofen for pain. States that he's been alternating the ibuprofen and the Percocet. Reports taking the amoxicillin as prescribed. He reports pain tenderness 10 despite the pain medications and states that he has some increased swelling along the underside of the right jaw. Patient states he contacted his dentist was advised to come in see had increased swelling. Patient denies any difficulty breathing and is able swallow.      Past Medical History:  Diagnosis Date  . Dental crowns present    sides and back  . Dyslipidemia   . Enlarged prostate   . Epididymitis   . Headache    sinus  . Hypotestosteronism   . Nodular prostate with lower urinary tract symptoms   . Obesity   . Testosterone deficiency   . Umbilical hernia   . Umbilical hernia without obstruction or gangrene    Past Surgical History:  Procedure Laterality Date  . HERNIA REPAIR  1/63/84   umbilical hernia  . NASAL SEPTOPLASTY W/ TURBINOPLASTY Bilateral 11/23/2014   Procedure: NASAL SEPTOPLASTY WITH TURBINATE REDUCTION;  Surgeon: Carloyn Manner, MD;  Location: [facility];  Service: ENT;  Laterality: Bilateral;  . PROSTATE BIOPSY  2015  . ROOT CANAL    . WRIST FRACTURE SURGERY Right    Family History  Problem Relation Age of Onset  . Diabetes Mother   . Cancer Mother        Colon Cancer  . Hypertension Father   . Cancer  Maternal Grandmother        Lung-  Non-Smoker   Social History  Substance Use Topics  . Smoking status: Current Every Day Smoker    Packs/day: 1.00    Years: 20.00    Types: Cigarettes  . Smokeless tobacco: Never Used     Comment: Uses E-Cigarettes  . Alcohol use No    Review of Systems  As above in history of present illness, other systems reviewed and found to be negative.   Allergies  Doxycycline  Home Medications   Prior to Admission medications   Medication Sig Start Date End Date Taking? Authorizing Provider  oxyCODONE-acetaminophen (PERCOCET/ROXICET) 5-325 MG tablet Take by mouth every 4 (four) hours as needed for severe pain.   Yes [provider]  amoxicillin-clavulanate (AUGMENTIN) 875-125 MG tablet Take 1 tablet by mouth every 12 (twelve) hours. 09/21/16 10/01/16  Luvenia Redden, PA-C  carvedilol (COREG) 6.25 MG tablet Take 6.25 mg by mouth daily. 05/12/16   [provider]  ibuprofen (ADVIL,MOTRIN) 800 MG tablet  02/27/16   [provider]  SUMAtriptan (IMITREX) 100 MG tablet Take 1 tablet (100 mg total) by mouth every 2 (two) hours as needed for migraine. May repeat in 2 hours if headache persists or recurs. Patient not taking: Reported on 08/02/2016 03/10/15   Ashok Norris, MD  tadalafil (CIALIS) 5 MG tablet Take by mouth.    [provider]  tamsulosin (FLOMAX) 0.4 MG CAPS capsule Take by mouth.    [provider]  Testosterone (ANDROGEL PUMP) 20.25 MG/ACT (1.62%) GEL Apply 1 application topically daily. 01/23/15   Steele Sizer, MD   Meds Ordered and Administered this Visit  Medications - No data to display  BP 122/82 (BP Location: Right Arm)   Pulse (!) 111   Temp 98.9 F (37.2 C) (Oral)   Resp 20   Ht 5\' 10"  (1.778 m)   Wt 210 lb (95.3 kg)   SpO2 99%   BMI 30.13 kg/m  No data found.   Physical Exam  Constitutional: He appears well-developed and well-nourished. He appears distressed.  HENT:  Head:  Normocephalic and atraumatic.  Mouth/Throat: Uvula is midline.  Some redness and swelling to the back end of the right lower jaw. Some exudate noted. Swelling with tenderness noted palpation below the jawline under the chin. Back of the throat is clear and airway is not compromised. Uvula is midline  Eyes: Pupils are equal, round, and reactive to light. EOM are normal.  Neck: Normal range of motion.    Urgent Care Course     Procedures (including critical care time)  Labs Review Labs Reviewed - No data to display  Imaging Review No results found.   MDM   1. Infection of tooth   2. Mouth pain   3. History of recent dental procedure    Patient presents with jaw pain and swelling related to his root canal. Patient has been on amoxicillin since Thursday. We'll go ahead and change him to Augmentin. Patient advised to continue his current Percocet and ibuprofen regimen for his pain. Patient advised she can try warm compresses as well as warm salt water gargles. Patient given signs of worsening abscess. Patient advised should he not be feeling any better by tomorrow afternoon or early evening at the latest or should he see signs of worsening abscess or infection, he should present to the ER for further evaluation and management. Patient also advised to contact his provider Monday morning for follow-up.   Luvenia Redden, PA-C     Luvenia Redden, PA-C 09/21/16 [phone]

## 2016-09-21 NOTE — Discharge Instructions (Signed)
-  Stop taking amoxicillin -Start taking Augmentin 1 tablet twice a day -Continue taking ibuprofen and Percocet as prescribed -Can try warm compresses and warm start water gargles -Watch for signs of worsening infection or abscess. These include continued pain, worsening swelling, difficulty breathing, or movement of the uvula to one side or the other. -Procedure symptoms not be improved by tomorrow afternoon, tomorrow evening at the latest or should she experience symptoms of worsening abscess, with present to a local emergency room for further evaluation and possible IV antibiotics. -Follow with dental provider in the morning.

## 2016-09-21 NOTE — ED Triage Notes (Addendum)
Pt with right lower jaw root canal on Thursday. Has pain 10/10 and is taking Percocet and Amoxicillin. Has noticed swelling to the area and called the Dr. Who told him to come and be evaluated. Pt is handling secretion and observed swallowing in triage.

## 2016-11-07 DIAGNOSIS — R399 Unspecified symptoms and signs involving the genitourinary system: Secondary | ICD-10-CM | POA: Diagnosis not present

## 2016-12-17 ENCOUNTER — Ambulatory Visit: Payer: BLUE CROSS/BLUE SHIELD | Admitting: Family Medicine

## 2016-12-18 ENCOUNTER — Ambulatory Visit (INDEPENDENT_AMBULATORY_CARE_PROVIDER_SITE_OTHER): Payer: BLUE CROSS/BLUE SHIELD | Admitting: Family Medicine

## 2016-12-18 ENCOUNTER — Encounter: Payer: Self-pay | Admitting: Family Medicine

## 2016-12-18 VITALS — BP 124/72 | HR 74 | Temp 97.6°F | Resp 18 | Ht 70.0 in | Wt 214.5 lb

## 2016-12-18 DIAGNOSIS — N138 Other obstructive and reflux uropathy: Secondary | ICD-10-CM

## 2016-12-18 DIAGNOSIS — N411 Chronic prostatitis: Secondary | ICD-10-CM | POA: Diagnosis not present

## 2016-12-18 DIAGNOSIS — N401 Enlarged prostate with lower urinary tract symptoms: Secondary | ICD-10-CM

## 2016-12-18 DIAGNOSIS — M545 Low back pain, unspecified: Secondary | ICD-10-CM

## 2016-12-18 DIAGNOSIS — J0100 Acute maxillary sinusitis, unspecified: Secondary | ICD-10-CM | POA: Diagnosis not present

## 2016-12-18 DIAGNOSIS — Z23 Encounter for immunization: Secondary | ICD-10-CM

## 2016-12-18 DIAGNOSIS — R3911 Hesitancy of micturition: Secondary | ICD-10-CM

## 2016-12-18 LAB — POCT URINALYSIS DIPSTICK
Bilirubin, UA: NEGATIVE
GLUCOSE UA: NEGATIVE
KETONES UA: NEGATIVE
Leukocytes, UA: NEGATIVE
Nitrite, UA: NEGATIVE
Protein, UA: NEGATIVE
RBC UA: NEGATIVE
SPEC GRAV UA: 1.015 (ref 1.010–1.025)
UROBILINOGEN UA: 0.2 U/dL
pH, UA: 5 (ref 5.0–8.0)

## 2016-12-18 MED ORDER — AMOXICILLIN-POT CLAVULANATE 875-125 MG PO TABS: 1.0000 | ORAL_TABLET | Freq: Two times a day (BID) | ORAL | 0 refills | Status: DC

## 2016-12-18 MED ORDER — AMOXICILLIN-POT CLAVULANATE 875-125 MG PO TABS: 1.0000 | ORAL_TABLET | Freq: Two times a day (BID) | ORAL | 0 refills | Status: AC

## 2016-12-18 NOTE — Progress Notes (Signed)
Name: Jeffery [name]   MRN: [id number]    DOB: 29-Mar-1970   Date:12/18/2016       Progress Note  Subjective  Chief Complaint  Chief Complaint  Patient presents with  . Sinusitis    cough, nasal drainage 1 month  . Benign Prostatic Hypertrophy    recheck prostate infection from 1 month ago  . Immunizations    Tdap    HPI  Sinus Congestion: Presents with complaint of sinus congestion, maxillary sinus pain, and sinus/frontal headache for about a month. Endorses one episode of LEFT ear pain that has since resolved.  Non-productive cough present. No NVD, fevers/chills, shortness of breath, or chest pain.  Cannot take antihistamines or flonase because it can affect his BPH.   Prostate Concerns: Pt has chronic prostatitis - has seen Dr. Yves Dill with [facility], and currently sees Ms. Amy Ted Mcalpine PA with [facility]. Was treated with 21 day course of Levaquin which he finished 11/29/2016. He had to reschedule appointment with [facility]rology on October 19th - unable to be seen for another month now.  Symptoms went away after abx tx, however, over the last 1-2 weeks, he is having intermittent difficulty initiating urine stream, is still straining to urinate, and has feeling of incomplete emptying.  Also having right-sided low back pain - has been working 90-hours/week and thinks this could be contributing - no numbness/tingling/extremity weakness, no flank pain or history of nephrolithiasis. No fevers/chills, no abdominal pain, NVD.  He does note that urology advised his symptoms may be related to dysfunction in pelvic floor muscles, but he has not been able to schedule PT due to his work schedule. Taking flomax, androgel, and cialis daily.  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 Also due for flu shot - this will be given today.  Patient Active Problem List   Diagnosis Date Noted  . Family history of type 2 diabetes mellitus  in mother 04/05/2016  . Urinary incontinence 03/18/2016  . 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 Dyslipidemia 03/10/2015  . 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 Hypotestosteronism 03/10/2015  . Opioid type dependence, abuse ([place]) 03/10/2015  . Adiposity 03/10/2015  . Acute upper back pain 12/23/2014  . Encounter for annual physical exam 12/23/2014  . History of nasal septoplasty 11/23/2014  . Chronic prostatitis 10/21/2014  . BPH (benign prostatic hypertrophy) 10/21/2014  . Obesity 10/21/2014  . Umbilical hernia 41/66/0630    Social History  Substance Use Topics  . Smoking status: Current Every Day Smoker    Packs/day: 1.00    Years: 20.00    Types: Cigarettes  . Smokeless tobacco: Never Used     Comment: Uses E-Cigarettes  . Alcohol use No     Current Outpatient Prescriptions:  .  ibuprofen (ADVIL,MOTRIN) 800 MG tablet, , Disp: , Rfl: 0 .  tadalafil (CIALIS) 5 MG tablet, Take by mouth., Disp: , Rfl:  .  Testosterone (ANDROGEL PUMP) 20.25 MG/ACT (1.62%) GEL, Apply 1 application topically daily., Disp: 75 g, Rfl: 0 .  amoxicillin-clavulanate (AUGMENTIN) 875-125 MG tablet, Take 1 tablet by mouth 2 (two) times daily., Disp: 20 tablet, Rfl: 0 .  carvedilol (COREG) 6.25 MG tablet, Take 6.25 mg by mouth daily., Disp: , Rfl: 1 .  oxyCODONE-acetaminophen (PERCOCET/ROXICET) 5-325 MG tablet, Take by mouth every 4 (four) hours as needed for severe pain., Disp: , Rfl:  .  SUMAtriptan (IMITREX) 100  MG tablet, Take 1 tablet (100 mg total) by mouth every 2 (two) hours as needed for migraine. May repeat in 2 hours if headache persists or recurs. (Patient not taking: Reported on 08/02/2016), Disp: 10 tablet, Rfl: 0 .  tamsulosin (FLOMAX) 0.4 MG CAPS capsule, Take by mouth., Disp: , Rfl:   Allergies  Allergen Reactions  . Doxycycline Other (See Comments) and Nausea And Vomiting    ROS  Constitutional: Negative for fever or weight change.  Respiratory:  Negative for cough and shortness of breath.   Cardiovascular: Negative for chest pain or palpitations.  Gastrointestinal: Negative for abdominal pain, no bowel changes.  Musculoskeletal: Negative for gait problem or joint swelling.  Skin: Negative for rash.  Neurological: Negative for dizziness or headache.  No other specific complaints in a complete review of systems (except as listed in HPI above).  Objective  Vitals:   12/18/16 0853  BP: 124/72  Pulse: 74  Resp: 18  Temp: 97.6 F (36.4 C)  TempSrc: Oral  SpO2: 98%  Weight: 214 lb 8 oz (97.3 kg)  Height: 5\' 10"  (1.778 m)   Body mass index is 30.78 kg/m.  Nursing Note and Vital Signs reviewed.  Physical Exam Constitutional: Patient appears well-developed and well-nourished. Obese  No distress.  HEENT: head atraumatic, normocephalic, pupils equal and reactive to light, EOM's intact, RIGHT TM with mild erythema and slightly retracted; bilateral maxillary sinuses are tender on palpation; no frontal sinus pain on palpation, neck supple with small amount of right-sided lymphadenopathy, oropharynx pink and moist without exudate. Cardiovascular: Normal rate, regular rhythm, S1/S2 present.  No murmur or rub heard. No BLE edema. Pulmonary/Chest: Effort normal and breath sounds clear. No respiratory distress or retractions. Abdominal: Soft and non-tender, bowel sounds present x4 quadrants.  No CVA Tenderness Psychiatric: Patient has a normal mood and affect. behavior is normal. Judgment and thought content normal. MALE GENITALIA: Normal descended testes bilaterally, no hernias RECTAL: Prostate enlarged, without tenderness or bogginess, no rectal masses or hemorrhoids MSK: Full spinal AROM, tenderness to right low back musculature - no spinal tenderness, crepitus, or step offs, good strength in BLE. No joint effusion, no swelling, gait steady and smooth.  Recent Results (from the past 2160 hour(s))  POCT urinalysis dipstick     Status:  None   Collection Time: 12/18/16  9:01 AM  Result Value Ref Range   Color, UA yellow    Clarity, UA clear    Glucose, UA negative    Bilirubin, UA negative    Ketones, UA negative    Spec Grav, UA 1.015 1.010 - 1.025   Blood, UA negative    pH, UA 5.0 5.0 - 8.0   Protein, UA negative    Urobilinogen, UA 0.2 0.2 or 1.0 E.U./dL   Nitrite, UA negative    Leukocytes, UA Negative Negative     Assessment & Plan 1. Chronic prostatitis - POCT urinalysis dipstick - Urine Culture 2. Urinary hesitancy - POCT urinalysis dipstick - Urine Culture 3. Benign prostatic hyperplasia with urinary obstruction Advised patient that because he is followed by urology already and he has no concerning acute findings, coupled with recent antibiotic treatment for prostatitis, we will send urine for culture and hold on treatment unless culture dictates otherwise. Strongly encouraged him to follow up closely with urology as needed for further urologic symptoms. Red flags as below.  4. Acute non-recurrent maxillary sinusitis - amoxicillin-clavulanate (AUGMENTIN) 875-125 MG tablet; Take 1 tablet by mouth 2 (two) times daily.  Dispense:  20 tablet; Refill: 0 - Cool mist humidifier, netti pot or saline nasal spray PRN to help with congestion. - Tylenol PRN for pain  5. Acute right-sided low back pain without sciatica - Tylenol PRN for pain - Appears to be MSK in nature. Advised proper lifting and twisting mechanics while working, and gentle stretching, heat/ice, along with rest when able. RTC if not improving.  5. Need for Tdap vaccination - Tdap vaccine greater than or equal to 7yo IM  6. Need for influenza vaccination - Flu Vaccine QUAD 6+ mos PF IM (Fluarix Quad PF)  -Red flags and when to present for emergency care or RTC including fever >101.79F, chest pain, shortness of breath, new/worsening/un-resolving symptoms, severe back pain/extremity weakness/numbness/tingling, pain with ocular movements,  abdominal pain, rectal pain, significant increase or changes in urinary symptoms reviewed with patient at time of visit. Follow up and care instructions discussed and provided in AVS.

## 2016-12-18 NOTE — Patient Instructions (Addendum)
Please keep your appointment with [facility] We will send your urine for culture and will call you with results. If you develop fevers, chills, increased pain, worsening urinary symptoms, abdominal pain, worsening low back pain, or any other concerning symptoms, please do not hesitate to return for re-evaluation.   Sinusitis, Adult Sinusitis is soreness and inflammation of your sinuses. Sinuses are hollow spaces in the bones around your face. They are located:  Around your eyes.  In the middle of your forehead.  Behind your nose.  In your cheekbones.  Your sinuses and nasal passages are lined with a stringy fluid (mucus). Mucus normally drains out of your sinuses. When your nasal tissues get inflamed or swollen, the mucus can get trapped or blocked so air cannot flow through your sinuses. This lets bacteria, viruses, and funguses grow, and that leads to infection. Follow these instructions at home: Medicines  Take, use, or apply over-the-counter and prescription medicines only as told by your doctor. These may include nasal sprays.  If you were prescribed an antibiotic medicine, take it as told by your doctor. Do not stop taking the antibiotic even if you start to feel better. Hydrate and Humidify  Drink enough water to keep your pee (urine) clear or pale yellow.  Use a cool mist humidifier to keep the humidity level in your home above 50%.  Breathe in steam for 10-15 minutes, 3-4 times a day or as told by your doctor. You can do this in the bathroom while a hot shower is running.  Try not to spend time in cool or dry air. Rest  Rest as much as possible.  Sleep with your head raised (elevated).  Make sure to get enough sleep each night. General instructions  Put a warm, moist washcloth on your face 3-4 times a day or as told by your doctor. This will help with discomfort.  Wash your hands often with soap and water. If there is no soap and water, use hand sanitizer.  Do  not smoke. Avoid being around people who are smoking (secondhand smoke).  Keep all follow-up visits as told by your doctor. This is important. Contact a doctor if:  You have a fever.  Your symptoms get worse.  Your symptoms do not get better within 10 days. Get help right away if:  You have a very bad headache.  You cannot stop throwing up (vomiting).  You have pain or swelling around your face or eyes.  You have trouble seeing.  You feel confused.  Your neck is stiff.  You have trouble breathing. This information is not intended to replace advice given to you by your health care provider. Make sure you discuss any questions you have with your health care provider. Document Released: 07/31/2007 Document Revised: 10/08/2015 Document Reviewed: 12/07/2014 Elsevier Interactive Patient Education  [employer].

## 2016-12-19 LAB — URINE CULTURE
MICRO NUMBER: 81190658
Result:: NO GROWTH
SPECIMEN QUALITY:: ADEQUATE

## 2017-02-06 ENCOUNTER — Ambulatory Visit: Payer: BLUE CROSS/BLUE SHIELD | Admitting: Family Medicine

## 2017-02-06 ENCOUNTER — Encounter: Payer: Self-pay | Admitting: Family Medicine

## 2017-02-06 VITALS — BP 104/70 | HR 89 | Ht 70.0 in | Wt 215.2 lb

## 2017-02-06 DIAGNOSIS — R5383 Other fatigue: Secondary | ICD-10-CM

## 2017-02-06 DIAGNOSIS — J3489 Other specified disorders of nose and nasal sinuses: Secondary | ICD-10-CM

## 2017-02-06 DIAGNOSIS — R0981 Nasal congestion: Secondary | ICD-10-CM | POA: Diagnosis not present

## 2017-02-06 MED ORDER — AZELASTINE HCL 0.1 % NA SOLN: 2.0000 | Freq: Two times a day (BID) | NASAL | 0 refills | Status: DC

## 2017-02-06 MED ORDER — FEXOFENADINE HCL 180 MG PO TABS: 180.0000 mg | ORAL_TABLET | Freq: Every day | ORAL | 1 refills | Status: DC

## 2017-02-06 NOTE — Patient Instructions (Signed)
Schedule with Dr. Pryor Ochoa for further evaluation - call our office if you need a new referral.  Saline nasal rinses as needed. Clean CPAP regularly.

## 2017-02-06 NOTE — Progress Notes (Signed)
Name: Jeffery [name]   MRN: [id number]    DOB: [birth year]   Date:02/06/2017       Progress Note  Subjective  Chief Complaint  Chief Complaint  Patient presents with  . Sinus Problem    Sx has been on going for x 1 month, pt has lots of sinus pressure, sinus congestion denies fever,nausea,vomitting,   . Cough    slight non productive cough   . Fatigue    HPI  Pt presents with concern for sinus issues - 1 month of sinus congestion and pain, fatigue, mild cough.  He had a 10-day course of Augmentin at the pharmacy, so he filled this and finished it yesterday without relief of symptoms.  Has tried sudafed, saline nasal wash without relief; has used nasonex in the past and this caused his prostate to enlarge.  He has been working 80-90 hour weeks as well.  He is concerned about the fatigue because it has been ongoing this entire time and he needs to have energy to work - Normal Vitamin D, TSH, and A1C in March 2018.  He is working on getting back in with his ENT Dr. Pryor Ochoa - he had sinus surgery in 2015 and did well for a year or two, but has progressively worsened since then.  Dr. Darien Ramus office is to call him back today to schedule an appointment - pt will call our office if he needs a new referral.  Patient Active Problem List   Diagnosis Date Noted  . Family history of type 2 diabetes mellitus in mother 04/05/2016  . Urinary incontinence 03/18/2016  . 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 Dyslipidemia 03/10/2015  . 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 Hypotestosteronism 03/10/2015  . Opioid type dependence, abuse ([place]) 03/10/2015  . Adiposity 03/10/2015  . Acute upper back pain 12/23/2014  . Encounter for annual physical exam 12/23/2014  . History of nasal septoplasty 11/23/2014  . Chronic prostatitis 10/21/2014  . BPH (benign prostatic hypertrophy) 10/21/2014  . Obesity 10/21/2014  . Umbilical hernia 93/81/8299    Social History   Tobacco  Use  . Smoking status: Current Every Day Smoker    Packs/day: 1.00    Years: 20.00    Pack years: 20.00    Types: Cigarettes  . Smokeless tobacco: Never Used  . Tobacco comment: Uses E-Cigarettes  Substance Use Topics  . Alcohol use: No    Alcohol/week: 0.0 oz     Current Outpatient Medications:  .  ibuprofen (ADVIL,MOTRIN) 800 MG tablet, , Disp: , Rfl: 0 .  SUMAtriptan (IMITREX) 100 MG tablet, Take 1 tablet (100 mg total) by mouth every 2 (two) hours as needed for migraine. May repeat in 2 hours if headache persists or recurs., Disp: 10 tablet, Rfl: 0 .  tadalafil (CIALIS) 5 MG tablet, Take by mouth., Disp: , Rfl:  .  tamsulosin (FLOMAX) 0.4 MG CAPS capsule, Take by mouth., Disp: , Rfl:  .  Testosterone (ANDROGEL PUMP) 20.25 MG/ACT (1.62%) GEL, Apply 1 application topically daily., Disp: 75 g, Rfl: 0 .  carvedilol (COREG) 6.25 MG tablet, Take 6.25 mg by mouth daily., Disp: , Rfl: 1 .  oxyCODONE-acetaminophen (PERCOCET/ROXICET) 5-325 MG tablet, Take by mouth every 4 (four) hours as needed for severe pain., Disp: , Rfl:   Allergies  Allergen Reactions  . Doxycycline Other (See Comments) and Nausea And Vomiting    ROS  Constitutional: Negative for fever/chills or weight change.  Respiratory: Positive for mild shortness of  breath on exertion - relieved easily with rest.  Cardiovascular: Negative for chest pain or palpitations.  Gastrointestinal: Negative for abdominal pain, NVD, no bowel changes.  Musculoskeletal: Negative for gait problem or joint swelling.  Skin: Negative for rash.  Neurological: Negative for dizziness/lightheadedness. Positive for headaches No other specific complaints in a complete review of systems (except as listed in HPI above).  Objective  Vitals:   02/06/17 0805  BP: 104/70  Pulse: 89  SpO2: 99%  Weight: 215 lb 3.2 oz (97.6 kg)  Height: 5\' 10"  (1.778 m)   Body mass index is 30.88 kg/m.  Nursing Note and Vital Signs reviewed.  Physical  Exam  Constitutional: Patient appears well-developed and well-nourished.  No distress.  HEENT: head atraumatic, normocephalic, pupils equal and reactive to light, EOM's intact, TM's without erythema or bulging, no maxillary or frontal sinus tenderness, neck supple without lymphadenopathy, oropharynx mildly erythematous and moist without exudate. Turbinates boggy. Cardiovascular: Normal rate, regular rhythm, S1/S2 present.  No murmur or rub heard. No BLE edema. Pulmonary/Chest: Effort normal and breath sounds clear. No respiratory distress or retractions. Psychiatric: Patient has a normal mood and affect. behavior is normal. Judgment and thought content normal.  Recent Results (from the past 2160 hour(s))  POCT urinalysis dipstick     Status: None   Collection Time: 12/18/16  9:01 AM  Result Value Ref Range   Color, UA yellow    Clarity, UA clear    Glucose, UA negative    Bilirubin, UA negative    Ketones, UA negative    Spec Grav, UA 1.015 1.010 - 1.025   Blood, UA negative    pH, UA 5.0 5.0 - 8.0   Protein, UA negative    Urobilinogen, UA 0.2 0.2 or 1.0 E.U./dL   Nitrite, UA negative    Leukocytes, UA Negative Negative  Urine Culture     Status: None   Collection Time: 12/18/16 10:49 AM  Result Value Ref Range   MICRO NUMBER: [id number]    SPECIMEN QUALITY: ADEQUATE    Sample Source URINE    STATUS: FINAL    Result: No Growth      Assessment & Plan  1. Sinus congestion - fexofenadine (ALLEGRA ALLERGY) 180 MG tablet; Take 1 tablet (180 mg total) by mouth daily.  Dispense: 30 tablet; Refill: 1 - azelastine (ASTELIN) 0.1 % nasal spray; Place 2 sprays into both nostrils 2 (two) times daily. Use in each nostril as directed  Dispense: 30 mL; Refill: 0 - Continue nasal saline washes PRN.  2. Sinus pain - Tylenol or ibuprofen PRN for pain.  3. Fatigue, unspecified type - BASIC METABOLIC PANEL WITH GFR - CBC w/Diff/Platelet - Vitamin B12   - We will check labs today, pt has  already completed a 10-day course of augmentin, so we will not provide antibiotic therapy today.  Discussed option of oral corticosteroids, but due to patient's difficulty with nasal steroid use (states caused his prostate to "swell"), we will avoid and defer to Dr. Pryor Ochoa ENT for further evaluation.  Pt to call our office if he needs new referral to Dr. Pryor Ochoa.  -Red flags and when to present for emergency care or RTC including fever >101.49F, chest pain, shortness of breath unrelieved with rest, eye pain/periorbital swelling, new/worsening/un-resolving symptoms, reviewed with patient at time of visit. Follow up and care instructions discussed and provided in AVS.

## 2017-02-07 ENCOUNTER — Telehealth: Payer: Self-pay

## 2017-02-07 DIAGNOSIS — R0981 Nasal congestion: Secondary | ICD-10-CM

## 2017-02-07 DIAGNOSIS — J3489 Other specified disorders of nose and nasal sinuses: Secondary | ICD-10-CM

## 2017-02-07 LAB — BASIC METABOLIC PANEL WITH GFR
BUN: 22 mg/dL (ref 7–25)
CO2: 27 mmol/L (ref 20–32)
CREATININE: 1.18 mg/dL (ref 0.60–1.35)
Calcium: 9.8 mg/dL (ref 8.6–10.3)
Chloride: 104 mmol/L (ref 98–110)
GFR, EST NON AFRICAN AMERICAN: 75 mL/min/{1.73_m2} (ref >=60)
GFR, Est African American: 86 mL/min/{1.73_m2} (ref >=60)
Glucose, Bld: 98 mg/dL (ref 65–139)
POTASSIUM: 4.4 mmol/L (ref 3.5–5.3)
SODIUM: 139 mmol/L (ref 135–146)

## 2017-02-07 LAB — CBC WITH DIFFERENTIAL/PLATELET
BASOS ABS: 81 {cells}/uL (ref 0–200)
Basophils Relative: 0.8 %
EOS ABS: 152 {cells}/uL (ref 15–500)
Eosinophils Relative: 1.5 %
HCT: 47.2 % (ref 38.5–50.0)
Hemoglobin: 16.1 g/dL (ref 13.2–17.1)
Lymphs Abs: 3343 cells/uL (ref 850–3900)
MCH: 30.1 pg (ref 27.0–33.0)
MCHC: 34.1 g/dL (ref 32.0–36.0)
MCV: 88.2 fL (ref 80.0–100.0)
MONOS PCT: 7.2 %
MPV: 10.4 fL (ref 7.5–12.5)
NEUTROS PCT: 57.4 %
Neutro Abs: 5797 cells/uL (ref 1500–7800)
PLATELETS: 284 10*3/uL (ref 140–400)
RBC: 5.35 10*6/uL (ref 4.20–5.80)
RDW: 13 % (ref 11.0–15.0)
TOTAL LYMPHOCYTE: 33.1 %
WBC: 10.1 10*3/uL (ref 3.8–10.8)
WBCMIX: 727 {cells}/uL (ref 200–950)

## 2017-02-07 LAB — VITAMIN B12: VITAMIN B 12: 800 pg/mL (ref 200–1100)

## 2017-02-07 NOTE — Telephone Encounter (Signed)
Referral placed. Pt previously established with Dr. Pryor Ochoa and is s/p 2016 Septoplasty w/ turbinate reduction, currently having increased sinus congestion and pain s/p 10-day Augmentin course.

## 2017-02-07 NOTE — Telephone Encounter (Signed)
-----   Message from Hubbard Hartshorn, [place] sent at 02/07/2017  7:55 AM EST ----- BMP, CBC, and B12 are all normal.  No need to additional antibiotics, pt just needs to follow up with Dr. Pryor Ochoa. If he was not able to make an appointment, please let me know so that I may place a new referral. Thanks!

## 2017-02-07 NOTE — Telephone Encounter (Signed)
I contacted this patient to review his labs and after he verified his date of birth, labs were reviewed.  Patient then [facility] so that it could speed up the process.

## 2017-02-11 DIAGNOSIS — R399 Unspecified symptoms and signs involving the genitourinary system: Secondary | ICD-10-CM | POA: Diagnosis not present

## 2017-02-24 ENCOUNTER — Encounter: Payer: Self-pay | Admitting: Emergency Medicine

## 2017-02-24 ENCOUNTER — Other Ambulatory Visit: Payer: Self-pay

## 2017-02-24 ENCOUNTER — Ambulatory Visit
Admission: EM | Admit: 2017-02-24 | Discharge: 2017-02-24 | Disposition: A | Discharge status: Home / Self Care | Payer: BLUE CROSS/BLUE SHIELD | Attending: Emergency Medicine | Admitting: Emergency Medicine

## 2017-02-24 DIAGNOSIS — S46211A Strain of muscle, fascia and tendon of other parts of biceps, right arm, initial encounter: Secondary | ICD-10-CM

## 2017-02-24 DIAGNOSIS — M25521 Pain in right elbow: Secondary | ICD-10-CM | POA: Diagnosis not present

## 2017-02-24 DIAGNOSIS — S46221A Laceration of muscle, fascia and tendon of other parts of biceps, right arm, initial encounter: Secondary | ICD-10-CM | POA: Diagnosis not present

## 2017-02-24 MED ORDER — NAPROXEN 500 MG PO TABS: 500.0000 mg | ORAL_TABLET | Freq: Two times a day (BID) | ORAL | 0 refills | Status: DC

## 2017-02-24 NOTE — ED Provider Notes (Signed)
[facility]    CSN: [id number] Arrival date & time: 02/24/17  4332     History   Chief Complaint Chief Complaint  Jeffery [name] presents with  . Arm Pain    right    HPI Jeffery Jeffery [name] is a 46 y.o. Jeffery [name].   HPI 46 year old Jeffery [name] who presents with a right dominant arm pain at Jeffery elbow with there is large bruising present.  States that on Saturday 2 days prior to this visit Jeffery Jeffery [name] was change in his daughter's tire Jeffery Jeffery [name] felt a pop in his elbow over Jeffery cubital fossil area.  Since that time Jeffery Jeffery [name] is complained of pain and a knot in his right biceps.  No shoulder discomfort no neck pain.  No pain distal to Jeffery elbow.  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       Past Medical History:  Diagnosis Date  . Dental crowns present    sides and back  . Dyslipidemia   . Enlarged prostate   . Epididymitis   . Headache    sinus  . Hypotestosteronism   . Nodular prostate with lower urinary tract symptoms   . Obesity   . Testosterone deficiency   . Umbilical hernia   . Umbilical hernia without obstruction or gangrene     Jeffery [name] Active Problem List   Diagnosis Date Noted  . Family history of type 2 diabetes mellitus in mother 04/05/2016  . Urinary incontinence 03/18/2016  . 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 Dyslipidemia 03/10/2015  . 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 Hypotestosteronism 03/10/2015  . Opioid type dependence, abuse ([place]) 03/10/2015  . Adiposity 03/10/2015  . Acute upper back pain 12/23/2014  . Encounter for annual physical exam 12/23/2014  . History of nasal septoplasty 11/23/2014  . Chronic prostatitis 10/21/2014  . BPH (benign prostatic hypertrophy) 10/21/2014  . Obesity 10/21/2014  . Umbilical hernia 95/18/8416    Past Surgical History:  Procedure Laterality Date  . HERNIA REPAIR  07/31/28   umbilical hernia  . NASAL SEPTOPLASTY W/ TURBINOPLASTY Bilateral 11/23/2014   Procedure: NASAL SEPTOPLASTY WITH TURBINATE  REDUCTION;  Surgeon: Carloyn Manner, MD;  Location: [facility];  Service: ENT;  Laterality: Bilateral;  . PROSTATE BIOPSY  2015  . ROOT CANAL    . WRIST FRACTURE SURGERY Right        Home Medications    Prior to Admission medications   Medication Sig Start Date End Date Taking? Authorizing Provider  SUMAtriptan (IMITREX) 100 MG tablet Take 1 tablet (100 mg total) by mouth every 2 (two) hours as needed for migraine. May repeat in 2 hours if headache persists or recurs. 03/10/15  Yes Ashok Norris, MD  tadalafil (CIALIS) 5 MG tablet Take by mouth.   Yes [provider]  tamsulosin (FLOMAX) 0.4 MG CAPS capsule Take by mouth.   Yes [provider]  Testosterone (ANDROGEL PUMP) 20.25 MG/ACT (1.62%) GEL Apply 1 application topically daily. 01/23/15  Yes Sowles, Drue Stager, MD  azelastine (ASTELIN) 0.1 % nasal spray Place 2 sprays into both nostrils 2 (two) times daily. Use in each nostril as directed 02/06/17   Hubbard Hartshorn, FNP  ibuprofen (ADVIL,MOTRIN) 800 MG tablet  02/27/16   [provider]  naproxen (NAPROSYN) 500 MG tablet Take 1 tablet (500 mg total) by mouth 2 (two) times daily with a meal. 02/24/17   Lorin Picket, PA-C    Family History Family History  Problem Relation Age of Onset  .  Diabetes Mother   . Cancer Mother        Colon Cancer  . Hypertension Father   . Cancer Maternal Grandmother        Lung-  Non-Smoker    Social History Social History   Tobacco Use  . Smoking status: Current Every Day Smoker    Packs/day: 1.00    Years: 20.00    Pack years: 20.00    Types: Cigarettes  . Smokeless tobacco: Never Used  . Tobacco comment: Uses E-Cigarettes  Substance Use Topics  . Alcohol use: No    Alcohol/week: 0.0 oz  . Drug use: No     Allergies   Doxycycline   Review of Systems Review of Systems  Constitutional: Positive for activity change. Negative for chills, fatigue and fever.  Musculoskeletal: Positive for  myalgias.  All other systems reviewed and are negative.    Physical Exam Triage Vital Signs ED Triage Vitals  Enc Vitals Group     BP 02/24/17 1012 123/75     Pulse Rate 02/24/17 1012 80     Resp 02/24/17 1012 16     Temp 02/24/17 1012 98.5 F (36.9 C)     Temp Source 02/24/17 1012 Oral     SpO2 02/24/17 1012 98 %     Weight 02/24/17 1009 220 lb (99.8 kg)     Height 02/24/17 1009 5\' 11"  (1.803 m)     Head Circumference --      Peak Flow --      Pain Score 02/24/17 1009 6     Pain Loc --      Pain Edu? --      Excl. in [place]? --    No data found.  Updated Vital Signs BP 123/75 (BP Location: Left Arm)   Pulse 80   Temp 98.5 F (36.9 C) (Oral)   Resp 16   Ht 5\' 11"  (1.803 m)   Wt 220 lb (99.8 kg)   SpO2 98%   BMI 30.68 kg/m   Visual Acuity Right Eye Distance:   Left Eye Distance:   Bilateral Distance:    Right Eye Near:   Left Eye Near:    Bilateral Near:     Physical Exam  Constitutional: Jeffery Jeffery [name] is oriented to person, place, and time. Jeffery Jeffery [name] appears well-developed and well-nourished. No distress.  HENT:  Head: Normocephalic.  Eyes: Pupils are equal, round, and reactive to light.  Neck: Normal range of motion.  Musculoskeletal: Normal range of motion. Jeffery Jeffery [name] exhibits edema and tenderness.  Examination of Jeffery right upper extremity shows no findings significant of Jeffery shoulder.  Examination of Jeffery antecubital fossa is shows ecchymosis over Jeffery antecubital fossa that is thinning medially.  Jeffery [name] has an obvious deformity of Jeffery biceps muscle.  This may indicate a tendinopathy vs full rupture.  Neurovascular function is intact distally.  Jeffery Jeffery [name] does have this strength of Jeffery biceps to clinical stressing but does give way due to Jeffery pain.  Jeffery Jeffery [name] has good supination and pronation distally.  Neurological: Jeffery Jeffery [name] is alert and oriented to person, place, and time.  Skin: Skin is warm and dry. Jeffery Jeffery [name] is not diaphoretic.  Psychiatric: Jeffery Jeffery [name] has a normal mood and affect. His behavior is normal. Judgment  and thought content normal.  Nursing note and vitals reviewed.    UC Treatments / Results  Labs (all labs ordered are listed, but only abnormal results are displayed) Labs Reviewed - No data to display  EKG  EKG Interpretation None  Radiology No results found.  Procedures Procedures (including critical care time)  Medications Ordered in UC Medications - No data to display   Initial Impression / Assessment and Plan / UC Course  I have reviewed Jeffery triage vital signs and Jeffery nursing notes.  Pertinent labs & imaging results that were available during my care of Jeffery Jeffery [name] were reviewed by me and considered in my medical decision making (see chart for details).     Plan: 1. Test/x-ray results and diagnosis reviewed with Jeffery [name] 2. rx as per orders; risks, benefits, potential side effects reviewed with Jeffery [name] 3. Recommend supportive treatment with rest and use of ice 20 minutes out of every 2 hours 4-5 times daily.  We will place him on Naprosyn for pain and inflammation.  Jeffery Jeffery [name] needs to schedule appointment with [facility] his choice as soon as possible for follow-up and evaluation.  In Jeffery meantime I have warned him not to use Jeffery right arm under stress.  I have provided him a sling for comfort and also help protect Jeffery elbow.  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 4. F/u prn if symptoms worsen or don't improve   Final Clinical Impressions(s) / UC Diagnoses   Final diagnoses:  Rupture of right biceps tendon, initial encounter    ED Discharge Orders        Ordered    naproxen (NAPROSYN) 500 MG tablet  2 times daily with meals     02/24/17 1240       Controlled Substance Prescriptions [place] Controlled Substance Registry consulted? Not Applicable   Lorin Picket, PA-C 02/24/17 1253

## 2017-02-24 NOTE — ED Triage Notes (Signed)
Patient states that on Saturday he was changing his daughter's tire and felt a pop in his right biceps.  Patient c/o pain and a knot in his right bicep.

## 2017-02-24 NOTE — Discharge Instructions (Signed)
Use ice to the area 20 minutes 2 hours 4-5 times daily.  Use a sling to protect the arm and for comfort.  Follow up with [facility] for further evaluation and care.

## 2017-02-25 DIAGNOSIS — F17210 Nicotine dependence, cigarettes, uncomplicated: Secondary | ICD-10-CM | POA: Diagnosis not present

## 2017-02-25 DIAGNOSIS — M79601 Pain in right arm: Secondary | ICD-10-CM | POA: Diagnosis not present

## 2017-02-26 DIAGNOSIS — G4733 Obstructive sleep apnea (adult) (pediatric): Secondary | ICD-10-CM | POA: Diagnosis not present

## 2017-02-26 DIAGNOSIS — J301 Allergic rhinitis due to pollen: Secondary | ICD-10-CM | POA: Diagnosis not present

## 2017-03-03 DIAGNOSIS — Y33XXXA Other specified events, undetermined intent, initial encounter: Secondary | ICD-10-CM | POA: Diagnosis not present

## 2017-03-03 DIAGNOSIS — M7989 Other specified soft tissue disorders: Secondary | ICD-10-CM | POA: Diagnosis not present

## 2017-03-03 DIAGNOSIS — S46211A Strain of muscle, fascia and tendon of other parts of biceps, right arm, initial encounter: Secondary | ICD-10-CM | POA: Diagnosis not present

## 2017-03-06 DIAGNOSIS — G8918 Other acute postprocedural pain: Secondary | ICD-10-CM | POA: Diagnosis not present

## 2017-03-06 DIAGNOSIS — S46211A Strain of muscle, fascia and tendon of other parts of biceps, right arm, initial encounter: Secondary | ICD-10-CM | POA: Diagnosis not present

## 2017-03-20 DIAGNOSIS — M25621 Stiffness of right elbow, not elsewhere classified: Secondary | ICD-10-CM | POA: Diagnosis not present

## 2017-03-20 DIAGNOSIS — S46211D Strain of muscle, fascia and tendon of other parts of biceps, right arm, subsequent encounter: Secondary | ICD-10-CM | POA: Diagnosis not present

## 2017-03-20 DIAGNOSIS — M25521 Pain in right elbow: Secondary | ICD-10-CM | POA: Diagnosis not present

## 2017-03-20 DIAGNOSIS — Z4789 Encounter for other orthopedic aftercare: Secondary | ICD-10-CM | POA: Diagnosis not present

## 2017-03-20 HISTORY — DX: Strain of muscle, fascia and tendon of other parts of biceps, right arm, subsequent encounter: S46.211D

## 2017-04-01 DIAGNOSIS — M25521 Pain in right elbow: Secondary | ICD-10-CM | POA: Diagnosis not present

## 2017-04-01 DIAGNOSIS — M25621 Stiffness of right elbow, not elsewhere classified: Secondary | ICD-10-CM | POA: Diagnosis not present

## 2017-04-01 DIAGNOSIS — Z4789 Encounter for other orthopedic aftercare: Secondary | ICD-10-CM | POA: Diagnosis not present

## 2017-04-28 DIAGNOSIS — M6281 Muscle weakness (generalized): Secondary | ICD-10-CM | POA: Diagnosis not present

## 2017-04-28 DIAGNOSIS — M25621 Stiffness of right elbow, not elsewhere classified: Secondary | ICD-10-CM | POA: Diagnosis not present

## 2017-04-28 DIAGNOSIS — Z4789 Encounter for other orthopedic aftercare: Secondary | ICD-10-CM | POA: Diagnosis not present

## 2017-04-28 DIAGNOSIS — M25521 Pain in right elbow: Secondary | ICD-10-CM | POA: Diagnosis not present

## 2017-05-07 DIAGNOSIS — M25521 Pain in right elbow: Secondary | ICD-10-CM | POA: Diagnosis not present

## 2017-05-07 DIAGNOSIS — Z4789 Encounter for other orthopedic aftercare: Secondary | ICD-10-CM | POA: Diagnosis not present

## 2017-05-07 DIAGNOSIS — M6281 Muscle weakness (generalized): Secondary | ICD-10-CM | POA: Diagnosis not present

## 2017-05-07 DIAGNOSIS — M25621 Stiffness of right elbow, not elsewhere classified: Secondary | ICD-10-CM | POA: Diagnosis not present

## 2017-05-21 DIAGNOSIS — M6281 Muscle weakness (generalized): Secondary | ICD-10-CM | POA: Diagnosis not present

## 2017-05-21 DIAGNOSIS — M25621 Stiffness of right elbow, not elsewhere classified: Secondary | ICD-10-CM | POA: Diagnosis not present

## 2017-05-21 DIAGNOSIS — M25521 Pain in right elbow: Secondary | ICD-10-CM | POA: Diagnosis not present

## 2017-05-21 DIAGNOSIS — Z4789 Encounter for other orthopedic aftercare: Secondary | ICD-10-CM | POA: Diagnosis not present

## 2017-07-02 ENCOUNTER — Ambulatory Visit: Payer: BLUE CROSS/BLUE SHIELD | Admitting: Nurse Practitioner

## 2017-07-02 ENCOUNTER — Encounter: Payer: Self-pay | Admitting: Nurse Practitioner

## 2017-07-02 ENCOUNTER — Encounter

## 2017-07-02 VITALS — BP 130/80 | HR 78 | Temp 98.2°F | Resp 18 | Ht 70.0 in | Wt 221.4 lb

## 2017-07-02 DIAGNOSIS — J020 Streptococcal pharyngitis: Secondary | ICD-10-CM

## 2017-07-02 LAB — POCT RAPID STREP A (OFFICE): Rapid Strep A Screen: NEGATIVE

## 2017-07-02 MED ORDER — MAGIC MOUTHWASH W/LIDOCAINE: 5.0000 mL | Freq: Three times a day (TID) | ORAL | 1 refills | Status: DC

## 2017-07-02 MED ORDER — AMOXICILLIN 500 MG PO CAPS: 500.0000 mg | ORAL_CAPSULE | Freq: Two times a day (BID) | ORAL | 0 refills | Status: DC

## 2017-07-02 NOTE — Progress Notes (Signed)
Name: Jeffery [name]   MRN: [id number]    DOB: 1971-02-08   Date:07/02/2017       Progress Note  Subjective  Chief Complaint  Chief Complaint  Patient presents with  . Sinusitis    scratchy throat, cough, congested, greenish drainage for 2 weeks    HPI  Sorethroat Patient states about 2 weeks ago noted sore throat localized to right side, and right facial pain and swollen nodes in neck and behind ear. Yellow-green discharge with foul taste in mouth. Has nasal congestion, bilateral facial pressure. Has been using zyrtec, and nasal spray, ibprofen, and numbing throat spray. + headaches, mild wheezing, pain with swallowing, subjective fevers- taking tylenol and ibuprofen  Denies cough, shob, cp.   Smokes 1-1.5ppd, around a lot of dust and debris at work  Patient Active Problem List   Diagnosis Date Noted  . Family history of type 2 diabetes mellitus in mother 04/05/2016  . Urinary incontinence 03/18/2016  . 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 Dyslipidemia 03/10/2015  . 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 Hypotestosteronism 03/10/2015  . Opioid type dependence, abuse ([place]) 03/10/2015  . Adiposity 03/10/2015  . Acute upper back pain 12/23/2014  . Encounter for annual physical exam 12/23/2014  . History of nasal septoplasty 11/23/2014  . Chronic prostatitis 10/21/2014  . BPH (benign prostatic hypertrophy) 10/21/2014  . Obesity 10/21/2014  . Umbilical hernia 94/76/5465    Past Medical History:  Diagnosis Date  . Dental crowns present    sides and back  . Dyslipidemia   . Enlarged prostate   . Epididymitis   . Headache    sinus  . Hypotestosteronism   . Nodular prostate with lower urinary tract symptoms   . Obesity   . Testosterone deficiency   . Umbilical hernia   . Umbilical hernia without obstruction or gangrene     Past Surgical History:  Procedure Laterality Date  . HERNIA REPAIR  0/35/46   umbilical hernia  . NASAL  SEPTOPLASTY W/ TURBINOPLASTY Bilateral 11/23/2014   Procedure: NASAL SEPTOPLASTY WITH TURBINATE REDUCTION;  Surgeon: Carloyn Manner, MD;  Location: [facility];  Service: ENT;  Laterality: Bilateral;  . PROSTATE BIOPSY  2015  . ROOT CANAL    . WRIST FRACTURE SURGERY Right     Social History   Tobacco Use  . Smoking status: Current Every Day Smoker    Packs/day: 1.00    Years: 20.00    Pack years: 20.00    Types: Cigarettes  . Smokeless tobacco: Never Used  . Tobacco comment: Uses E-Cigarettes  Substance Use Topics  . Alcohol use: No    Alcohol/week: 0.0 oz     Current Outpatient Medications:  .  ibuprofen (ADVIL,MOTRIN) 800 MG tablet, , Disp: , Rfl: 0 .  tadalafil (CIALIS) 5 MG tablet, Take by mouth., Disp: , Rfl:  .  Testosterone (ANDROGEL PUMP) 20.25 MG/ACT (1.62%) GEL, Apply 1 application topically daily., Disp: 75 g, Rfl: 0 .  azelastine (ASTELIN) 0.1 % nasal spray, Place 2 sprays into both nostrils 2 (two) times daily. Use in each nostril as directed (Patient not taking: Reported on 07/02/2017), Disp: 30 mL, Rfl: 0 .  naproxen (NAPROSYN) 500 MG tablet, Take 1 tablet (500 mg total) by mouth 2 (two) times daily with a meal. (Patient not taking: Reported on 07/02/2017), Disp: 60 tablet, Rfl: 0 .  SUMAtriptan (IMITREX) 100 MG tablet, Take 1 tablet (100 mg total) by mouth every 2 (two) hours as needed  for migraine. May repeat in 2 hours if headache persists or recurs. (Patient not taking: Reported on 07/02/2017), Disp: 10 tablet, Rfl: 0 .  tamsulosin (FLOMAX) 0.4 MG CAPS capsule, Take by mouth., Disp: , Rfl:   Allergies  Allergen Reactions  . Doxycycline Other (See Comments) and Nausea And Vomiting    ROS  Constitutional: Negative for fever or weight change.  Respiratory: Negative for cough and shortness of breath.   Cardiovascular: Negative for chest pain or palpitations.  Gastrointestinal: Negative for abdominal pain, no bowel changes.  Musculoskeletal: Negative for  gait problem or joint swelling.  Skin: Negative for rash.  Neurological: Negative for dizziness Positive for headache.  No other specific complaints in a complete review of systems (except as listed in HPI above).  Objective  Vitals:   07/02/17 1130  BP: 130/80  Pulse: 78  Resp: 18  Temp: 98.2 F (36.8 C)  TempSrc: Oral  SpO2: 97%  Weight: 221 lb 6.4 oz (100.4 kg)  Height: 5\' 10"  (1.778 m)    Body mass index is 31.77 kg/m.  Nursing Note and Vital Signs reviewed.  Physical Exam  Constitutional: Patient appears well-developed and well-nourished. No distress.  HEENT: head atraumatic, normocephalic, pupils equal and reactive to light, TM's without erythema or bulging, dried blood in left ear canal- advised not to use cotton swabs in ear, no maxillary or frontal sinus tenderness , neck supple with cervical lymphadenopathy more pronunced in left side, oropharynx red, uvula midline, difficult to visualize tonsils noted redness more pronounced on left throat- no exudate appreciated- limited view. no nasal discharge Cardiovascular: Normal rate, regular rhythm, S1/S2 present.  No murmur or rub heard.  Pulmonary/Chest: Effort normal and breath sounds clear. No respiratory distress or retractions. Psychiatric: Patient has a normal mood and affect. behavior is normal. Judgment and thought content normal.  No results found for this or any previous visit (from the past 72 hour(s)).  Assessment & Plan  1. Strep throat + exudate stated, - cough, tender cervical adenopathy, + subjective fevers, treating with ibuprofen & tylenol - continue OTC management  - magic mouthwash w/lidocaine SOLN; Take 5 mLs by mouth 3 (three) times daily.  Dispense: 50 mL; Refill: 1 - POCT rapid strep A - Culture, Group A Strep - amoxicillin (AMOXIL) 500 MG capsule; Take 1 capsule (500 mg total) by mouth 2 (two) times daily.  Dispense: 20 capsule; Refill: 0    -Red flags and when to present for emergency care  or RTC including fever >101.24F, chest pain, shortness of breath, new/worsening/un-resolving symptoms, drooling,  reviewed with patient at time of visit. Follow up and care instructions discussed and provided in AVS.

## 2017-07-02 NOTE — Patient Instructions (Addendum)
- continue the medications you are taking over the counter except the numbing spray, you can use the magic mouthwash instead - Take the antibiotic as prescribed every 12 hours for ten days with food.  - If you are having nay trouble breathing, worsening trouble swallowing, drooling please get emergency care  - If you are not improving within the next three days let us know we may need to adjust treatment or send you to ENT.   Strep Throat Strep throat is a bacterial infection of the throat. Your health care provider may call the infection tonsillitis or pharyngitis, depending on whether there is swelling in the tonsils or at the back of the throat. Strep throat is most common during the cold months of the year in children who are 65-60 years of age, but it can happen during any season in people of any age. This infection is spread from person to person (contagious) through coughing, sneezing, or close contact. What are the causes? Strep throat is caused by the bacteria called Streptococcus pyogenes. What increases the risk? This condition is more likely to develop in:  People who spend time in crowded places where the infection can spread easily.  People who have close contact with someone who has strep throat.  What are the signs or symptoms? Symptoms of this condition include:  Fever or chills.  Redness, swelling, or pain in the tonsils or throat.  Pain or difficulty when swallowing.  White or yellow spots on the tonsils or throat.  Swollen, tender glands in the neck or under the jaw.  Red rash all over the body (rare).  How is this diagnosed? This condition is diagnosed by performing a rapid strep test or by taking a swab of your throat (throat culture test). Results from a rapid strep test are usually ready in a few minutes, but throat culture test results are available after one or two days. How is this treated? This condition is treated with antibiotic medicine. Follow these  instructions at home: Medicines  Take over-the-counter and prescription medicines only as told by your health care provider.  Take your antibiotic as told by your health care provider. Do not stop taking the antibiotic even if you start to feel better.  Have family members who also have a sore throat or fever tested for strep throat. They may need antibiotics if they have the strep infection. Eating and drinking  Do not share food, drinking cups, or personal items that could cause the infection to spread to other people.  If swallowing is difficult, try eating soft foods until your sore throat feels better.  Drink enough fluid to keep your urine clear or pale yellow. General instructions  Gargle with a salt-water mixture 3-4 times per day or as needed. To make a salt-water mixture, completely dissolve -1 tsp of salt in 1 cup of warm water.  Make sure that all household members wash their hands well.  Get plenty of rest.  Stay home from school or work until you have been taking antibiotics for 24 hours.  Keep all follow-up visits as told by your health care provider. This is important. Contact a health care provider if:  The glands in your neck continue to get bigger.  You develop a rash, cough, or earache.  You cough up a thick liquid that is green, yellow-brown, or bloody.  You have pain or discomfort that does not get better with medicine.  Your problems seem to be getting worse rather than  better.  You have a fever. Get help right away if:  You have new symptoms, such as vomiting, severe headache, stiff or painful neck, chest pain, or shortness of breath.  You have severe throat pain, drooling, or changes in your voice.  You have swelling of the neck, or the skin on the neck becomes red and tender.  You have signs of dehydration, such as fatigue, dry mouth, and decreased urination.  You become increasingly sleepy, or you cannot wake up completely.  Your joints  become red or painful. This information is not intended to replace advice given to you by your health care provider. Make sure you discuss any questions you have with your health care provider. Document Released: 02/09/2000 Document Revised: 10/11/2015 Document Reviewed: 06/06/2014 Elsevier Interactive Patient Education  [employer].

## 2017-07-05 LAB — CULTURE, UPPER RESPIRATORY
MICRO NUMBER:: 90563746
SPECIMEN QUALITY:: ADEQUATE

## 2017-07-15 DIAGNOSIS — E291 Testicular hypofunction: Secondary | ICD-10-CM | POA: Diagnosis not present

## 2017-07-29 ENCOUNTER — Other Ambulatory Visit: Payer: Self-pay | Admitting: Nurse Practitioner

## 2017-07-29 DIAGNOSIS — J020 Streptococcal pharyngitis: Secondary | ICD-10-CM

## 2017-07-30 ENCOUNTER — Other Ambulatory Visit: Payer: Self-pay | Admitting: Nurse Practitioner

## 2017-07-30 DIAGNOSIS — R399 Unspecified symptoms and signs involving the genitourinary system: Secondary | ICD-10-CM | POA: Diagnosis not present

## 2017-07-30 DIAGNOSIS — J020 Streptococcal pharyngitis: Secondary | ICD-10-CM

## 2017-08-07 DIAGNOSIS — G4733 Obstructive sleep apnea (adult) (pediatric): Secondary | ICD-10-CM | POA: Diagnosis not present

## 2017-08-07 DIAGNOSIS — M25561 Pain in right knee: Secondary | ICD-10-CM | POA: Diagnosis not present

## 2017-08-07 DIAGNOSIS — M1711 Unilateral primary osteoarthritis, right knee: Secondary | ICD-10-CM | POA: Diagnosis not present

## 2017-08-07 DIAGNOSIS — Q741 Congenital malformation of knee: Secondary | ICD-10-CM | POA: Diagnosis not present

## 2017-09-02 ENCOUNTER — Encounter: Payer: Self-pay | Admitting: Family Medicine

## 2017-09-02 ENCOUNTER — Ambulatory Visit: Payer: BLUE CROSS/BLUE SHIELD | Admitting: Family Medicine

## 2017-09-02 ENCOUNTER — Other Ambulatory Visit: Payer: Self-pay | Admitting: Nurse Practitioner

## 2017-09-02 VITALS — BP 118/70 | HR 100 | Temp 98.3°F | Resp 18 | Ht 70.0 in | Wt 210.6 lb

## 2017-09-02 DIAGNOSIS — J0111 Acute recurrent frontal sinusitis: Secondary | ICD-10-CM | POA: Diagnosis not present

## 2017-09-02 DIAGNOSIS — J020 Streptococcal pharyngitis: Secondary | ICD-10-CM

## 2017-09-02 DIAGNOSIS — G43009 Migraine without aura, not intractable, without status migrainosus: Secondary | ICD-10-CM

## 2017-09-02 MED ORDER — IBUPROFEN 600 MG PO TABS: 600.0000 mg | ORAL_TABLET | Freq: Three times a day (TID) | ORAL | 1 refills | Status: DC | PRN

## 2017-09-02 MED ORDER — AMOXICILLIN-POT CLAVULANATE 875-125 MG PO TABS: 1.0000 | ORAL_TABLET | Freq: Two times a day (BID) | ORAL | 0 refills | Status: AC

## 2017-09-02 MED ORDER — SUMATRIPTAN SUCCINATE 100 MG PO TABS: 100.0000 mg | ORAL_TABLET | ORAL | 0 refills | Status: DC | PRN

## 2017-09-02 NOTE — Progress Notes (Signed)
Name: Jeffery [name]   MRN: [id number]    DOB: [birth year]   Date:09/02/2017       Progress Note  Subjective  Chief Complaint  Chief Complaint  Patient presents with  . Sinusitis    headache, facial pressure , fatigue for 1 week    HPI  Pt presents with concern for recurrent sinusitis.  This current episode has been ongoing for over 7 days now.  He reports frontal/sinus headache, extremely fatigued, sinus congestion.  Denies fevers/chills, vision changes, sore throat, ear congestion/pain.  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  Has history of septoplasty and turbinate removal/reduction in 2015 - still seeing Dr. Pryor Ochoa periodically for monitoring and was recommended he have allergy testing at his March 2019 visit, but has not had this done yet.  Still using CPAP at night without issue - cleaning this regularly. Last Antibiotic was Amoxicillin in May 2019.   Migraines: He was prescribed imitrex in the past but has not had it refilled in about 2 years - requests refill today as he is out of the medication and uses it sparingling for migraines. His pain is typically on the LEFT anterior head; denies aura, photosensitivity, phonosensitivity, nausea, or vomiting.  He does have a current headache with his sinusitis, and has been taking Ibuprofen for this without relief of medication.  Patient Active Problem List   Diagnosis Date Noted  . Family history of type 2 diabetes mellitus in mother 04/05/2016  . Urinary incontinence 03/18/2016  . 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 Dyslipidemia 03/10/2015  . 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 Hypotestosteronism 03/10/2015  . Opioid type dependence, abuse ([place]) 03/10/2015  . Adiposity 03/10/2015  . Acute upper back pain 12/23/2014  . Encounter for annual physical exam 12/23/2014  . History of nasal septoplasty 11/23/2014  . Chronic prostatitis 10/21/2014  . BPH (benign prostatic hypertrophy)  10/21/2014  . Obesity 10/21/2014  . Umbilical hernia 86/57/8469    Social History   Tobacco Use  . Smoking status: Current Every Day Smoker    Packs/day: 1.00    Years: 20.00    Pack years: 20.00    Types: Cigarettes  . Smokeless tobacco: Never Used  . Tobacco comment: Uses E-Cigarettes  Substance Use Topics  . Alcohol use: No    Alcohol/week: 0.0 oz     Current Outpatient Medications:  .  ibuprofen (ADVIL,MOTRIN) 800 MG tablet, , Disp: , Rfl: 0 .  tadalafil (CIALIS) 5 MG tablet, Take by mouth., Disp: , Rfl:  .  tamsulosin (FLOMAX) 0.4 MG CAPS capsule, Take by mouth., Disp: , Rfl:  .  Testosterone (ANDROGEL PUMP) 20.25 MG/ACT (1.62%) GEL, Apply 1 application topically daily., Disp: 75 g, Rfl: 0 .  amoxicillin (AMOXIL) 500 MG capsule, Take 1 capsule (500 mg total) by mouth 2 (two) times daily. (Patient not taking: Reported on 09/02/2017), Disp: 20 capsule, Rfl: 0 .  azelastine (ASTELIN) 0.1 % nasal spray, Place 2 sprays into both nostrils 2 (two) times daily. Use in each nostril as directed (Patient not taking: Reported on 07/02/2017), Disp: 30 mL, Rfl: 0 .  magic mouthwash w/lidocaine SOLN, Take 5 mLs by mouth 3 (three) times daily. (Patient not taking: Reported on 09/02/2017), Disp: 50 mL, Rfl: 1 .  naproxen (NAPROSYN) 500 MG tablet, Take 1 tablet (500 mg total) by mouth 2 (two) times daily with a meal. (Patient not taking: Reported on 07/02/2017), Disp: 60 tablet, Rfl: 0 .  SUMAtriptan (  IMITREX) 100 MG tablet, Take 1 tablet (100 mg total) by mouth every 2 (two) hours as needed for migraine. May repeat in 2 hours if headache persists or recurs. (Patient not taking: Reported on 07/02/2017), Disp: 10 tablet, Rfl: 0  Allergies  Allergen Reactions  . Doxycycline Other (See Comments) and Nausea And Vomiting    ROS  Ten systems reviewed and is negative except as mentioned in HPI  Objective  Vitals:   09/02/17 1424  BP: 118/70  Pulse: 100  Resp: 18  Temp: 98.3 F (36.8 C)  TempSrc:  Oral  SpO2: 94%  Weight: 210 lb 9.6 oz (95.5 kg)  Height: 5\' 10"  (1.778 m)   Body mass index is 30.22 kg/m.  Nursing Note and Vital Signs reviewed.  Physical Exam  Constitutional: Patient appears well-developed and well-nourished.  No distress.  HEENT: head atraumatic, normocephalic, pupils equal and reactive to light, EOM's intact, TM's without erythema or bulging, positive for frontal sinus tenderness on palpation, neck supple without lymphadenopathy, oropharynx pink and moist without exudate Cardiovascular: Normal rate, regular rhythm, S1/S2 present.  No murmur or rub heard. No BLE edema. Pulmonary/Chest: Effort normal and breath sounds clear. No respiratory distress or retractions. Psychiatric: Patient has a normal mood and affect. behavior is normal. Judgment and thought content normal.  No results found for this or any previous visit (from the past 72 hour(s)).  Assessment & Plan  1. Acute recurrent frontal sinusitis - amoxicillin-clavulanate (AUGMENTIN) 875-125 MG tablet; Take 1 tablet by mouth 2 (two) times daily for 10 days.  Dispense: 20 tablet; Refill: 0 - Recommend he perform follow up allergy testing with Dr.Vaught's office.  2. Migraine without aura and without status migrainosus, not intractable - SUMAtriptan (IMITREX) 100 MG tablet; Take 1 tablet (100 mg total) by mouth every 2 (two) hours as needed for migraine. May repeat in 2 hours if headache persists or recurs. No more than 2 doses in 24 hours  Dispense: 10 tablet; Refill: 0 - ibuprofen (ADVIL,MOTRIN) 600 MG tablet; Take 1 tablet (600 mg total) by mouth every 8 (eight) hours as needed.  Dispense: 30 tablet; Refill: 1  -Red flags and when to present for emergency care or RTC including fever >101.26F, chest pain, shortness of breath, new/worsening/un-resolving symptoms, vision changes, reviewed with patient at time of visit. Follow up and care instructions discussed and provided in AVS. -Reviewed Health Maintenance:  Physical to be scheduled.

## 2017-09-30 ENCOUNTER — Encounter: Payer: BLUE CROSS/BLUE SHIELD | Admitting: Family Medicine

## 2017-10-10 ENCOUNTER — Encounter: Payer: BLUE CROSS/BLUE SHIELD | Admitting: Family Medicine

## 2017-10-10 ENCOUNTER — Telehealth: Payer: Self-pay | Admitting: Family Medicine

## 2017-10-10 ENCOUNTER — Encounter: Payer: Self-pay | Admitting: Family Medicine

## 2017-10-10 ENCOUNTER — Ambulatory Visit (INDEPENDENT_AMBULATORY_CARE_PROVIDER_SITE_OTHER): Payer: BLUE CROSS/BLUE SHIELD | Admitting: Family Medicine

## 2017-10-10 VITALS — BP 118/72 | HR 91 | Temp 98.0°F | Resp 18 | Ht 70.0 in | Wt 208.1 lb

## 2017-10-10 DIAGNOSIS — Z23 Encounter for immunization: Secondary | ICD-10-CM | POA: Diagnosis not present

## 2017-10-10 DIAGNOSIS — G43009 Migraine without aura, not intractable, without status migrainosus: Secondary | ICD-10-CM

## 2017-10-10 DIAGNOSIS — E785 Hyperlipidemia, unspecified: Secondary | ICD-10-CM

## 2017-10-10 DIAGNOSIS — Z716 Tobacco abuse counseling: Secondary | ICD-10-CM | POA: Diagnosis not present

## 2017-10-10 DIAGNOSIS — E663 Overweight: Secondary | ICD-10-CM

## 2017-10-10 DIAGNOSIS — Z131 Encounter for screening for diabetes mellitus: Secondary | ICD-10-CM

## 2017-10-10 DIAGNOSIS — Z0000 Encounter for general adult medical examination without abnormal findings: Secondary | ICD-10-CM

## 2017-10-10 MED ORDER — IBUPROFEN 600 MG PO TABS: 600.0000 mg | ORAL_TABLET | Freq: Three times a day (TID) | ORAL | 1 refills | Status: DC | PRN

## 2017-10-10 MED ORDER — VARENICLINE TARTRATE 0.5 MG X 11 & 1 MG X 42 PO MISC: ORAL | 0 refills | Status: DC

## 2017-10-10 NOTE — Telephone Encounter (Signed)
Please request 2018 colonoscopy results from Dr. Edd Fabian office - it was done at [facility]. Please scan into chart and update health maintenance. Thanks!

## 2017-10-10 NOTE — Telephone Encounter (Signed)
Spoke to Di=onna at Dr. Aurora Mask office at [facility] [place]. She will fax over report

## 2017-10-10 NOTE — Progress Notes (Signed)
Name: Jeffery [name]   MRN: [id number]    DOB: June 11, 1970   Date:10/10/2017       Progress Note  Subjective  Chief Complaint  Chief Complaint  Patient presents with  . Annual Exam    HPI  Patient presents for annual CPE he did drink coffee with cream and sugar, so he will return for fasting labs.  USPSTF grade A and B recommendations:  Diet: Drinking energy drinks (C4 energy drinks); eating out 5-6 times a week - usually once a week. Usually bakes meat and fish; doesn't get a lot of fresh fruits and vegetables.  Exercise: Lifting weights a couple of days a week. He also does some boxing  He does walk a lot at his job.   Depression:  Depression screen [facility] 2/9 10/10/2017 07/02/2017 05/20/2016 03/18/2016 01/16/2015  Decreased Interest 0 0 0 0 0  Down, Depressed, Hopeless 0 0 0 0 0  PHQ - 2 Score 0 0 0 0 0  Altered sleeping 0 - - - -  Tired, decreased energy 0 - - - -  Change in appetite 0 - - - -  Feeling bad or failure about yourself  0 - - - -  Trouble concentrating 0 - - - -  Moving slowly or fidgety/restless 0 - - - -  Suicidal thoughts 0 - - - -  PHQ-9 Score 0 - - - -  Difficult doing work/chores Not difficult at all - - - -   Hypertension:  BP Readings from Last 3 Encounters:  10/10/17 118/72  09/02/17 118/70  07/02/17 130/80   Obesity: Wt Readings from Last 3 Encounters:  10/10/17 208 lb 1.6 oz (94.4 kg)  09/02/17 210 lb 9.6 oz (95.5 kg)  07/02/17 221 lb 6.4 oz (100.4 kg)   BMI Readings from Last 3 Encounters:  10/10/17 29.86 kg/m  09/02/17 30.22 kg/m  07/02/17 31.77 kg/m   Lipids: We will check today Lab Results  Component Value Date   CHOL 223 (H) 05/17/2016   CHOL 204 (A) 03/10/2015   CHOL 205 (A) 07/02/2013   Lab Results  Component Value Date   HDL 33 (L) 05/17/2016   HDL 29 (A) 03/10/2015   HDL 35 07/02/2013   Lab Results  Component Value Date   LDLCALC 154 (H) 05/17/2016   LDLCALC 131 03/10/2015   LDLCALC 127 07/02/2013   Lab Results   Component Value Date   TRIG 179 (H) 05/17/2016   TRIG 219 (A) 03/10/2015   TRIG 216 (A) 07/02/2013   Lab Results  Component Value Date   CHOLHDL 6.8 (H) 05/17/2016   No results found for: LDLDIRECT Glucose: We will check today Glucose, Bld  Date Value Ref Range Status  02/06/2017 98 65 - 139 mg/dL Final    Comment:    .        Non-fasting reference interval .   05/17/2016 106 (H) 65 - 99 mg/dL Final     Office Visit from 10/10/2017 in [facility]  AUDIT-C Score  0     Married STD testing and prevention (HIV/chl/gon/syphilis): Declines - is UTD on HIV screening; has had GC/chlamydia testing with urology; declines RPR. Hep C: Not indicated  Skin cancer: No lesions of concern today Colorectal cancer: Mother had colon cancer - he had colonoscopy last year by Dr. Tiffany [name]- had polyps, needs to return in 5 years (2023) Prostate cancer: Seeing urology for BPH and chronic prostatitis. Lab Results  Component Value Date  PSA Normal 07/05/2013   Lung cancer:   Low Dose CT Chest recommended if Age 62-80 years, 30 pack-year currently smoking OR have quit w/in 15years. Patient does not qualify.   AAA:  Does not qualify The USPSTF recommends one-time screening with ultrasonography in men ages 92 to 90 years who have ever smoked ECG:  Not indicated.  Advanced Care Planning: A voluntary discussion about advance care planning including the explanation and discussion of advance directives.  Discussed health care proxy and Living will, and the patient was able to identify a health care proxy as Jeffery [name].  Patient does not have a living will at present time. If patient does have living will, I have requested they bring this to the clinic to be scanned in to their chart.  Patient Active Problem List   Diagnosis Date Noted  . Tobacco abuse counseling 10/10/2017  . Biceps tendon rupture, right, subsequent encounter 03/20/2017  . Hypogonadism male 09/19/2016  . Lower  urinary tract symptoms (LUTS) 06/14/2016  . Family history of type 2 diabetes mellitus in mother 04/05/2016  . Urinary incontinence 03/18/2016  . 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 Dyslipidemia 03/10/2015  . 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 Hypotestosteronism 03/10/2015  . Opioid type dependence, abuse ([place]) 03/10/2015  . Adiposity 03/10/2015  . Acute upper back pain 12/23/2014  . Encounter for annual physical exam 12/23/2014  . History of nasal septoplasty 11/23/2014  . Chronic prostatitis 10/21/2014  . BPH (benign prostatic hypertrophy) 10/21/2014  . Overweight (BMI 25.0-29.9) 10/21/2014  . Umbilical hernia 60/73/7106    Past Surgical History:  Procedure Laterality Date  . HERNIA REPAIR  2/69/48   umbilical hernia  . NASAL SEPTOPLASTY W/ TURBINOPLASTY Bilateral 11/23/2014   Procedure: NASAL SEPTOPLASTY WITH TURBINATE REDUCTION;  Surgeon: Jeffery Manner, MD;  Location: [place];  Service: ENT;  Laterality: Bilateral;  . PROSTATE BIOPSY  2015  . ROOT CANAL    . WRIST FRACTURE SURGERY Right     Family History  Problem Relation Age of Onset  . Diabetes Mother   . Cancer Mother        Colon Cancer  . Hypertension Father   . Cancer Maternal Grandmother        Lung-  Non-Smoker    Social History   Socioeconomic History  . Marital status: Married    Spouse name: Not on file  . Number of children: 3  . Years of education: Not on file  . Highest education level: High school graduate  Occupational History  . Occupation: Armed forces training and education officer  Social Needs  . Financial resource strain: Not hard at all  . Food insecurity:    Worry: Never true    Inability: Never true  . Transportation needs:    Medical: No    Non-medical: No  Tobacco Use  . Smoking status: Current Every Day Smoker    Packs/day: 1.00    Years: 25.00    Pack years: 25.00    Types: Cigarettes  . Smokeless tobacco: Never Used  . Tobacco comment: Uses  E-Cigarettes  Substance and Sexual Activity  . Alcohol use: No    Alcohol/week: 0.0 standard drinks  . Drug use: No  . Sexual activity: Yes    Partners: Female  Lifestyle  . Physical activity:    Days per week: 0 days    Minutes per session: 0 min  . Stress: Not at all  Relationships  . Social connections:    Talks  on phone: More than three times a week    Gets together: More than three times a week    Attends religious service: More than 4 times per year    Active member of club or organization: Yes    Attends meetings of clubs or organizations: More than 4 times per year    Relationship status: Never married  . Intimate partner violence:    Fear of current or ex partner: No    Emotionally abused: No    Physically abused: No    Forced sexual activity: No  Other Topics Concern  . Not on file  Social History Narrative  . Not on file     Current Outpatient Medications:  .  ibuprofen (ADVIL,MOTRIN) 600 MG tablet, Take 1 tablet (600 mg total) by mouth every 8 (eight) hours as needed., Disp: 30 tablet, Rfl: 1 .  SUMAtriptan (IMITREX) 100 MG tablet, Take 1 tablet (100 mg total) by mouth every 2 (two) hours as needed for migraine. May repeat in 2 hours if headache persists or recurs. No more than 2 doses in 24 hours, Disp: 10 tablet, Rfl: 0 .  tadalafil (CIALIS) 5 MG tablet, Take by mouth., Disp: , Rfl:  .  Testosterone (ANDROGEL PUMP) 20.25 MG/ACT (1.62%) GEL, Apply 1 application topically daily., Disp: 75 g, Rfl: 0 .  naproxen (NAPROSYN) 500 MG tablet, Take 1 tablet (500 mg total) by mouth 2 (two) times daily with a meal. (Patient not taking: Reported on 10/10/2017), Disp: 60 tablet, Rfl: 0 .  tamsulosin (FLOMAX) 0.4 MG CAPS capsule, Take by mouth., Disp: , Rfl:   Allergies  Allergen Reactions  . Doxycycline Other (See Comments) and Nausea And Vomiting     ROS Constitutional: Negative for fever or weight change.  Respiratory: Negative for cough and shortness of breath.    Cardiovascular: Negative for chest pain or palpitations.  Gastrointestinal: Negative for abdominal pain, no bowel changes.  Musculoskeletal: Negative for gait problem or joint swelling.  Skin: Negative for rash.  Neurological: Negative for dizziness; chronic headache.  No other specific complaints in a complete review of systems (except as listed in HPI above).   Objective  Vitals:   10/10/17 0950  BP: 118/72  Pulse: 91  Resp: 18  Temp: 98 F (36.7 C)  TempSrc: Oral  SpO2: 96%  Weight: 208 lb 1.6 oz (94.4 kg)  Height: 5\' 10"  (1.778 m)    Body mass index is 29.86 kg/m.  Physical Exam Constitutional: Patient appears well-developed and well-nourished. No distress.  HENT: Head: Normocephalic and atraumatic. Ears: B TMs ok, no erythema or effusion; Nose: Nose normal. Mouth/Throat: Oropharynx is clear and moist. No oropharyngeal exudate.  Eyes: Conjunctivae and EOM are normal. Pupils are equal, round, and reactive to light. No scleral icterus.  Neck: Normal range of motion. Neck supple. No JVD present. No thyromegaly present.  Cardiovascular: Normal rate, regular rhythm and normal heart sounds.  No murmur heard. No BLE edema. Pulmonary/Chest: Effort normal and breath sounds normal. No respiratory distress. Abdominal: Soft. Bowel sounds are normal, no distension. There is no tenderness. no masses MALE GENITALIA: Normal descended testes bilaterally, no masses palpated, no hernias, no lesions, no discharge RECTAL: Prostate normal size and consistency, no rectal masses or hemorrhoids Musculoskeletal: Normal range of motion, no joint effusions. No gross deformities Neurological: he is alert and oriented to person, place, and time. No cranial nerve deficit. Coordination, balance, strength, speech and gait are normal.  Skin: Skin is warm and dry. No  rash noted. No erythema.  Psychiatric: Patient has a normal mood and affect. behavior is normal. Judgment and thought content normal.  No  results found for this or any previous visit (from the past 2160 hour(s)).  PHQ2/9: Depression screen [facility] 2/9 10/10/2017 07/02/2017 05/20/2016 03/18/2016 01/16/2015  Decreased Interest 0 0 0 0 0  Down, Depressed, Hopeless 0 0 0 0 0  PHQ - 2 Score 0 0 0 0 0  Altered sleeping 0 - - - -  Tired, decreased energy 0 - - - -  Change in appetite 0 - - - -  Feeling bad or failure about yourself  0 - - - -  Trouble concentrating 0 - - - -  Moving slowly or fidgety/restless 0 - - - -  Suicidal thoughts 0 - - - -  PHQ-9 Score 0 - - - -  Difficult doing work/chores Not difficult at all - - - -   Fall Risk: Fall Risk  10/10/2017 07/02/2017 05/20/2016 03/18/2016 01/16/2015  Falls in the past year? No No No No No   Assessment & Plan  1. Annual physical exam -Prostate cancer screening and PSA options (with potential risks and benefits of testing vs not testing) were discussed along with recent recs/guidelines. -USPSTF grade A and B recommendations reviewed with patient; age-appropriate recommendations, preventive care, screening tests, etc discussed and encouraged; healthy living encouraged; see AVS for patient education given to patient -Discussed importance of 150 minutes of physical activity weekly, eat two servings of fish weekly, eat one serving of tree nuts ( cashews, pistachios, pecans, almonds.Marland Kitchen) every other day, eat 6 servings of fruit/vegetables daily and drink plenty of water and avoid sweet beverages.   2. Overweight (BMI 25.0-29.9) - See above regarding health teaching, encouraged him to increase cardio when exercising. - Lipid panel - COMPLETE METABOLIC PANEL WITH GFR  3. Tobacco abuse counseling - varenicline (CHANTIX STARTING MONTH PAK) 0.5 MG X 11 & 1 MG X 42 tablet; Take one 0.5 mg tablet by mouth once daily for 3 days, then increase to one 0.5 mg tablet twice daily for 4 days, then increase to one 1 mg tablet twice daily.  Dispense: 53 tablet; Refill: 0  4. Needs flu shot - Flu Vaccine  QUAD 6+ mos PF IM (Fluarix Quad PF)  5. Diabetes mellitus screening - COMPLETE METABOLIC PANEL WITH GFR  6. Dyslipidemia - Lipid panel - Not on statin therapy  7. Migraine without aura and without status migrainosus, not intractable - ibuprofen (ADVIL,MOTRIN) 600 MG tablet; Take 1 tablet (600 mg total) by mouth every 8 (eight) hours as needed.  Dispense: 30 tablet; Refill: 1

## 2017-10-14 ENCOUNTER — Encounter: Payer: Self-pay | Admitting: Family Medicine

## 2017-10-21 DIAGNOSIS — G4733 Obstructive sleep apnea (adult) (pediatric): Secondary | ICD-10-CM | POA: Diagnosis not present

## 2017-10-28 IMAGING — CR DG CHEST 2V
2 series · 2 of 2 positions shown · non-contrast
Comparison: Radiograph dated 10/15/2015

CLINICAL DATA: Productive cough and chest congestion and chest
pain. Fever.

EXAM:
CHEST  2 VIEW

[chest pa]
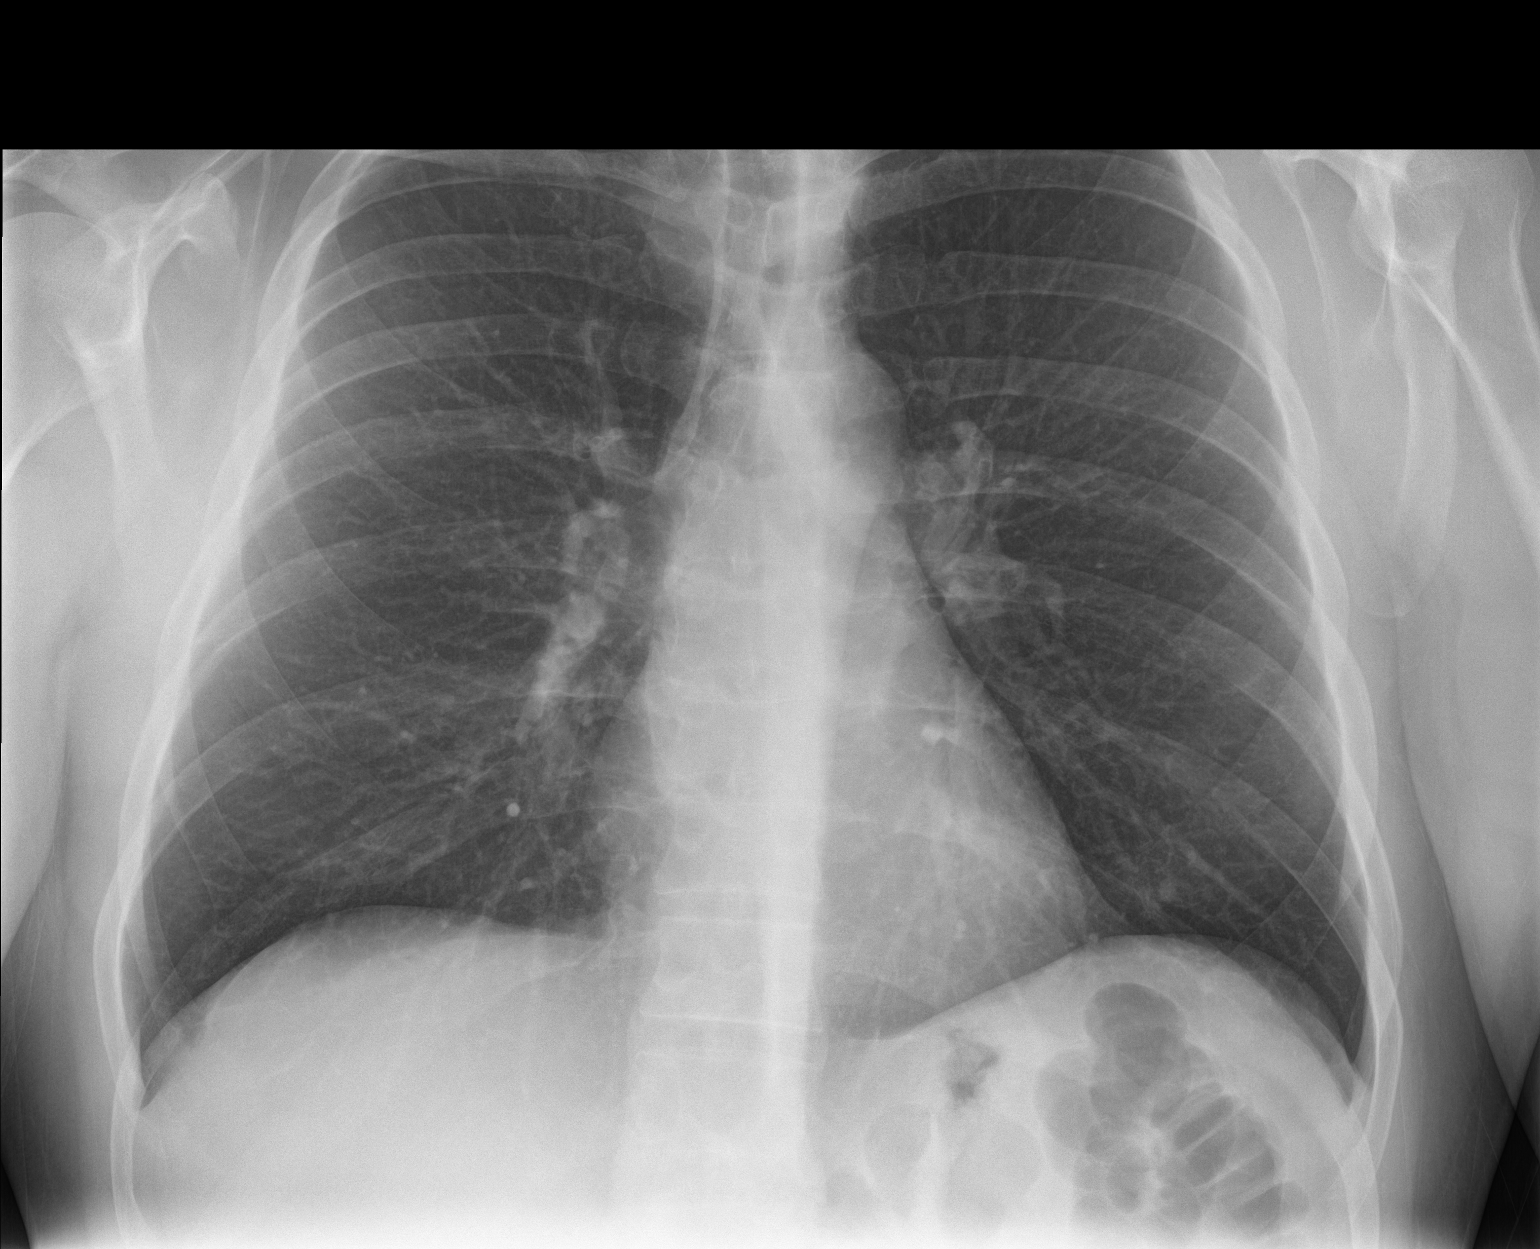

[chest lat]
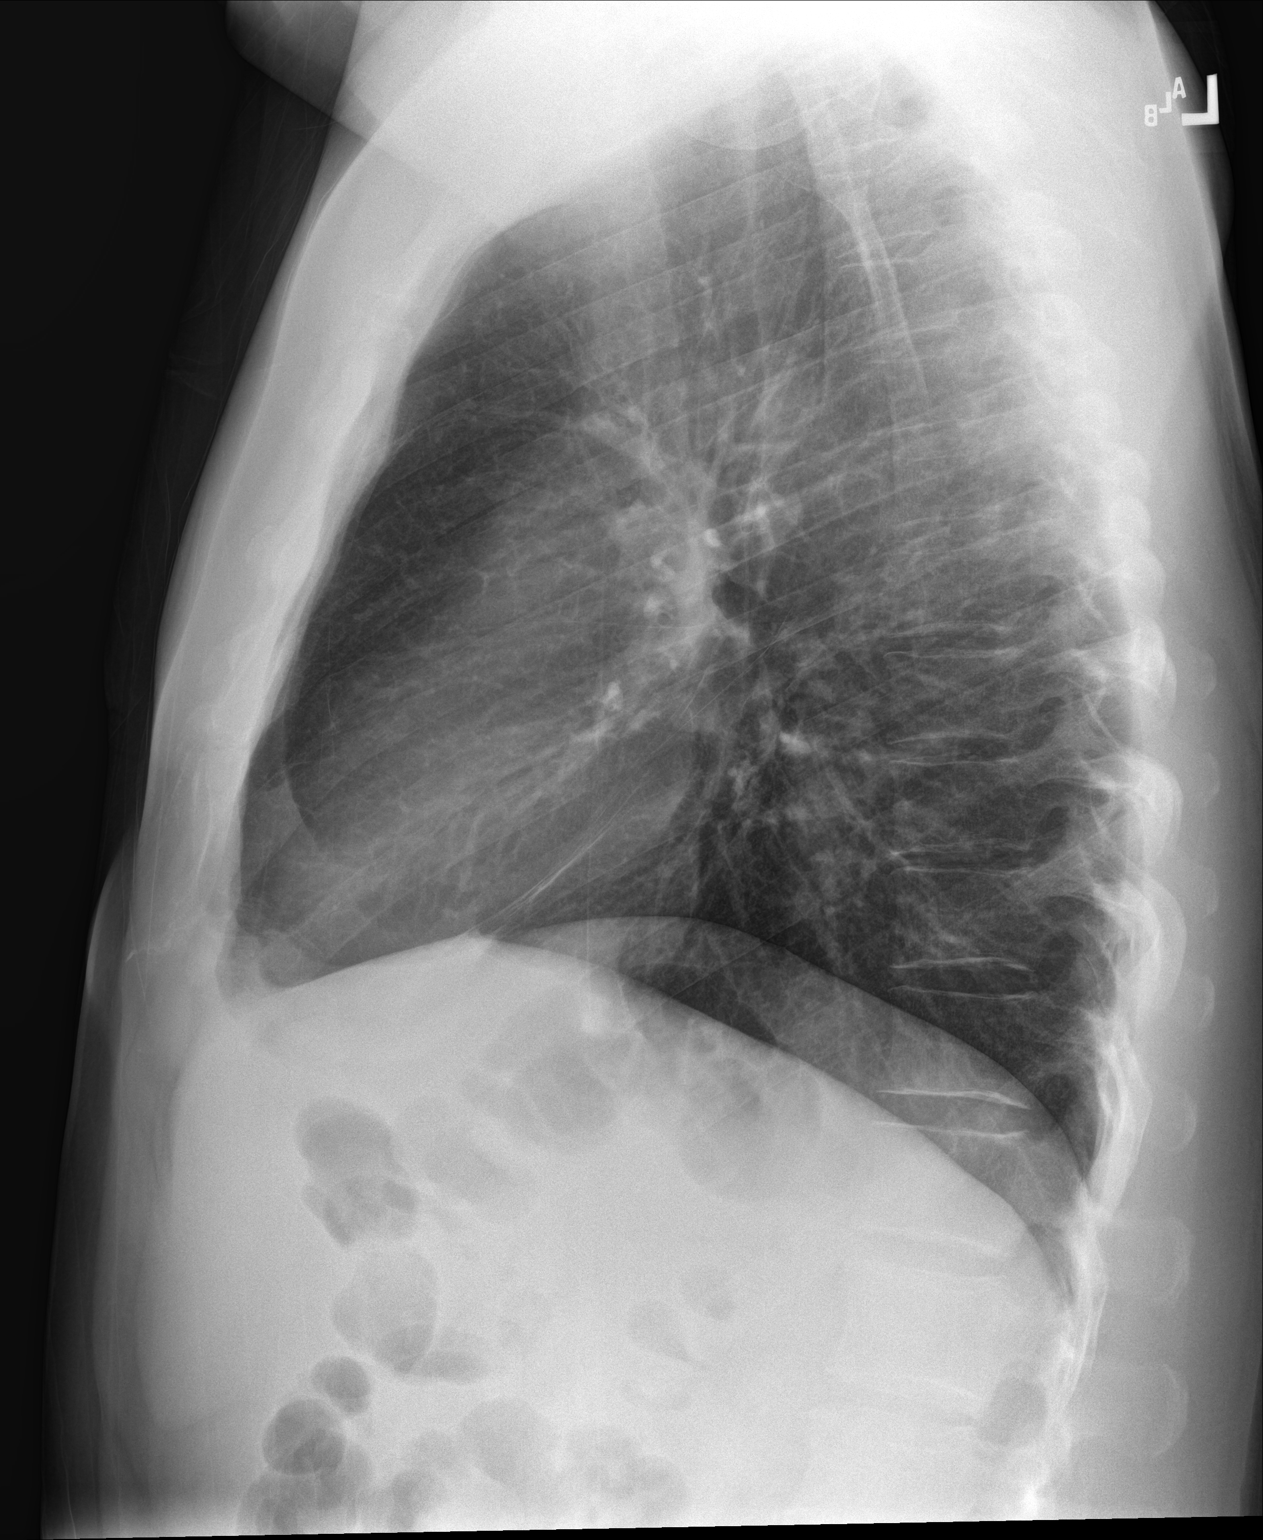

[2 of 2 positions shown; findings below may reference images not displayed]

FINDINGS: The heart size and mediastinal contours are within normal limits.
Both lungs are clear. The visualized skeletal structures are
unremarkable.
IMPRESSION: No active cardiopulmonary disease.

## 2017-12-22 ENCOUNTER — Ambulatory Visit: Payer: BLUE CROSS/BLUE SHIELD | Admitting: Family Medicine

## 2017-12-22 ENCOUNTER — Encounter: Payer: Self-pay | Admitting: Family Medicine

## 2017-12-22 VITALS — BP 146/84 | HR 83 | Temp 98.3°F | Resp 16 | Ht 70.0 in | Wt 234.7 lb

## 2017-12-22 DIAGNOSIS — I10 Essential (primary) hypertension: Secondary | ICD-10-CM

## 2017-12-22 DIAGNOSIS — R519 Headache, unspecified: Secondary | ICD-10-CM

## 2017-12-22 DIAGNOSIS — Z131 Encounter for screening for diabetes mellitus: Secondary | ICD-10-CM | POA: Diagnosis not present

## 2017-12-22 DIAGNOSIS — R51 Headache: Secondary | ICD-10-CM | POA: Diagnosis not present

## 2017-12-22 DIAGNOSIS — E785 Hyperlipidemia, unspecified: Secondary | ICD-10-CM | POA: Diagnosis not present

## 2017-12-22 LAB — COMPLETE METABOLIC PANEL WITH GFR
AG RATIO: 1.6 (calc) (ref 1.0–2.5)
ALT: 14 U/L (ref 9–46)
AST: 23 U/L (ref 10–40)
Albumin: 4.3 g/dL (ref 3.6–5.1)
Alkaline phosphatase (APISO): 71 U/L (ref 40–115)
BUN: 23 mg/dL (ref 7–25)
CALCIUM: 9.6 mg/dL (ref 8.6–10.3)
CHLORIDE: 105 mmol/L (ref 98–110)
CO2: 29 mmol/L (ref 20–32)
Creat: 1.14 mg/dL (ref 0.60–1.35)
GFR, EST AFRICAN AMERICAN: 90 mL/min/{1.73_m2} (ref >=60)
GFR, EST NON AFRICAN AMERICAN: 78 mL/min/{1.73_m2} (ref >=60)
Globulin: 2.7 g/dL (calc) (ref 1.9–3.7)
Glucose, Bld: 91 mg/dL (ref 65–99)
POTASSIUM: 4.5 mmol/L (ref 3.5–5.3)
Sodium: 139 mmol/L (ref 135–146)
TOTAL PROTEIN: 7 g/dL (ref 6.1–8.1)
Total Bilirubin: 0.3 mg/dL (ref 0.2–1.2)

## 2017-12-22 LAB — LIPID PANEL
Cholesterol: 264 mg/dL — ABNORMAL HIGH (ref <200)
HDL: 39 mg/dL — ABNORMAL LOW (ref >40)
LDL Cholesterol (Calc): 169 mg/dL (calc) — ABNORMAL HIGH
NON-HDL CHOLESTEROL (CALC): 225 mg/dL — AB (ref <130)
TRIGLYCERIDES: 330 mg/dL — AB (ref <150)
Total CHOL/HDL Ratio: 6.8 (calc) — ABNORMAL HIGH (ref <5.0)

## 2017-12-22 MED ORDER — LISINOPRIL 10 MG PO TABS
10.0000 mg | ORAL_TABLET | Freq: Every day | ORAL | 1 refills | Status: DC
Start: 2017-12-22 — End: 2018-02-19

## 2017-12-22 NOTE — Progress Notes (Addendum)
Name: Jeffery [name]   MRN: [id number]    DOB: 1971/01/17   Date:12/22/2017       Progress Note  Subjective  Chief Complaint  Chief Complaint  Patient presents with  . Hypertension    headace, not feeling well all weekend    HPI  PT presents with concern for headache and elevated BP readings over the weekend.  He just finished working 75 hours in 4 days, had a company picnic on Saturday, and felt like he had a pounding headache (that is still present), has had a couple of episodes of feeling like his vision was out of focus but not blurred.  Endorses fatigue.  Denies facial droop, slurred speech, limb weakness, chest pain, shortness of breath, or palpitations. Taking BC each day for years now - discussed stopping this; also drank a lot of energy drinks.  He checked BP at [facility] several times over the weekend and BP was 150's/90's, HR was in the 90's which is high for him.   Patient Active Problem List   Diagnosis Date Noted  . Essential hypertension 12/22/2017  . Tobacco abuse counseling 10/10/2017  . Biceps tendon rupture, right, subsequent encounter 03/20/2017  . Hypogonadism male 09/19/2016  . Lower urinary tract symptoms (LUTS) 06/14/2016  . Family history of type 2 diabetes mellitus in mother 04/05/2016  . Urinary incontinence 03/18/2016  . 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 Dyslipidemia 03/10/2015  . 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 Hypotestosteronism 03/10/2015  . Opioid type dependence, abuse ([place]) 03/10/2015  . Adiposity 03/10/2015  . Acute upper back pain 12/23/2014  . Encounter for annual physical exam 12/23/2014  . History of nasal septoplasty 11/23/2014  . Chronic prostatitis 10/21/2014  . BPH (benign prostatic hypertrophy) 10/21/2014  . Overweight (BMI 25.0-29.9) 10/21/2014  . Umbilical hernia 97/35/3299    Social History   Tobacco Use  . Smoking status: Current Every Day Smoker    Packs/day: 1.00    Years:  25.00    Pack years: 25.00    Types: Cigarettes  . Smokeless tobacco: Never Used  . Tobacco comment: Uses E-Cigarettes  Substance Use Topics  . Alcohol use: No    Alcohol/week: 0.0 standard drinks     Current Outpatient Medications:  .  ibuprofen (ADVIL,MOTRIN) 600 MG tablet, Take 1 tablet (600 mg total) by mouth every 8 (eight) hours as needed., Disp: 30 tablet, Rfl: 1 .  lisinopril (PRINIVIL,ZESTRIL) 10 MG tablet, Take 1 tablet (10 mg total) by mouth daily., Disp: 30 tablet, Rfl: 1 .  naproxen (NAPROSYN) 500 MG tablet, Take 1 tablet (500 mg total) by mouth 2 (two) times daily with a meal. (Patient not taking: Reported on 10/10/2017), Disp: 60 tablet, Rfl: 0 .  SUMAtriptan (IMITREX) 100 MG tablet, Take 1 tablet (100 mg total) by mouth every 2 (two) hours as needed for migraine. May repeat in 2 hours if headache persists or recurs. No more than 2 doses in 24 hours, Disp: 10 tablet, Rfl: 0 .  tadalafil (CIALIS) 5 MG tablet, Take by mouth., Disp: , Rfl:  .  tamsulosin (FLOMAX) 0.4 MG CAPS capsule, Take by mouth., Disp: , Rfl:  .  Testosterone (ANDROGEL PUMP) 20.25 MG/ACT (1.62%) GEL, Apply 1 application topically daily., Disp: 75 g, Rfl: 0 .  varenicline (CHANTIX STARTING MONTH PAK) 0.5 MG X 11 & 1 MG X 42 tablet, Take one 0.5 mg tablet by mouth once daily for 3 days, then increase to one 0.5 mg  tablet twice daily for 4 days, then increase to one 1 mg tablet twice daily., Disp: 53 tablet, Rfl: 0  Allergies  Allergen Reactions  . Doxycycline Other (See Comments) and Nausea And Vomiting    I personally reviewed active problem list, medication list, allergies, family history, social history, health maintenance, lab results with the patient/caregiver today.  ROS  Constitutional: Negative for fever or weight change.  Respiratory: Negative for cough and shortness of breath.   Cardiovascular: Negative for chest pain or palpitations.  Gastrointestinal: Negative for abdominal pain, no bowel  changes.  Musculoskeletal: Negative for gait problem or joint swelling.  Skin: Negative for rash.  Neurological: See HPI No other specific complaints in a complete review of systems (except as listed in HPI above).  Objective  Vitals:   12/22/17 0841  BP: (!) 146/84  Pulse: 83  Resp: 16  Temp: 98.3 F (36.8 C)  TempSrc: Oral  SpO2: 99%  Weight: 234 lb 11.2 oz (106.5 kg)  Height: 5\' 10"  (1.778 m)   Body mass index is 33.68 kg/m.  Nursing Note and Vital Signs reviewed.  Physical Exam  Constitutional: Patient appears well-developed and well-nourished. No distress.  HENT: Head: Normocephalic and atraumatic.  Eyes: Conjunctivae and EOM are normal. No scleral icterus.  Pupils are equal, round, and reactive to light.  Neck: Normal range of motion. Neck supple. No JVD present. No thyromegaly present.  Cardiovascular: Normal rate, regular rhythm and normal heart sounds.  No murmur heard. No BLE edema. Pulmonary/Chest: Effort normal and breath sounds normal. No respiratory distress. Abdominal: Soft. Bowel sounds are normal, no distension. There is no tenderness. No masses. Musculoskeletal: Normal range of motion, no joint effusions. No gross deformities Neurological: Pt is alert and oriented to person, place, and time. No cranial nerve deficit. Coordination, balance, strength, speech and gait are normal.   Skin: Skin is warm and dry. No rash noted. No erythema.  Psychiatric: Patient has a normal mood and affect. behavior is normal. Judgment and thought content normal.  No results found for this or any previous visit (from the past 72 hour(s)).  Assessment & Plan  1. Essential hypertension - DASH diet discussed in detail. - lisinopril (PRINIVIL,ZESTRIL) 10 MG tablet; Take 1 tablet (10 mg total) by mouth daily.  Dispense: 30 tablet; Refill: 1 - CMP and lipid panel were drawn prior to his visit today. - Discussed stress reduction at length with the patient, as well as reducing salt  intake and caffeine intake and stopping BC powders.  He has had several readings above goal outside of our office and is symptomatic with headache and intermittent blurred vision, so we will initiate treatment today.  However I did discuss potential hypotension if he starts to manage his other risk factors and he verbalizes understanding - will call if orthostatic lightheadedness/dizziness occur.    2. Acute nonintractable headache, unspecified headache type - Advised to stop BC powders, reduce caffeine, stress management is discussed in detail.  -Red flags and when to present for emergency care or RTC including fever >101.61F, chest pain, shortness of breath, new/worsening/un-resolving symptoms, FAST stroke symptoms reviewed with patient at time of visit. Follow up and care instructions discussed and provided in AVS.

## 2017-12-22 NOTE — Patient Instructions (Addendum)
DASH Eating Plan DASH stands for "Dietary Approaches to Stop Hypertension." The DASH eating plan is a healthy eating plan that has been shown to reduce high blood pressure (hypertension). It may also reduce your risk for type 2 diabetes, heart disease, and stroke. The DASH eating plan may also help with weight loss. What are tips for following this plan? General guidelines  Avoid eating more than 2,300 mg (milligrams) of salt (sodium) a day. If you have hypertension, you may need to reduce your sodium intake to 1,500 mg a day.  Limit alcohol intake to no more than 1 drink a day for nonpregnant women and 2 drinks a day for men. One drink equals 12 oz of beer, 5 oz of wine, or 1 oz of hard liquor.  Work with your health care provider to maintain a healthy body weight or to lose weight. Ask what an ideal weight is for you.  Get at least 30 minutes of exercise that causes your heart to beat faster (aerobic exercise) most days of the week. Activities may include walking, swimming, or biking.  Work with your health care provider or diet and nutrition specialist (dietitian) to adjust your eating plan to your individual calorie needs. Reading food labels  Check food labels for the amount of sodium per serving. Choose foods with less than 5 percent of the Daily Value of sodium. Generally, foods with less than 300 mg of sodium per serving fit into this eating plan.  To find whole grains, look for the word "whole" as the first word in the ingredient list. Shopping  Buy products labeled as "low-sodium" or "no salt added."  Buy fresh foods. Avoid canned foods and premade or frozen meals. Cooking  Avoid adding salt when cooking. Use salt-free seasonings or herbs instead of table salt or sea salt. Check with your health care provider or pharmacist before using salt substitutes.  Do not fry foods. Cook foods using healthy methods such as baking, boiling, grilling, and broiling instead.  Cook with  heart-healthy oils, such as olive, canola, soybean, or sunflower oil. Meal planning   Eat a balanced diet that includes: ? 5 or more servings of fruits and vegetables each day. At each meal, try to fill half of your plate with fruits and vegetables. ? Up to 6-8 servings of whole grains each day. ? Less than 6 oz of lean meat, poultry, or fish each day. A 3-oz serving of meat is about the same size as a deck of cards. One egg equals 1 oz. ? 2 servings of low-fat dairy each day. ? A serving of nuts, seeds, or beans 5 times each week. ? Heart-healthy fats. Healthy fats called Omega-3 fatty acids are found in foods such as flaxseeds and coldwater fish, like sardines, salmon, and mackerel.  Limit how much you eat of the following: ? Canned or prepackaged foods. ? Food that is high in trans fat, such as fried foods. ? Food that is high in saturated fat, such as fatty meat. ? Sweets, desserts, sugary drinks, and other foods with added sugar. ? Full-fat dairy products.  Do not salt foods before eating.  Try to eat at least 2 vegetarian meals each week.  Eat more home-cooked food and less restaurant, buffet, and fast food.  When eating at a restaurant, ask that your food be prepared with less salt or no salt, if possible. What foods are recommended? The items listed may not be a complete list. Talk with your dietitian about what   dietary choices are best for you. Grains Whole-grain or whole-wheat bread. Whole-grain or whole-wheat pasta. Brown rice. Oatmeal. Quinoa. Bulgur. Whole-grain and low-sodium cereals. Pita bread. Low-fat, low-sodium crackers. Whole-wheat flour tortillas. Vegetables Fresh or frozen vegetables (raw, steamed, roasted, or grilled). Low-sodium or reduced-sodium tomato and vegetable juice. Low-sodium or reduced-sodium tomato sauce and tomato paste. Low-sodium or reduced-sodium canned vegetables. Fruits All fresh, dried, or frozen fruit. Canned fruit in natural juice (without  added sugar). Meat and other protein foods Skinless chicken or turkey. Ground chicken or turkey. Pork with fat trimmed off. Fish and seafood. Egg whites. Dried beans, peas, or lentils. Unsalted nuts, nut butters, and seeds. Unsalted canned beans. Lean cuts of beef with fat trimmed off. Low-sodium, lean deli meat. Dairy Low-fat (1%) or fat-free (skim) milk. Fat-free, low-fat, or reduced-fat cheeses. Nonfat, low-sodium ricotta or cottage cheese. Low-fat or nonfat yogurt. Low-fat, low-sodium cheese. Fats and oils Soft margarine without trans fats. Vegetable oil. Low-fat, reduced-fat, or light mayonnaise and salad dressings (reduced-sodium). Canola, safflower, olive, soybean, and sunflower oils. Avocado. Seasoning and other foods Herbs. Spices. Seasoning mixes without salt. Unsalted popcorn and pretzels. Fat-free sweets. What foods are not recommended? The items listed may not be a complete list. Talk with your dietitian about what dietary choices are best for you. Grains Baked goods made with fat, such as croissants, muffins, or some breads. Dry pasta or rice meal packs. Vegetables Creamed or fried vegetables. Vegetables in a cheese sauce. Regular canned vegetables (not low-sodium or reduced-sodium). Regular canned tomato sauce and paste (not low-sodium or reduced-sodium). Regular tomato and vegetable juice (not low-sodium or reduced-sodium). Pickles. Olives. Fruits Canned fruit in a light or heavy syrup. Fried fruit. Fruit in cream or butter sauce. Meat and other protein foods Fatty cuts of meat. Ribs. Fried meat. Bacon. Sausage. Bologna and other processed lunch meats. Salami. Fatback. Hotdogs. Bratwurst. Salted nuts and seeds. Canned beans with added salt. Canned or smoked fish. Whole eggs or egg yolks. Chicken or turkey with skin. Dairy Whole or 2% milk, cream, and half-and-half. Whole or full-fat cream cheese. Whole-fat or sweetened yogurt. Full-fat cheese. Nondairy creamers. Whipped toppings.  Processed cheese and cheese spreads. Fats and oils Butter. Stick margarine. Lard. Shortening. Ghee. Bacon fat. Tropical oils, such as coconut, palm kernel, or palm oil. Seasoning and other foods Salted popcorn and pretzels. Onion salt, garlic salt, seasoned salt, table salt, and sea salt. Worcestershire sauce. Tartar sauce. Barbecue sauce. Teriyaki sauce. Soy sauce, including reduced-sodium. Steak sauce. Canned and packaged gravies. Fish sauce. Oyster sauce. Cocktail sauce. Horseradish that you find on the shelf. Ketchup. Mustard. Meat flavorings and tenderizers. Bouillon cubes. Hot sauce and Tabasco sauce. Premade or packaged marinades. Premade or packaged taco seasonings. Relishes. Regular salad dressings. Where to find more information:  National Heart, Lung, and Blood Institute: www.nhlbi.nih.gov  American Heart Association: www.heart.org Summary  The DASH eating plan is a healthy eating plan that has been shown to reduce high blood pressure (hypertension). It may also reduce your risk for type 2 diabetes, heart disease, and stroke.  With the DASH eating plan, you should limit salt (sodium) intake to 2,300 mg a day. If you have hypertension, you may need to reduce your sodium intake to 1,500 mg a day.  When on the DASH eating plan, aim to eat more fresh fruits and vegetables, whole grains, lean proteins, low-fat dairy, and heart-healthy fats.  Work with your health care provider or diet and nutrition specialist (dietitian) to adjust your eating plan to your individual   calorie needs. This information is not intended to replace advice given to you by your health care provider. Make sure you discuss any questions you have with your health care provider. Document Released: 01/31/2011 Document Revised: 02/05/2016 Document Reviewed: 02/05/2016 Elsevier Interactive Patient Education  2018 Elsevier Inc.  Hypertension Hypertension is another name for high blood pressure. High blood pressure  forces your heart to work harder to pump blood. This can cause problems over time. There are two numbers in a blood pressure reading. There is a top number (systolic) over a bottom number (diastolic). It is best to have a blood pressure below 120/80. Healthy choices can help lower your blood pressure. You may need medicine to help lower your blood pressure if:  Your blood pressure cannot be lowered with healthy choices.  Your blood pressure is higher than 130/80.  Follow these instructions at home: Eating and drinking  If directed, follow the DASH eating plan. This diet includes: ? Filling half of your plate at each meal with fruits and vegetables. ? Filling one quarter of your plate at each meal with whole grains. Whole grains include whole wheat pasta, brown rice, and whole grain bread. ? Eating or drinking low-fat dairy products, such as skim milk or low-fat yogurt. ? Filling one quarter of your plate at each meal with low-fat (lean) proteins. Low-fat proteins include fish, skinless chicken, eggs, beans, and tofu. ? Avoiding fatty meat, cured and processed meat, or chicken with skin. ? Avoiding premade or processed food.  Eat less than 1,500 mg of salt (sodium) a day.  Limit alcohol use to no more than 1 drink a day for nonpregnant women and 2 drinks a day for men. One drink equals 12 oz of beer, 5 oz of wine, or 1 oz of hard liquor. Lifestyle  Work with your doctor to stay at a healthy weight or to lose weight. Ask your doctor what the best weight is for you.  Get at least 30 minutes of exercise that causes your heart to beat faster (aerobic exercise) most days of the week. This may include walking, swimming, or biking.  Get at least 30 minutes of exercise that strengthens your muscles (resistance exercise) at least 3 days a week. This may include lifting weights or pilates.  Do not use any products that contain nicotine or tobacco. This includes cigarettes and e-cigarettes. If you  need help quitting, ask your doctor.  Check your blood pressure at home as told by your doctor.  Keep all follow-up visits as told by your doctor. This is important. Medicines  Take over-the-counter and prescription medicines only as told by your doctor. Follow directions carefully.  Do not skip doses of blood pressure medicine. The medicine does not work as well if you skip doses. Skipping doses also puts you at risk for problems.  Ask your doctor about side effects or reactions to medicines that you should watch for. Contact a doctor if:  You think you are having a reaction to the medicine you are taking.  You have headaches that keep coming back (recurring).  You feel dizzy.  You have swelling in your ankles.  You have trouble with your vision. Get help right away if:  You get a very bad headache.  You start to feel confused.  You feel weak or numb.  You feel faint.  You get very bad pain in your: ? Chest. ? Belly (abdomen).  You throw up (vomit) more than once.  You have trouble breathing.   Summary  Hypertension is another name for high blood pressure.  Making healthy choices can help lower blood pressure. If your blood pressure cannot be controlled with healthy choices, you may need to take medicine. This information is not intended to replace advice given to you by your health care provider. Make sure you discuss any questions you have with your health care provider. Document Released: 07/31/2007 Document Revised: 01/10/2016 Document Reviewed: 01/10/2016 Elsevier Interactive Patient Education  2018 Elsevier Inc.  

## 2017-12-23 ENCOUNTER — Other Ambulatory Visit: Payer: Self-pay | Admitting: Family Medicine

## 2017-12-23 DIAGNOSIS — E6609 Other obesity due to excess calories: Secondary | ICD-10-CM

## 2017-12-23 DIAGNOSIS — E782 Mixed hyperlipidemia: Secondary | ICD-10-CM

## 2017-12-23 DIAGNOSIS — Z6833 Body mass index (BMI) 33.0-33.9, adult: Secondary | ICD-10-CM

## 2017-12-23 MED ORDER — ICOSAPENT ETHYL 1 G PO CAPS: 2.0000 g | ORAL_CAPSULE | Freq: Two times a day (BID) | ORAL | 1 refills | Status: DC

## 2017-12-23 MED ORDER — ATORVASTATIN CALCIUM 40 MG PO TABS: 40.0000 mg | ORAL_TABLET | Freq: Every day | ORAL | 1 refills | Status: DC

## 2018-01-05 ENCOUNTER — Ambulatory Visit: Payer: BLUE CROSS/BLUE SHIELD

## 2018-01-08 DIAGNOSIS — E291 Testicular hypofunction: Secondary | ICD-10-CM | POA: Diagnosis not present

## 2018-01-08 DIAGNOSIS — R399 Unspecified symptoms and signs involving the genitourinary system: Secondary | ICD-10-CM | POA: Diagnosis not present

## 2018-01-09 ENCOUNTER — Ambulatory Visit: Payer: BLUE CROSS/BLUE SHIELD | Admitting: Family Medicine

## 2018-01-09 ENCOUNTER — Encounter: Payer: Self-pay | Admitting: Family Medicine

## 2018-01-09 VITALS — BP 136/84 | HR 100 | Temp 98.2°F | Resp 16 | Ht 70.0 in | Wt 232.8 lb

## 2018-01-09 DIAGNOSIS — E782 Mixed hyperlipidemia: Secondary | ICD-10-CM | POA: Diagnosis not present

## 2018-01-09 DIAGNOSIS — E6609 Other obesity due to excess calories: Secondary | ICD-10-CM | POA: Diagnosis not present

## 2018-01-09 DIAGNOSIS — J0140 Acute pansinusitis, unspecified: Secondary | ICD-10-CM | POA: Diagnosis not present

## 2018-01-09 DIAGNOSIS — Z6833 Body mass index (BMI) 33.0-33.9, adult: Secondary | ICD-10-CM

## 2018-01-09 LAB — TSH: TSH: 1.02 m[IU]/L (ref 0.40–4.50)

## 2018-01-09 MED ORDER — AMOXICILLIN-POT CLAVULANATE 875-125 MG PO TABS: 1.0000 | ORAL_TABLET | Freq: Two times a day (BID) | ORAL | 0 refills | Status: AC

## 2018-01-09 MED ORDER — GUAIFENESIN ER 600 MG PO TB12: 600.0000 mg | ORAL_TABLET | Freq: Two times a day (BID) | ORAL | 0 refills | Status: DC | PRN

## 2018-01-09 MED ORDER — AMOXICILLIN-POT CLAVULANATE 875-125 MG PO TABS: 1.0000 | ORAL_TABLET | Freq: Two times a day (BID) | ORAL | 0 refills | Status: DC

## 2018-01-09 NOTE — Progress Notes (Signed)
Name: Jeffery [name]   MRN: [id number]    DOB: 1970/04/15   Date:01/09/2018       Progress Note  Subjective  Chief Complaint  Chief Complaint  Patient presents with  . Hypertension    blood pressure check  . Sinusitis    cough, congested, facial pressure for 2 weeks    HPI  Pt came in for BP check (nurse visit) and asked to be seen for sinusitis as well. He has been suffering from sinus pain/pressure for about 8-9 days.  Endorses fatigue, neck discomfort (has full AROM), and upper/lower sinus pain and pressure, intermittent cough.  Denies chest pain, shortness of breath, palpitations, ear pain/pressure, sore throat. Has labs w/ [facility] yesterday and WBC was 12.5.  Patient Active Problem List   Diagnosis Date Noted  . Essential hypertension 12/22/2017  . Tobacco abuse counseling 10/10/2017  . Biceps tendon rupture, right, subsequent encounter 03/20/2017  . Hypogonadism male 09/19/2016  . Lower urinary tract symptoms (LUTS) 06/14/2016  . Family history of type 2 diabetes mellitus in mother 04/05/2016  . Urinary incontinence 03/18/2016  . 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 Dyslipidemia 03/10/2015  . 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 Hypotestosteronism 03/10/2015  . Opioid type dependence, abuse ([place]) 03/10/2015  . Adiposity 03/10/2015  . Acute upper back pain 12/23/2014  . Encounter for annual physical exam 12/23/2014  . History of nasal septoplasty 11/23/2014  . Chronic prostatitis 10/21/2014  . BPH (benign prostatic hypertrophy) 10/21/2014  . Overweight (BMI 25.0-29.9) 10/21/2014  . Umbilical hernia 02/40/9735    Social History   Tobacco Use  . Smoking status: Current Every Day Smoker    Packs/day: 1.00    Years: 25.00    Pack years: 25.00    Types: Cigarettes  . Smokeless tobacco: Never Used  . Tobacco comment: Uses E-Cigarettes  Substance Use Topics  . Alcohol use: No    Alcohol/week: 0.0 standard drinks      Current Outpatient Medications:  .  atorvastatin (LIPITOR) 40 MG tablet, Take 1 tablet (40 mg total) by mouth daily., Disp: 90 tablet, Rfl: 1 .  ibuprofen (ADVIL,MOTRIN) 600 MG tablet, Take 1 tablet (600 mg total) by mouth every 8 (eight) hours as needed., Disp: 30 tablet, Rfl: 1 .  Icosapent Ethyl 1 g CAPS, Take 2 capsules (2 g total) by mouth 2 (two) times daily., Disp: 180 capsule, Rfl: 1 .  lisinopril (PRINIVIL,ZESTRIL) 10 MG tablet, Take 1 tablet (10 mg total) by mouth daily., Disp: 30 tablet, Rfl: 1 .  SUMAtriptan (IMITREX) 100 MG tablet, Take 1 tablet (100 mg total) by mouth every 2 (two) hours as needed for migraine. May repeat in 2 hours if headache persists or recurs. No more than 2 doses in 24 hours, Disp: 10 tablet, Rfl: 0 .  tadalafil (CIALIS) 5 MG tablet, Take by mouth., Disp: , Rfl:  .  tamsulosin (FLOMAX) 0.4 MG CAPS capsule, Take by mouth., Disp: , Rfl:  .  Testosterone (ANDROGEL PUMP) 20.25 MG/ACT (1.62%) GEL, Apply 1 application topically daily., Disp: 75 g, Rfl: 0 .  varenicline (CHANTIX STARTING MONTH PAK) 0.5 MG X 11 & 1 MG X 42 tablet, Take one 0.5 mg tablet by mouth once daily for 3 days, then increase to one 0.5 mg tablet twice daily for 4 days, then increase to one 1 mg tablet twice daily., Disp: 53 tablet, Rfl: 0 .  naproxen (NAPROSYN) 500 MG tablet, Take 1 tablet (500 mg total)  by mouth 2 (two) times daily with a meal. (Patient not taking: Reported on 10/10/2017), Disp: 60 tablet, Rfl: 0  Allergies  Allergen Reactions  . Doxycycline Other (See Comments) and Nausea And Vomiting    I personally reviewed active problem list, medication list, allergies, lab results with the patient/caregiver today.  ROS  Ten systems reviewed and is negative except as mentioned in HPI  Objective  Vitals:   01/09/18 0916  BP: 136/84  Pulse: 100  Resp: 16  Temp: 98.2 F (36.8 C)  TempSrc: Oral  SpO2: 98%  Weight: 232 lb 12.8 oz (105.6 kg)  Height: 5\' 10"  (1.778 m)    Body mass index is 33.4 kg/m.  Nursing Note and Vital Signs reviewed.  Physical Exam  Constitutional: Patient appears well-developed and well-nourished. Obese. No distress.  HEENT: head atraumatic, normocephalic, pupils equal and reactive to light, Bilateral TM's without erythema or effusion,  bilateral maxillary and frontal sinuses are tender, neck supple with mild submandibular lymphadenopathy and full AROM, throat within normal limits - no erythema or exudate, no tonsillar swelling Cardiovascular: Normal rate, regular rhythm and normal heart sounds.  No murmur heard. No BLE edema. Pulmonary/Chest: Effort normal and breath sounds clear bilaterally. No respiratory distress. Abdominal: Soft, bowel sounds normal, there is no tenderness, no HSM Psychiatric: Patient has a normal mood and affect. behavior is normal. Judgment and thought content normal.  No results found for this or any previous visit (from the past 72 hour(s)).  Assessment & Plan  1. Acute non-recurrent pansinusitis - He requests Z-pack - advised this is no longer recommended due to resistance, suggested doxycyline, but states this makes him sick.  He is agreeable to trial Augmentin as this is 1st line therapy, and willc all back in 5 days if not improving. - guaiFENesin (MUCINEX) 600 MG 12 hr tablet; Take 1 tablet (600 mg total) by mouth 2 (two) times daily as needed for cough or to loosen phlegm.  Dispense: 20 tablet; Refill: 0 - amoxicillin-clavulanate (AUGMENTIN) 875-125 MG tablet; Take 1 tablet by mouth 2 (two) times daily for 10 days.  Dispense: 20 tablet; Refill: 0  -Red flags and when to present for emergency care or RTC including fever >101.13F, chest pain, shortness of breath, new/worsening/un-resolving symptoms, pain with ocular movement, periorbital swelling reviewed with patient at time of visit. Follow up and care instructions discussed and provided in AVS.

## 2018-01-19 DIAGNOSIS — G4733 Obstructive sleep apnea (adult) (pediatric): Secondary | ICD-10-CM | POA: Diagnosis not present

## 2018-01-26 ENCOUNTER — Telehealth: Payer: Self-pay

## 2018-01-26 NOTE — Telephone Encounter (Signed)
Copied from [place] [phone]. Topic: General - Other >> Jan 26, 2018 10:18 AM Ivar Drape wrote: Reason for CRM:   Patient was in to see the provider a couple of weeks ago and was given an antibiotic.  He was told if the antibiotic didn't work to inform the provider.  The patient is still experiencing sinus pressure and pain, very congested.

## 2018-01-27 NOTE — Telephone Encounter (Signed)
He will go to ENT if that is what you suggest, but he said they only going to give him the same thing as you would but he will have to pay a $50 co-pay. Can he be seen here or make appointment there

## 2018-01-27 NOTE — Telephone Encounter (Signed)
Patient notified

## 2018-01-27 NOTE — Telephone Encounter (Signed)
He can be seen here if that's his preference.

## 2018-01-27 NOTE — Telephone Encounter (Signed)
Please call Jeffery [name] and ask if he needs new referral to ENT or if he is able to call to make an appointment himself. I recommend he see ENT if symptoms are not resolving.

## 2018-01-30 ENCOUNTER — Ambulatory Visit: Payer: BLUE CROSS/BLUE SHIELD | Admitting: Family Medicine

## 2018-01-30 NOTE — Telephone Encounter (Signed)
Erroneous Entry  

## 2018-02-03 DIAGNOSIS — G4733 Obstructive sleep apnea (adult) (pediatric): Secondary | ICD-10-CM | POA: Diagnosis not present

## 2018-02-19 ENCOUNTER — Telehealth: Payer: Self-pay | Admitting: Family Medicine

## 2018-02-19 DIAGNOSIS — I10 Essential (primary) hypertension: Secondary | ICD-10-CM

## 2018-02-19 NOTE — Telephone Encounter (Signed)
He missed his 12/6 appt - needs to be seen for any more refills. 15-day supply is provided.

## 2018-03-15 ENCOUNTER — Other Ambulatory Visit: Payer: Self-pay | Admitting: Family Medicine

## 2018-03-15 DIAGNOSIS — I10 Essential (primary) hypertension: Secondary | ICD-10-CM

## 2018-03-16 ENCOUNTER — Ambulatory Visit: Payer: BLUE CROSS/BLUE SHIELD | Admitting: Family Medicine

## 2018-03-16 ENCOUNTER — Encounter: Payer: Self-pay | Admitting: Family Medicine

## 2018-03-16 VITALS — BP 130/76 | HR 93 | Temp 97.9°F | Resp 16 | Ht 70.5 in | Wt 239.2 lb

## 2018-03-16 DIAGNOSIS — K219 Gastro-esophageal reflux disease without esophagitis: Secondary | ICD-10-CM

## 2018-03-16 DIAGNOSIS — E6609 Other obesity due to excess calories: Secondary | ICD-10-CM

## 2018-03-16 DIAGNOSIS — Z6833 Body mass index (BMI) 33.0-33.9, adult: Secondary | ICD-10-CM

## 2018-03-16 DIAGNOSIS — I10 Essential (primary) hypertension: Secondary | ICD-10-CM | POA: Diagnosis not present

## 2018-03-16 DIAGNOSIS — E782 Mixed hyperlipidemia: Secondary | ICD-10-CM | POA: Diagnosis not present

## 2018-03-16 DIAGNOSIS — E291 Testicular hypofunction: Secondary | ICD-10-CM

## 2018-03-16 DIAGNOSIS — Z716 Tobacco abuse counseling: Secondary | ICD-10-CM

## 2018-03-16 DIAGNOSIS — R0789 Other chest pain: Secondary | ICD-10-CM | POA: Insufficient documentation

## 2018-03-16 DIAGNOSIS — N138 Other obstructive and reflux uropathy: Secondary | ICD-10-CM

## 2018-03-16 DIAGNOSIS — N401 Enlarged prostate with lower urinary tract symptoms: Secondary | ICD-10-CM

## 2018-03-16 MED ORDER — LISINOPRIL 10 MG PO TABS: ORAL_TABLET | ORAL | 0 refills | Status: DC

## 2018-03-16 NOTE — Progress Notes (Addendum)
Name: Jeffery [name]   MRN: [id number]    DOB: June 05, 1970   Date:03/16/2018       Progress Note  Subjective  Chief Complaint  Chief Complaint  Patient presents with  . Hypertension  . Medication Refill    HPI  HTN:  -does take medications as prescribed - current regimen includes Lisinopril 10mg  daily - has been out for a few days.  - taking medications as instructed, no medication side effects noted, no TIAs, no dyspnea on exertion, no swelling of ankles. - DASH diet discussed - pt does not follow a low sodium diet; salt not added to cooking and salt shaker not on table - The following  are contributing factors: stress, sedentary lifestyle, saturated fats in diet, smoking.  Tobacco Abuse: 1ppd for >25 years.  Denies shortness of breath or wheezing.  Not ready to quit at this time.   HLD: TGD's and LDL were out of control at last check in [place] - taking vascepa and lipitor.  Denies myalgias or shortness of breath.  See below regarding chest pain.   Chest Pain: LEFT-sided chest pain just under the left breast that is intermittent - a few times over the last 2-3 months, episodes last for a few minutes at the most - described as sharp. Has >25pack year hx of smoking. Also has HLD and HTN and is obese.  Denies shortness of breath, palpitations/fluttering.  Stays fatigued - works 16-18 hour days 5-6 days a week.  We will check EKG and refer to cardiology today.  GERD: Was taking zantac, but it got recalled. Triggers include spicy foods.  Endorses heart burn but no regurgitation, no difficulty swallowing or abdominal pain. Does not want something OTC. We will send in omeprazole.  BPH/hypogonadism: Seeing urology with [facility] for BPH - taking cialis 5mg  daily.  Takes flomax PRN only. Doing well on this. He is using androgel daily to help with fatigue.  Patient Active Problem List   Diagnosis Date Noted  . Essential hypertension 12/22/2017  . Tobacco abuse counseling 10/10/2017  . Biceps  tendon rupture, right, subsequent encounter 03/20/2017  . Hypogonadism male 09/19/2016  . Lower urinary tract symptoms (LUTS) 06/14/2016  . Family history of type 2 diabetes mellitus in mother 04/05/2016  . Urinary incontinence 03/18/2016  . 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 Dyslipidemia 03/10/2015  . 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 Hypotestosteronism 03/10/2015  . Opioid type dependence, abuse ([place]) 03/10/2015  . Adiposity 03/10/2015  . Acute upper back pain 12/23/2014  . Encounter for annual physical exam 12/23/2014  . History of nasal septoplasty 11/23/2014  . Chronic prostatitis 10/21/2014  . BPH (benign prostatic hypertrophy) 10/21/2014  . Overweight (BMI 25.0-29.9) 10/21/2014  . Umbilical hernia 40/98/1191   Past Surgical History:  Procedure Laterality Date  . HERNIA REPAIR  4/78/29   umbilical hernia  . NASAL SEPTOPLASTY W/ TURBINOPLASTY Bilateral 11/23/2014   Procedure: NASAL SEPTOPLASTY WITH TURBINATE REDUCTION;  Surgeon: Carloyn Manner, MD;  Location: [facility];  Service: ENT;  Laterality: Bilateral;  . PROSTATE BIOPSY  2015  . ROOT CANAL    . WRIST FRACTURE SURGERY Right     Family History  Problem Relation Age of Onset  . Diabetes Mother   . Cancer Mother        Colon Cancer  . Hypertension Father   . Cancer Maternal Grandmother        Lung-  Non-Smoker    Social History  Socioeconomic History  . Marital status: Married    Spouse name: Not on file  . Number of children: 3  . Years of education: Not on file  . Highest education level: High school graduate  Occupational History  . Occupation: Armed forces training and education officer  Social Needs  . Financial resource strain: Not hard at all  . Food insecurity:    Worry: Never true    Inability: Never true  . Transportation needs:    Medical: No    Non-medical: No  Tobacco Use  . Smoking status: Current Every Day Smoker    Packs/day: 1.00    Years: 25.00    Pack  years: 25.00    Types: Cigarettes, E-cigarettes  . Smokeless tobacco: Never Used  . Tobacco comment: Plans to stop in March  Substance and Sexual Activity  . Alcohol use: No    Alcohol/week: 0.0 standard drinks  . Drug use: No  . Sexual activity: Yes    Partners: Female  Lifestyle  . Physical activity:    Days per week: 0 days    Minutes per session: 0 min  . Stress: Not at all  Relationships  . Social connections:    Talks on phone: More than three times a week    Gets together: More than three times a week    Attends religious service: More than 4 times per year    Active member of club or organization: Yes    Attends meetings of clubs or organizations: More than 4 times per year    Relationship status: Never married  . Intimate partner violence:    Fear of current or ex partner: No    Emotionally abused: No    Physically abused: No    Forced sexual activity: No  Other Topics Concern  . Not on file  Social History Narrative  . Not on file     Current Outpatient Medications:  .  atorvastatin (LIPITOR) 40 MG tablet, Take 1 tablet (40 mg total) by mouth daily., Disp: 90 tablet, Rfl: 1 .  guaiFENesin (MUCINEX) 600 MG 12 hr tablet, Take 1 tablet (600 mg total) by mouth 2 (two) times daily as needed for cough or to loosen phlegm., Disp: 20 tablet, Rfl: 0 .  ibuprofen (ADVIL,MOTRIN) 600 MG tablet, Take 1 tablet (600 mg total) by mouth every 8 (eight) hours as needed., Disp: 30 tablet, Rfl: 1 .  Icosapent Ethyl 1 g CAPS, Take 2 capsules (2 g total) by mouth 2 (two) times daily., Disp: 180 capsule, Rfl: 1 .  lisinopril (PRINIVIL,ZESTRIL) 10 MG tablet, TAKE 1 TABLET(10 MG) BY MOUTH DAILY, Disp: 15 tablet, Rfl: 0 .  naproxen (NAPROSYN) 500 MG tablet, Take 1 tablet (500 mg total) by mouth 2 (two) times daily with a meal., Disp: 60 tablet, Rfl: 0 .  SUMAtriptan (IMITREX) 100 MG tablet, Take 1 tablet (100 mg total) by mouth every 2 (two) hours as needed for migraine. May repeat in 2  hours if headache persists or recurs. No more than 2 doses in 24 hours, Disp: 10 tablet, Rfl: 0 .  tadalafil (CIALIS) 5 MG tablet, Take by mouth., Disp: , Rfl:  .  tamsulosin (FLOMAX) 0.4 MG CAPS capsule, Take by mouth., Disp: , Rfl:  .  Testosterone (ANDROGEL PUMP) 20.25 MG/ACT (1.62%) GEL, Apply 1 application topically daily., Disp: 75 g, Rfl: 0 .  varenicline (CHANTIX STARTING MONTH PAK) 0.5 MG X 11 & 1 MG X 42 tablet, Take one 0.5 mg tablet by mouth once  daily for 3 days, then increase to one 0.5 mg tablet twice daily for 4 days, then increase to one 1 mg tablet twice daily., Disp: 53 tablet, Rfl: 0  Allergies  Allergen Reactions  . Doxycycline Other (See Comments) and Nausea And Vomiting    I personally reviewed active problem list, medication list, allergies, health maintenance, notes from last encounter, lab results with the patient/caregiver today.   ROS Ten systems reviewed and is negative except as mentioned in HPI  Objective  Vitals:   03/16/18 1336  BP: 130/76  Pulse: 93  Resp: 16  Temp: 97.9 F (36.6 C)  TempSrc: Oral  SpO2: 95%  Weight: 239 lb 3.2 oz (108.5 kg)  Height: 5' 10.5" (1.791 m)   Body mass index is 33.84 kg/m.  Physical Exam Constitutional: Patient appears well-developed and well-nourished. No distress.  HENT: Head: Normocephalic and atraumatic. Eyes: Conjunctivae and EOM are normal. No scleral icterus.  Neck: Normal range of motion. Neck supple. No JVD present. No thyromegaly present.  Cardiovascular: Normal rate, regular rhythm and normal heart sounds.  No murmur heard. No BLE edema. Pulmonary/Chest: Effort normal and breath sounds normal. No respiratory distress. Musculoskeletal: Normal range of motion, no joint effusions. No gross deformities Neurological: Pt is alert and oriented to person, place, and time. No cranial nerve deficit. Coordination, balance, strength, speech and gait are normal.  Skin: Skin is warm and dry. No rash noted. No  erythema.  Psychiatric: Patient has a normal mood and affect. behavior is normal. Judgment and thought content normal.  No results found for this or any previous visit (from the past 72 hour(s)).  PHQ2/9: Depression screen [facility] 2/9 03/16/2018 01/09/2018 12/22/2017 10/10/2017 07/02/2017  Decreased Interest 0 0 0 0 0  Down, Depressed, Hopeless 0 0 0 0 0  PHQ - 2 Score 0 0 0 0 0  Altered sleeping - 0 0 0 -  Tired, decreased energy - 0 0 0 -  Change in appetite - 0 0 0 -  Feeling bad or failure about yourself  - 0 0 0 -  Trouble concentrating - 0 0 0 -  Moving slowly or fidgety/restless - 0 0 0 -  Suicidal thoughts - 0 0 0 -  PHQ-9 Score - 0 - 0 -  Difficult doing work/chores - Not difficult at all Not difficult at all Not difficult at all -   Fall Risk: Fall Risk  03/16/2018 01/09/2018 12/22/2017 10/10/2017 07/02/2017  Falls in the past year? 0 0 No No No  Number falls in past yr: 0 - - - -  Injury with Fall? 0 - - - -   Assessment & Plan  1. Class 1 obesity due to excess calories with serious comorbidity and body mass index (BMI) of 33.0 to 33.9 in adult - Discussed importance of 150 minutes of physical activity weekly, eat two servings of fish weekly, eat one serving of tree nuts ( cashews, pistachios, pecans, almonds.Marland Kitchen) every other day, eat 6 servings of fruit/vegetables daily and drink plenty of water and avoid sweet beverages.  - Ambulatory referral to Cardiology - CBC w/Diff/Platelet  2. Essential hypertension - lisinopril (PRINIVIL,ZESTRIL) 10 MG tablet; TAKE 1 TABLET(10 MG) BY MOUTH DAILY  Dispense: 15 tablet; Refill: 0 - Ambulatory referral to Cardiology - COMPLETE METABOLIC PANEL WITH GFR - DASH diet discussed in detail.  3. Mixed hyperlipidemia - Lipid panel  4. Tobacco abuse counseling - Ambulatory referral to Cardiology  5. Benign prostatic hyperplasia with urinary obstruction -  Keep follow up with urology  6. Other chest pain - Ambulatory referral to Cardiology -  Lipid panel - COMPLETE METABOLIC PANEL WITH GFR - CBC w/Diff/Platelet - EKG - NSR  7. Hypogonadism male - Keep follow up with urology.

## 2018-03-17 DIAGNOSIS — K219 Gastro-esophageal reflux disease without esophagitis: Secondary | ICD-10-CM | POA: Insufficient documentation

## 2018-03-17 MED ORDER — LISINOPRIL 10 MG PO TABS: ORAL_TABLET | ORAL | 1 refills | Status: DC

## 2018-03-17 MED ORDER — OMEPRAZOLE 20 MG PO CPDR: 20.0000 mg | DELAYED_RELEASE_CAPSULE | Freq: Every day | ORAL | 0 refills | Status: DC

## 2018-03-17 NOTE — Addendum Note (Signed)
Addended by: Hubbard Hartshorn on: 03/17/2018 02:58 PM   Modules accepted: Orders

## 2018-03-17 NOTE — Telephone Encounter (Signed)
Lisinopril Rx is fixed; I sent in omeprazole for his reflux, but I don't recommend this long-term.  We can re-evaluate after taking for a few months and decide if we can switch to famotidine.

## 2018-03-17 NOTE — Telephone Encounter (Signed)
Copied from [place] [id number]. Topic: General - Other >> Mar 16, 2018  5:26 PM Windy Kalata wrote: Reason for CRM: Patient states that there was only 15 pills in his lisinopril (PRINIVIL,ZESTRIL) 10 MG tablet prescription he picked up today and also she was going to send in something for his heartburn, he needs something that is not over the counter he states he discussed with Dr. Uvaldo Rising. Please advise  Best call back is [phone]

## 2018-03-18 NOTE — Telephone Encounter (Signed)
Patient notified

## 2018-04-20 DIAGNOSIS — G4733 Obstructive sleep apnea (adult) (pediatric): Secondary | ICD-10-CM | POA: Diagnosis not present

## 2018-04-24 DIAGNOSIS — J320 Chronic maxillary sinusitis: Secondary | ICD-10-CM | POA: Diagnosis not present

## 2018-04-24 DIAGNOSIS — G4733 Obstructive sleep apnea (adult) (pediatric): Secondary | ICD-10-CM | POA: Diagnosis not present

## 2018-04-24 DIAGNOSIS — I10 Essential (primary) hypertension: Secondary | ICD-10-CM | POA: Diagnosis not present

## 2018-04-24 DIAGNOSIS — Z9889 Other specified postprocedural states: Secondary | ICD-10-CM | POA: Diagnosis not present

## 2018-04-24 DIAGNOSIS — Z833 Family history of diabetes mellitus: Secondary | ICD-10-CM | POA: Diagnosis not present

## 2018-04-24 DIAGNOSIS — E785 Hyperlipidemia, unspecified: Secondary | ICD-10-CM | POA: Diagnosis not present

## 2018-05-22 DIAGNOSIS — R079 Chest pain, unspecified: Secondary | ICD-10-CM | POA: Diagnosis not present

## 2018-05-28 DIAGNOSIS — I10 Essential (primary) hypertension: Secondary | ICD-10-CM | POA: Diagnosis not present

## 2018-06-05 DIAGNOSIS — G4733 Obstructive sleep apnea (adult) (pediatric): Secondary | ICD-10-CM | POA: Diagnosis not present

## 2018-06-09 ENCOUNTER — Other Ambulatory Visit: Payer: Self-pay

## 2018-06-09 ENCOUNTER — Ambulatory Visit (INDEPENDENT_AMBULATORY_CARE_PROVIDER_SITE_OTHER): Payer: BLUE CROSS/BLUE SHIELD | Admitting: Family Medicine

## 2018-06-09 ENCOUNTER — Encounter: Payer: Self-pay | Admitting: Family Medicine

## 2018-06-09 DIAGNOSIS — J0100 Acute maxillary sinusitis, unspecified: Secondary | ICD-10-CM | POA: Diagnosis not present

## 2018-06-09 DIAGNOSIS — R6889 Other general symptoms and signs: Secondary | ICD-10-CM

## 2018-06-09 DIAGNOSIS — Z20822 Contact with and (suspected) exposure to covid-19: Secondary | ICD-10-CM

## 2018-06-09 MED ORDER — LEVOCETIRIZINE DIHYDROCHLORIDE 5 MG PO TABS: 5.0000 mg | ORAL_TABLET | Freq: Every evening | ORAL | 1 refills | Status: DC

## 2018-06-09 MED ORDER — AZELASTINE-FLUTICASONE 137-50 MCG/ACT NA SUSP: 1.0000 | Freq: Every day | NASAL | 2 refills | Status: DC

## 2018-06-09 MED ORDER — AMOXICILLIN-POT CLAVULANATE 875-125 MG PO TABS: 1.0000 | ORAL_TABLET | Freq: Two times a day (BID) | ORAL | 0 refills | Status: AC

## 2018-06-09 NOTE — Progress Notes (Addendum)
Name: Jeffery [name]   MRN: [id number]    DOB: 1970-08-02   Date:06/09/2018       Progress Note  Subjective  Chief Complaint  Chief Complaint  Patient presents with  . Sore Throat    congestion, cough for 4 days    I connected with  [name]  on 06/09/18 at  8:40 AM EDT by a video enabled telemedicine application and verified that I am speaking with the correct person using two identifiers.  I discussed the limitations of evaluation and management by telemedicine and the availability of in person appointments. The patient expressed understanding and agreed to proceed. Staff also discussed with the patient that there may be a patient responsible charge related to this service. Patient Location: [place] Location: Home Additional Individuals present: None  HPI  Pt presents with concern with for nasal congestion and sore throat for about 4 days. Denies fevers/chills, chest tightness, shortness of breath.  Works in dry wall.  He has not traveled anywhere - going to work and to home. He is taking Zyrtec, not using a nasal spray yet.  Denies recent contact with anyone with COVID-19 or similar symptoms, no recent travel.  Did discuss our general OYDXA-12 policies at present, in an abundance of caution, we will keep him out of work until he meets non-testing RTW criteria.   Patient Active Problem List   Diagnosis Date Noted  . Gastroesophageal reflux disease without esophagitis 03/17/2018  . Other chest pain 03/16/2018  . Mixed hyperlipidemia 03/16/2018  . Essential hypertension 12/22/2017  . Tobacco abuse counseling 10/10/2017  . Biceps tendon rupture, right, subsequent encounter 03/20/2017  . Hypogonadism male 09/19/2016  . Family history of type 2 diabetes mellitus in mother 04/05/2016  . Urinary incontinence 03/18/2016  . 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 Dyslipidemia 03/10/2015  . 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 Opioid type  dependence, abuse ([place]) 03/10/2015  . Adiposity 03/10/2015  . Encounter for annual physical exam 12/23/2014  . History of nasal septoplasty 11/23/2014  . Chronic prostatitis 10/21/2014  . Benign prostatic hyperplasia 10/21/2014  . Overweight (BMI 25.0-29.9) 10/21/2014  . Umbilical hernia 87/86/7672    Social History   Tobacco Use  . Smoking status: Current Every Day Smoker    Packs/day: 1.00    Years: 25.00    Pack years: 25.00    Types: Cigarettes, E-cigarettes  . Smokeless tobacco: Never Used  . Tobacco comment: Plans to stop in March  Substance Use Topics  . Alcohol use: No    Alcohol/week: 0.0 standard drinks     Current Outpatient Medications:  .  atorvastatin (LIPITOR) 40 MG tablet, Take 1 tablet (40 mg total) by mouth daily., Disp: 90 tablet, Rfl: 1 .  ibuprofen (ADVIL,MOTRIN) 600 MG tablet, Take 1 tablet (600 mg total) by mouth every 8 (eight) hours as needed., Disp: 30 tablet, Rfl: 1 .  Icosapent Ethyl 1 g CAPS, Take 2 capsules (2 g total) by mouth 2 (two) times daily., Disp: 180 capsule, Rfl: 1 .  lisinopril (PRINIVIL,ZESTRIL) 10 MG tablet, TAKE 1 TABLET(10 MG) BY MOUTH DAILY, Disp: 90 tablet, Rfl: 1 .  omeprazole (PRILOSEC) 20 MG capsule, Take 1 capsule (20 mg total) by mouth daily., Disp: 90 capsule, Rfl: 0 .  SUMAtriptan (IMITREX) 100 MG tablet, Take 1 tablet (100 mg total) by mouth every 2 (two) hours as needed for migraine. May repeat in 2 hours if headache persists or recurs. No more  than 2 doses in 24 hours, Disp: 10 tablet, Rfl: 0 .  tadalafil (CIALIS) 5 MG tablet, Take by mouth., Disp: , Rfl:  .  tamsulosin (FLOMAX) 0.4 MG CAPS capsule, Take by mouth., Disp: , Rfl:  .  Testosterone (ANDROGEL PUMP) 20.25 MG/ACT (1.62%) GEL, Apply 1 application topically daily., Disp: 75 g, Rfl: 0 .  varenicline (CHANTIX STARTING MONTH PAK) 0.5 MG X 11 & 1 MG X 42 tablet, Take one 0.5 mg tablet by mouth once daily for 3 days, then increase to one 0.5 mg tablet twice daily for 4  days, then increase to one 1 mg tablet twice daily., Disp: 53 tablet, Rfl: 0 .  naproxen (NAPROSYN) 500 MG tablet, Take 1 tablet (500 mg total) by mouth 2 (two) times daily with a meal., Disp: 60 tablet, Rfl: 0  Allergies  Allergen Reactions  . Doxycycline Other (See Comments) and Nausea And Vomiting    I personally reviewed active problem list, medication list, allergies with the patient/caregiver today.  ROS  Ten systems reviewed and is negative except as mentioned in HPI  Objective  Virtual encounter, vitals not obtained.  There is no height or weight on file to calculate BMI.  Nursing Note and Vital Signs reviewed.  Physical Exam  Constitutional: Patient appears well-developed and well-nourished. No distress.  HENT: Head: Normocephalic and atraumatic. Pt palpates sinuses - endorses maxillary tenderness only. Neck: Normal range of motion. Pulmonary/Chest: Effort normal. No respiratory distress. Speaking in complete sentences Neurological: Pt is alert and oriented to person, place, and time. Coordination, speech and gait are normal.  Psychiatric: Patient has a normal mood and affect. behavior is normal. Judgment and thought content normal.  No results found for this or any previous visit (from the past 72 hour(s)).  Assessment & Plan  1. Acute non-recurrent maxillary sinusitis - levocetirizine (XYZAL) 5 MG tablet; Take 1 tablet (5 mg total) by mouth every evening.  Dispense: 90 tablet; Refill: 1 - Azelastine-Fluticasone 137-50 MCG/ACT SUSP; Place 1 spray into the nose daily.  Dispense: 23 g; Refill: 2 - amoxicillin-clavulanate (AUGMENTIN) 875-125 MG tablet; Take 1 tablet by mouth 2 (two) times daily for 10 days.  Dispense: 20 tablet; Refill: 0  2. Suspected Covid-19 Virus Infection Once Mychart Monitoring is up and running this afternoon, I will place order for this to be utilized for the patient. Work note with return to work criteria to be provided via Pharmacist, community.  -Red  flags and when to present for emergency care or RTC including fever >101.62F, chest pain, shortness of breath, new/worsening/un-resolving symptoms, reviewed with patient at time of visit. Follow up and care instructions discussed and provided in AVS. - I discussed the assessment and treatment plan with the patient. The patient was provided an opportunity to ask questions and all were answered. The patient agreed with the plan and demonstrated an understanding of the instructions.  I provided 15 minutes of non-face-to-face time during this encounter.  Hubbard Hartshorn, FNP

## 2018-06-11 ENCOUNTER — Telehealth: Payer: Self-pay | Admitting: Family Medicine

## 2018-06-11 MED ORDER — AZELASTINE HCL 0.1 % NA SOLN: 2.0000 | Freq: Two times a day (BID) | NASAL | 12 refills | Status: DC

## 2018-06-11 MED ORDER — FLUTICASONE PROPIONATE 50 MCG/ACT NA SUSP: 2.0000 | Freq: Every day | NASAL | 6 refills | Status: DC

## 2018-06-11 NOTE — Addendum Note (Signed)
Addended by: Hubbard Hartshorn on: 06/11/2018 01:27 PM   Modules accepted: Orders

## 2018-06-11 NOTE — Telephone Encounter (Signed)
Spoke with Jeffery [name] directly, he is feeling much better, no fever for 4 days.  Symptom onset was 06/06/2018, so he is cleared to return 06/13/2018.

## 2018-06-16 ENCOUNTER — Telehealth: Payer: Self-pay

## 2018-06-16 NOTE — Telephone Encounter (Signed)
[employer] sent [place] that patient Jeffery [name] was denied and has to try two alternative medication on the member's formulary list: Azelastine nasal spray, Fluticasone and Mometasone nasal spray.

## 2018-06-17 NOTE — Telephone Encounter (Signed)
This was already taken care of on 06/11/2018.

## 2018-07-13 DIAGNOSIS — N401 Enlarged prostate with lower urinary tract symptoms: Secondary | ICD-10-CM | POA: Diagnosis not present

## 2018-07-15 ENCOUNTER — Ambulatory Visit (INDEPENDENT_AMBULATORY_CARE_PROVIDER_SITE_OTHER): Payer: BLUE CROSS/BLUE SHIELD | Admitting: Family Medicine

## 2018-07-15 ENCOUNTER — Encounter: Payer: Self-pay | Admitting: Family Medicine

## 2018-07-15 ENCOUNTER — Other Ambulatory Visit: Payer: Self-pay

## 2018-07-15 DIAGNOSIS — J329 Chronic sinusitis, unspecified: Secondary | ICD-10-CM | POA: Diagnosis not present

## 2018-07-15 MED ORDER — MONTELUKAST SODIUM 10 MG PO TABS: 10.0000 mg | ORAL_TABLET | Freq: Every day | ORAL | 1 refills | Status: DC

## 2018-07-15 MED ORDER — AZELASTINE HCL 0.1 % NA SOLN: 2.0000 | Freq: Two times a day (BID) | NASAL | 12 refills | Status: DC

## 2018-07-15 NOTE — Progress Notes (Signed)
Name: Jeffery [name]   MRN: [id number]    DOB: Apr 10, 1970   Date:07/15/2018       Progress Note  Subjective  Chief Complaint  Chief Complaint  Patient presents with  . Sinusitis    onset 1 month sinus pressure and sore throat, has already had round of antibiotic and has been tested neg for covid    I connected with  [name]  on 07/15/18 at 10:40 AM EDT by a video enabled telemedicine application and verified that I am speaking with the correct person using two identifiers.  I discussed the limitations of evaluation and management by telemedicine and the availability of in person appointments. The patient expressed understanding and agreed to proceed. Staff also discussed with the patient that there may be a patient responsible charge related to this service. Patient Location: Home Provider Location: Home Additional Individuals present: None  HPI  Pt presents for concern for ongoing Sinusitis/sinus headaches.  He was treated for sinusitis 06/09/2018 with Augmentin and this did not seem to help much.  He is still having congestion, headache and head pressure.  He wears CPAP and reached out to his sleep med provider today to adjust these pressures to help with sore throat and dry mouth.  He is increasing the humidity to try to help with this.  His last visit to ENT was in Jan/Feb 2020.  He is taking Xyzal and Flonase.  He was rx'd azelastine and he did not receive from the pharmacy.  He was COVID-19 recently and this was negative ( 07/08/2018).   - He has never taken singulair in the past, did discuss risk and new black box warning - he would like to try this medication.  - Denies fevers/chills, ocular swelling, pain with ocular movement.     Patient Active Problem List   Diagnosis Date Noted  . Gastroesophageal reflux disease without esophagitis 03/17/2018  . Other chest pain 03/16/2018  . Mixed hyperlipidemia 03/16/2018  . Essential hypertension 12/22/2017  . Tobacco abuse  counseling 10/10/2017  . Biceps tendon rupture, right, subsequent encounter 03/20/2017  . Hypogonadism male 09/19/2016  . Family history of type 2 diabetes mellitus in mother 04/05/2016  . Urinary incontinence 03/18/2016  . 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 Dyslipidemia 03/10/2015  . 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 Opioid type dependence, abuse ([place]) 03/10/2015  . Adiposity 03/10/2015  . Encounter for annual physical exam 12/23/2014  . History of nasal septoplasty 11/23/2014  . Chronic prostatitis 10/21/2014  . Benign prostatic hyperplasia 10/21/2014  . Overweight (BMI 25.0-29.9) 10/21/2014  . Umbilical hernia 82/50/5397    Social History   Tobacco Use  . Smoking status: Current Every Day Smoker    Packs/day: 1.00    Years: 25.00    Pack years: 25.00    Types: Cigarettes, E-cigarettes  . Smokeless tobacco: Never Used  . Tobacco comment: Plans to stop in March  Substance Use Topics  . Alcohol use: No    Alcohol/week: 0.0 standard drinks     Current Outpatient Medications:  .  atorvastatin (LIPITOR) 40 MG tablet, Take 1 tablet (40 mg total) by mouth daily., Disp: 90 tablet, Rfl: 1 .  fluticasone (FLONASE) 50 MCG/ACT nasal spray, Place 2 sprays into both nostrils daily., Disp: 16 g, Rfl: 6 .  ibuprofen (ADVIL,MOTRIN) 600 MG tablet, Take 1 tablet (600 mg total) by mouth every 8 (eight) hours as needed., Disp: 30 tablet, Rfl: 1 .  Icosapent Ethyl  1 g CAPS, Take 2 capsules (2 g total) by mouth 2 (two) times daily., Disp: 180 capsule, Rfl: 1 .  levocetirizine (XYZAL) 5 MG tablet, Take 1 tablet (5 mg total) by mouth every evening., Disp: 90 tablet, Rfl: 1 .  lisinopril (PRINIVIL,ZESTRIL) 10 MG tablet, TAKE 1 TABLET(10 MG) BY MOUTH DAILY, Disp: 90 tablet, Rfl: 1 .  omeprazole (PRILOSEC) 20 MG capsule, Take 1 capsule (20 mg total) by mouth daily., Disp: 90 capsule, Rfl: 0 .  SUMAtriptan (IMITREX) 100 MG tablet, Take 1 tablet (100 mg total)  by mouth every 2 (two) hours as needed for migraine. May repeat in 2 hours if headache persists or recurs. No more than 2 doses in 24 hours, Disp: 10 tablet, Rfl: 0 .  tadalafil (CIALIS) 5 MG tablet, Take by mouth., Disp: , Rfl:  .  Testosterone (ANDROGEL PUMP) 20.25 MG/ACT (1.62%) GEL, Apply 1 application topically daily., Disp: 75 g, Rfl: 0 .  azelastine (ASTELIN) 0.1 % nasal spray, Place 2 sprays into both nostrils 2 (two) times daily. Use in each nostril as directed (Patient not taking: Reported on 07/15/2018), Disp: 30 mL, Rfl: 12 .  naproxen (NAPROSYN) 500 MG tablet, Take 1 tablet (500 mg total) by mouth 2 (two) times daily with a meal. (Patient not taking: Reported on 07/15/2018), Disp: 60 tablet, Rfl: 0 .  tamsulosin (FLOMAX) 0.4 MG CAPS capsule, Take by mouth., Disp: , Rfl:  .  varenicline (CHANTIX STARTING MONTH PAK) 0.5 MG X 11 & 1 MG X 42 tablet, Take one 0.5 mg tablet by mouth once daily for 3 days, then increase to one 0.5 mg tablet twice daily for 4 days, then increase to one 1 mg tablet twice daily. (Patient not taking: Reported on 07/15/2018), Disp: 53 tablet, Rfl: 0  Allergies  Allergen Reactions  . Doxycycline Other (See Comments) and Nausea And Vomiting    I personally reviewed active problem list, medication list, allergies with the patient/caregiver today.  ROS  Ten systems reviewed and is negative except as mentioned in HPI.  Objective  Virtual encounter, vitals not obtained.  There is no height or weight on file to calculate BMI.  Nursing Note and Vital Signs reviewed.  Physical Exam  Constitutional: Patient appears well-developed and well-nourished. No distress.  HENT: Head: Normocephalic and atraumatic.  Neck: Normal range of motion. Pulmonary/Chest: Effort normal. No respiratory distress. Speaking in complete sentences Neurological: Pt is alert and oriented to person, place, and time. Coordination, speech and gait are normal.  Psychiatric: Patient has a  normal mood and affect. behavior is normal. Judgment and thought content normal.  No results found for this or any previous visit (from the past 72 hour(s)).  Assessment & Plan  1. Chronic recurrent sinusitis - azelastine (ASTELIN) 0.1 % nasal spray; Place 2 sprays into both nostrils 2 (two) times daily. Use in each nostril as directed  Dispense: 30 mL; Refill: 12 - Ambulatory referral to ENT - montelukast (SINGULAIR) 10 MG tablet; Take 1 tablet (10 mg total) by mouth at bedtime.  Dispense: 30 tablet; Refill: 1 - Did discuss new black box warning for singulair, he would still like to trial medication.  We will send in today.  I did advise likely needs CT of sinuses, he will discuss further with ENT to determine necessity.  We will hold off on antibiotic use at this time.  -Red flags and when to present for emergency care or RTC including fever >101.58F, chest pain, shortness of breath, new/worsening/un-resolving symptoms,  reviewed with patient at time of visit. Follow up and care instructions discussed and provided in AVS. - I discussed the assessment and treatment plan with the patient. The patient was provided an opportunity to ask questions and all were answered. The patient agreed with the plan and demonstrated an understanding of the instructions.  I provided 16 minutes of non-face-to-face time during this encounter.  Hubbard Hartshorn, FNP

## 2018-07-21 DIAGNOSIS — G4733 Obstructive sleep apnea (adult) (pediatric): Secondary | ICD-10-CM | POA: Diagnosis not present

## 2018-07-24 DIAGNOSIS — E349 Endocrine disorder, unspecified: Secondary | ICD-10-CM | POA: Diagnosis not present

## 2018-07-26 DIAGNOSIS — Z1159 Encounter for screening for other viral diseases: Secondary | ICD-10-CM | POA: Diagnosis not present

## 2018-08-05 ENCOUNTER — Encounter: Payer: Self-pay | Admitting: Family Medicine

## 2018-08-10 ENCOUNTER — Other Ambulatory Visit: Payer: Self-pay

## 2018-08-10 ENCOUNTER — Ambulatory Visit (INDEPENDENT_AMBULATORY_CARE_PROVIDER_SITE_OTHER): Payer: BC Managed Care – PPO | Admitting: Family Medicine

## 2018-08-10 ENCOUNTER — Encounter: Payer: Self-pay | Admitting: Family Medicine

## 2018-08-10 VITALS — Wt 248.0 lb

## 2018-08-10 DIAGNOSIS — E782 Mixed hyperlipidemia: Secondary | ICD-10-CM

## 2018-08-10 DIAGNOSIS — R635 Abnormal weight gain: Secondary | ICD-10-CM

## 2018-08-10 DIAGNOSIS — R632 Polyphagia: Secondary | ICD-10-CM

## 2018-08-10 DIAGNOSIS — E663 Overweight: Secondary | ICD-10-CM

## 2018-08-10 DIAGNOSIS — I10 Essential (primary) hypertension: Secondary | ICD-10-CM

## 2018-08-10 NOTE — Patient Instructions (Signed)
Calorie Counting for Weight Loss Calories are units of energy. Your body needs a certain amount of calories from food to keep you going throughout the day. When you eat more calories than your body needs, your body stores the extra calories as fat. When you eat fewer calories than your body needs, your body burns fat to get the energy it needs. Calorie counting means keeping track of how many calories you eat and drink each day. Calorie counting can be helpful if you need to lose weight. If you make sure to eat fewer calories than your body needs, you should lose weight. Ask your health care provider what a healthy weight is for you. For calorie counting to work, you will need to eat the right number of calories in a day in order to lose a healthy amount of weight per week. A dietitian can help you determine how many calories you need in a day and will give you suggestions on how to reach your calorie goal.  A healthy amount of weight to lose per week is usually 1-2 lb (0.5-0.9 kg). This usually means that your daily calorie intake should be reduced by 500-750 calories.  Eating 1,200 - 1,500 calories per day can help most women lose weight.  Eating 1,500 - 1,800 calories per day can help most men lose weight. What is my plan? My goal is to have __________ calories per day. If I have this many calories per day, I should lose around __________ pounds per week. What do I need to know about calorie counting? In order to meet your daily calorie goal, you will need to:  Find out how many calories are in each food you would like to eat. Try to do this before you eat.  Decide how much of the food you plan to eat.  Write down what you ate and how many calories it had. Doing this is called keeping a food log. To successfully lose weight, it is important to balance calorie counting with a healthy lifestyle that includes regular activity. Aim for 150 minutes of moderate exercise (such as walking) or 75  minutes of vigorous exercise (such as running) each week. Where do I find calorie information?  The number of calories in a food can be found on a Nutrition Facts label. If a food does not have a Nutrition Facts label, try to look up the calories online or ask your dietitian for help. Remember that calories are listed per serving. If you choose to have more than one serving of a food, you will have to multiply the calories per serving by the amount of servings you plan to eat. For example, the label on a package of bread might say that a serving size is 1 slice and that there are 90 calories in a serving. If you eat 1 slice, you will have eaten 90 calories. If you eat 2 slices, you will have eaten 180 calories. How do I keep a food log? Immediately after each meal, record the following information in your food log:  What you ate. Don't forget to include toppings, sauces, and other extras on the food.  How much you ate. This can be measured in cups, ounces, or number of items.  How many calories each food and drink had.  The total number of calories in the meal. Keep your food log near you, such as in a small notebook in your pocket, or use a mobile app or website. Some programs will calculate  calories for you and show you how many calories you have left for the day to meet your goal. What are some calorie counting tips?   Use your calories on foods and drinks that will fill you up and not leave you hungry: ? Some examples of foods that fill you up are nuts and nut butters, vegetables, lean proteins, and high-fiber foods like whole grains. High-fiber foods are foods with more than 5 g fiber per serving. ? Drinks such as sodas, specialty coffee drinks, alcohol, and juices have a lot of calories, yet do not fill you up.  Eat nutritious foods and avoid empty calories. Empty calories are calories you get from foods or beverages that do not have many vitamins or protein, such as candy, sweets, and  soda. It is better to have a nutritious high-calorie food (such as an avocado) than a food with few nutrients (such as a bag of chips).  Know how many calories are in the foods you eat most often. This will help you calculate calorie counts faster.  Pay attention to calories in drinks. Low-calorie drinks include water and unsweetened drinks.  Pay attention to nutrition labels for "low fat" or "fat free" foods. These foods sometimes have the same amount of calories or more calories than the full fat versions. They also often have added sugar, starch, or salt, to make up for flavor that was removed with the fat.  Find a way of tracking calories that works for you. Get creative. Try different apps or programs if writing down calories does not work for you. What are some portion control tips?  Know how many calories are in a serving. This will help you know how many servings of a certain food you can have.  Use a measuring cup to measure serving sizes. You could also try weighing out portions on a kitchen scale. With time, you will be able to estimate serving sizes for some foods.  Take some time to put servings of different foods on your favorite plates, bowls, and cups so you know what a serving looks like.  Try not to eat straight from a bag or box. Doing this can lead to overeating. Put the amount you would like to eat in a cup or on a plate to make sure you are eating the right portion.  Use smaller plates, glasses, and bowls to prevent overeating.  Try not to multitask (for example, watch TV or use your computer) while eating. If it is time to eat, sit down at a table and enjoy your food. This will help you to know when you are full. It will also help you to be aware of what you are eating and how much you are eating. What are tips for following this plan? Reading food labels  Check the calorie count compared to the serving size. The serving size may be smaller than what you are used to  eating.  Check the source of the calories. Make sure the food you are eating is high in vitamins and protein and low in saturated and trans fats. Shopping  Read nutrition labels while you shop. This will help you make healthy decisions before you decide to purchase your food.  Make a grocery list and stick to it. Cooking  Try to cook your favorite foods in a healthier way. For example, try baking instead of frying.  Use low-fat dairy products. Meal planning  Use more fruits and vegetables. Half of your plate should be fruits  and vegetables.  Include lean proteins like poultry and fish. How do I count calories when eating out?  Ask for smaller portion sizes.  Consider sharing an entree and sides instead of getting your own entree.  If you get your own entree, eat only half. Ask for a box at the beginning of your meal and put the rest of your entree in it so you are not tempted to eat it.  If calories are listed on the menu, choose the lower calorie options.  Choose dishes that include vegetables, fruits, whole grains, low-fat dairy products, and lean protein.  Choose items that are boiled, broiled, grilled, or steamed. Stay away from items that are buttered, battered, fried, or served with cream sauce. Items labeled "crispy" are usually fried, unless stated otherwise.  Choose water, low-fat milk, unsweetened iced tea, or other drinks without added sugar. If you want an alcoholic beverage, choose a lower calorie option such as a glass of wine or light beer.  Ask for dressings, sauces, and syrups on the side. These are usually high in calories, so you should limit the amount you eat.  If you want a salad, choose a garden salad and ask for grilled meats. Avoid extra toppings like bacon, cheese, or fried items. Ask for the dressing on the side, or ask for olive oil and vinegar or lemon to use as dressing.  Estimate how many servings of a food you are given. For example, a serving of  cooked rice is  cup or about the size of half a baseball. Knowing serving sizes will help you be aware of how much food you are eating at restaurants. The list below tells you how big or small some common portion sizes are based on everyday objects: ? 1 oz--4 stacked dice. ? 3 oz--1 deck of cards. ? 1 tsp--1 die. ? 1 Tbsp-- a ping-pong ball. ? 2 Tbsp--1 ping-pong ball. ?  cup-- baseball. ? 1 cup--1 baseball. Summary  Calorie counting means keeping track of how many calories you eat and drink each day. If you eat fewer calories than your body needs, you should lose weight.  A healthy amount of weight to lose per week is usually 1-2 lb (0.5-0.9 kg). This usually means reducing your daily calorie intake by 500-750 calories.  The number of calories in a food can be found on a Nutrition Facts label. If a food does not have a Nutrition Facts label, try to look up the calories online or ask your dietitian for help.  Use your calories on foods and drinks that will fill you up, and not on foods and drinks that will leave you hungry.  Use smaller plates, glasses, and bowls to prevent overeating. This information is not intended to replace advice given to you by your health care provider. Make sure you discuss any questions you have with your health care provider. Document Released: 02/11/2005 Document Revised: 10/31/2017 Document Reviewed: 01/12/2016 Elsevier Interactive Patient Education  2019 [place] refers to food and lifestyle choices that are based on the traditions of countries located on the [employer]. This way of eating has been shown to help prevent certain conditions and improve outcomes for people who have chronic diseases, like kidney disease and heart disease. What are tips for following this plan? Lifestyle  Cook and eat meals together with your family, when possible.  Drink enough fluid to keep your urine clear or pale  yellow.  Be physically active  every day. This includes: ? Aerobic exercise like running or swimming. ? Leisure activities like gardening, walking, or housework.  Get 7-8 hours of sleep each night.  If recommended by your health care provider, drink red wine in moderation. This means 1 glass a day for nonpregnant women and 2 glasses a day for men. A glass of wine equals 5 oz (150 mL). Reading food labels   Check the serving size of packaged foods. For foods such as rice and pasta, the serving size refers to the amount of cooked product, not dry.  Check the total fat in packaged foods. Avoid foods that have saturated fat or trans fats.  Check the ingredients list for added sugars, such as corn syrup. Shopping  At the grocery store, buy most of your food from the areas near the walls of the store. This includes: ? Fresh fruits and vegetables (produce). ? Grains, beans, nuts, and seeds. Some of these may be available in unpackaged forms or large amounts (in bulk). ? Fresh seafood. ? Poultry and eggs. ? Low-fat dairy products.  Buy whole ingredients instead of prepackaged foods.  Buy fresh fruits and vegetables in-season from local farmers markets.  Buy frozen fruits and vegetables in resealable bags.  If you do not have access to quality fresh seafood, buy precooked frozen shrimp or canned fish, such as tuna, salmon, or sardines.  Buy small amounts of raw or cooked vegetables, salads, or olives from the deli or salad bar at your store.  Stock your pantry so you always have certain foods on hand, such as olive oil, canned tuna, canned tomatoes, rice, pasta, and beans. Cooking  Cook foods with extra-virgin olive oil instead of using butter or other vegetable oils.  Have meat as a side dish, and have vegetables or grains as your main dish. This means having meat in small portions or adding small amounts of meat to foods like pasta or stew.  Use beans or vegetables instead of meat  in common dishes like chili or lasagna.  Experiment with different cooking methods. Try roasting or broiling vegetables instead of steaming or sauteing them.  Add frozen vegetables to soups, stews, pasta, or rice.  Add nuts or seeds for added healthy fat at each meal. You can add these to yogurt, salads, or vegetable dishes.  Marinate fish or vegetables using olive oil, lemon juice, garlic, and fresh herbs. Meal planning   Plan to eat 1 vegetarian meal one day each week. Try to work up to 2 vegetarian meals, if possible.  Eat seafood 2 or more times a week.  Have healthy snacks readily available, such as: ? Vegetable sticks with hummus. ? [place] yogurt. ? Fruit and nut trail mix.  Eat balanced meals throughout the week. This includes: ? Fruit: 2-3 servings a day ? Vegetables: 4-5 servings a day ? Low-fat dairy: 2 servings a day ? Fish, poultry, or lean meat: 1 serving a day ? Beans and legumes: 2 or more servings a week ? Nuts and seeds: 1-2 servings a day ? Whole grains: 6-8 servings a day ? Extra-virgin olive oil: 3-4 servings a day  Limit red meat and sweets to only a few servings a month What are my food choices?  Mediterranean diet ? Recommended ? Grains: Whole-grain pasta. Brown rice. Bulgar wheat. Polenta. Couscous. Whole-wheat bread. [place]. ? Vegetables: Artichokes. Beets. Broccoli. Cabbage. Carrots. Eggplant. Green beans. Chard. Kale. Spinach. Onions. Leeks. Peas. Squash. Tomatoes. Peppers. Radishes. ? Fruits: Apples. Apricots. Avocado. Berries.  Bananas. Cherries. Dates. Figs. Grapes. Lemons. Melon. Oranges. Peaches. Plums. Pomegranate. ? Meats and other protein foods: Beans. Almonds. Sunflower seeds. Pine nuts. Peanuts. [place]. Salmon. Scallops. Shrimp. [place]. Tilapia. Clams. Oysters. Eggs. ? Dairy: Low-fat milk. Cheese. Greek yogurt. ? Beverages: Water. Red wine. Herbal tea. ? Fats and oils: Extra virgin olive oil. Avocado oil. Grape seed oil. ? Sweets and  desserts: [place] yogurt with honey. Baked apples. Poached pears. Trail mix. ? Seasoning and other foods: Basil. Cilantro. Coriander. Cumin. Mint. Parsley. Sage. Rosemary. Tarragon. Garlic. Oregano. Thyme. Pepper. Balsalmic vinegar. Tahini. Hummus. Tomato sauce. Olives. Mushrooms. ? Limit these ? Grains: Prepackaged pasta or rice dishes. Prepackaged cereal with added sugar. ? Vegetables: Deep fried potatoes (french fries). ? Fruits: Fruit canned in syrup. ? Meats and other protein foods: Beef. Pork. Lamb. Poultry with skin. Hot dogs. [place] Salines. ? Dairy: Ice cream. Sour cream. Whole milk. ? Beverages: Juice. Sugar-sweetened soft drinks. Beer. Liquor and spirits. ? Fats and oils: Butter. [place] oil. Vegetable oil. Beef fat (tallow). Lard. ? Sweets and desserts: Cookies. Cakes. Pies. Candy. ? Seasoning and other foods: Mayonnaise. Premade sauces and marinades. ? The items listed may not be a complete list. Talk with your dietitian about what dietary choices are right for you. Summary  The Mediterranean diet includes both food and lifestyle choices.  Eat a variety of fresh fruits and vegetables, beans, nuts, seeds, and whole grains.  Limit the amount of red meat and sweets that you eat.  Talk with your health care provider about whether it is safe for you to drink red wine in moderation. This means 1 glass a day for nonpregnant women and 2 glasses a day for men. A glass of wine equals 5 oz (150 mL). This information is not intended to replace advice given to you by your health care provider. Make sure you discuss any questions you have with your health care provider. Document Released: 10/05/2015 Document Revised: 11/07/2015 Document Reviewed: 10/05/2015 Elsevier Interactive Patient Education  2019 [employer].

## 2018-08-10 NOTE — Progress Notes (Signed)
Name: Jeffery [name]   MRN: [id number]    DOB: Jan 06, 1971   Date:08/10/2018       Progress Note  Subjective  Chief Complaint  Chief Complaint  Patient presents with  . Obesity    gaining weight    I connected with  [name]  on 08/10/18 at  7:20 AM EDT by a video enabled telemedicine application and verified that I am speaking with the correct person using two identifiers.  I discussed the limitations of evaluation and management by telemedicine and the availability of in person appointments. The patient expressed understanding and agreed to proceed. Staff also discussed with the patient that there may be a patient responsible charge related to this service. Patient Location: [facility] - parked Provider Location: Office Additional Individuals present: None  HPI  Pt presents with concern for weight gain over the last year.  He notes about a 50lbs weight gain over the last year.  He notes overeating, especially in the afternoons, also throughout the day.  He does drink enough water.  Had been going to the gym, but due to COVID-19, his gym closed and he has not been exercising.  He did have evaluation from Dr. Saralyn Pilar last year for chest pain and HTN - had stress test and echo which he reports were WNL.  Denies polydipsia or polyuria.  Patient Active Problem List   Diagnosis Date Noted  . Gastroesophageal reflux disease without esophagitis 03/17/2018  . Other chest pain 03/16/2018  . Mixed hyperlipidemia 03/16/2018  . Essential hypertension 12/22/2017  . Tobacco abuse counseling 10/10/2017  . Biceps tendon rupture, right, subsequent encounter 03/20/2017  . Hypogonadism male 09/19/2016  . Family history of type 2 diabetes mellitus in mother 04/05/2016  . Urinary incontinence 03/18/2016  . 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 Dyslipidemia 03/10/2015  . 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 Opioid type dependence, abuse ([place]) 03/10/2015  .  Adiposity 03/10/2015  . Encounter for annual physical exam 12/23/2014  . History of nasal septoplasty 11/23/2014  . Chronic prostatitis 10/21/2014  . Benign prostatic hyperplasia 10/21/2014  . Overweight (BMI 25.0-29.9) 10/21/2014  . Umbilical hernia 27/25/3664    Social History   Tobacco Use  . Smoking status: Current Every Day Smoker    Packs/day: 1.00    Years: 25.00    Pack years: 25.00    Types: Cigarettes, E-cigarettes  . Smokeless tobacco: Never Used  . Tobacco comment: Plans to stop in March  Substance Use Topics  . Alcohol use: No    Alcohol/week: 0.0 standard drinks     Current Outpatient Medications:  .  atorvastatin (LIPITOR) 40 MG tablet, Take 1 tablet (40 mg total) by mouth daily., Disp: 90 tablet, Rfl: 1 .  azelastine (ASTELIN) 0.1 % nasal spray, Place 2 sprays into both nostrils 2 (two) times daily. Use in each nostril as directed, Disp: 30 mL, Rfl: 12 .  fluticasone (FLONASE) 50 MCG/ACT nasal spray, Place 2 sprays into both nostrils daily., Disp: 16 g, Rfl: 6 .  ibuprofen (ADVIL,MOTRIN) 600 MG tablet, Take 1 tablet (600 mg total) by mouth every 8 (eight) hours as needed., Disp: 30 tablet, Rfl: 1 .  Icosapent Ethyl 1 g CAPS, Take 2 capsules (2 g total) by mouth 2 (two) times daily., Disp: 180 capsule, Rfl: 1 .  levocetirizine (XYZAL) 5 MG tablet, Take 1 tablet (5 mg total) by mouth every evening., Disp: 90 tablet, Rfl: 1 .  lisinopril (PRINIVIL,ZESTRIL) 10 MG  tablet, TAKE 1 TABLET(10 MG) BY MOUTH DAILY, Disp: 90 tablet, Rfl: 1 .  montelukast (SINGULAIR) 10 MG tablet, Take 1 tablet (10 mg total) by mouth at bedtime., Disp: 30 tablet, Rfl: 1 .  omeprazole (PRILOSEC) 20 MG capsule, Take 1 capsule (20 mg total) by mouth daily., Disp: 90 capsule, Rfl: 0 .  SUMAtriptan (IMITREX) 100 MG tablet, Take 1 tablet (100 mg total) by mouth every 2 (two) hours as needed for migraine. May repeat in 2 hours if headache persists or recurs. No more than 2 doses in 24 hours, Disp: 10  tablet, Rfl: 0 .  tadalafil (CIALIS) 5 MG tablet, Take by mouth., Disp: , Rfl:  .  tamsulosin (FLOMAX) 0.4 MG CAPS capsule, Take by mouth., Disp: , Rfl:  .  Testosterone (ANDROGEL PUMP) 20.25 MG/ACT (1.62%) GEL, Apply 1 application topically daily., Disp: 75 g, Rfl: 0 .  naproxen (NAPROSYN) 500 MG tablet, Take 1 tablet (500 mg total) by mouth 2 (two) times daily with a meal. (Patient not taking: Reported on 07/15/2018), Disp: 60 tablet, Rfl: 0  Allergies  Allergen Reactions  . Doxycycline Other (See Comments) and Nausea And Vomiting    I personally reviewed active problem list, medication list, allergies, notes from last encounter, lab results with the patient/caregiver today.  ROS  Ten systems reviewed and is negative except as mentioned in HPI  Objective  Virtual encounter, vitals not obtained.  Body mass index is 35.08 kg/m.  Nursing Note and Vital Signs reviewed.  Physical Exam  Constitutional: Patient appears well-developed and well-nourished. No distress.  HENT: Head: Normocephalic and atraumatic.  Neck: Normal range of motion. Pulmonary/Chest: Effort normal. No respiratory distress. Speaking in complete sentences Neurological: Pt is alert and oriented to person, place, and time. Coordination, speech are normal.  Psychiatric: Patient has a normal mood and affect. behavior is normal. Judgment and thought content normal.  No results found for this or any previous visit (from the past 72 hour(s)).  Assessment & Plan  1. Weight gain - Discussed importance of 150 minutes of physical activity weekly, eat two servings of fish weekly, eat one serving of tree nuts ( cashews, pistachios, pecans, almonds.Marland Kitchen) every other day, eat 6 servings of fruit/vegetables daily and drink plenty of water and avoid sweet beverages.  - We will start with labs, he'll come in tomorrow to have these performed.  He does have HTN and intermittent CP, so we will not consider stimulant therapy.  He is a  current smoker, and I feel that Wellbutrin or Contrave may be adequate options for him.  Also consider GLP-1 therapy or Metformin if insulin resistance is present. - TSH - CBC with Differential/Platelet - COMPLETE METABOLIC PANEL WITH GFR - Hemoglobin A1c - Insulin, random  2. Polyphagia - TSH - CBC with Differential/Platelet - COMPLETE METABOLIC PANEL WITH GFR - Hemoglobin A1c - Insulin, random  3. Essential hypertension - CBC with Differential/Platelet - COMPLETE METABOLIC PANEL WITH GFR  4. Mixed hyperlipidemia - Lipid panel  - I discussed the assessment and treatment plan with the patient. The patient was provided an opportunity to ask questions and all were answered. The patient agreed with the plan and demonstrated an understanding of the instructions.  I provided 16 minutes of non-face-to-face time during this encounter.  Hubbard Hartshorn, FNP

## 2018-08-13 ENCOUNTER — Encounter: Payer: Self-pay | Admitting: Family Medicine

## 2018-08-13 ENCOUNTER — Telehealth: Payer: Self-pay

## 2018-08-13 DIAGNOSIS — R300 Dysuria: Secondary | ICD-10-CM

## 2018-08-13 NOTE — Telephone Encounter (Signed)
He also wrote you back in [place] message, back pain smelly odor, dark urine and frequency

## 2018-08-13 NOTE — Telephone Encounter (Signed)
Urine culture is ordered, however I need to know what symptoms he is having.  Men do not typically get UTI's on a routine basis.

## 2018-08-13 NOTE — Telephone Encounter (Signed)
Copied from [place] [id number]. Topic: General - Other >> Aug 13, 2018  9:29 AM Oneta Rack wrote: Relation to pt: self  Call back [place]  Reason for call:  Patient coming into the office for labs only tomorrow, patient requesting urine orders due to possible UTI, please advise patient when order is placed.

## 2018-08-14 ENCOUNTER — Other Ambulatory Visit (INDEPENDENT_AMBULATORY_CARE_PROVIDER_SITE_OTHER): Payer: BC Managed Care – PPO

## 2018-08-14 DIAGNOSIS — R635 Abnormal weight gain: Secondary | ICD-10-CM | POA: Diagnosis not present

## 2018-08-14 DIAGNOSIS — R632 Polyphagia: Secondary | ICD-10-CM | POA: Diagnosis not present

## 2018-08-14 DIAGNOSIS — I10 Essential (primary) hypertension: Secondary | ICD-10-CM | POA: Diagnosis not present

## 2018-08-14 DIAGNOSIS — R300 Dysuria: Secondary | ICD-10-CM

## 2018-08-14 DIAGNOSIS — E782 Mixed hyperlipidemia: Secondary | ICD-10-CM | POA: Diagnosis not present

## 2018-08-14 LAB — POCT URINALYSIS DIPSTICK
Bilirubin, UA: NEGATIVE
Blood, UA: NEGATIVE
Glucose, UA: NEGATIVE
Ketones, UA: NEGATIVE
Leukocytes, UA: NEGATIVE
Nitrite, UA: NEGATIVE
Odor: NORMAL
Protein, UA: NEGATIVE
Spec Grav, UA: 1.015 (ref 1.010–1.025)
Urobilinogen, UA: 0.2 E.U./dL
pH, UA: 5.5 (ref 5.0–8.0)

## 2018-08-16 LAB — URINE CULTURE
MICRO NUMBER:: 589558
SPECIMEN QUALITY:: ADEQUATE

## 2018-08-17 ENCOUNTER — Other Ambulatory Visit: Payer: Self-pay | Admitting: Family Medicine

## 2018-08-17 ENCOUNTER — Encounter: Payer: Self-pay | Admitting: Family Medicine

## 2018-08-17 DIAGNOSIS — R632 Polyphagia: Secondary | ICD-10-CM

## 2018-08-17 DIAGNOSIS — R635 Abnormal weight gain: Secondary | ICD-10-CM

## 2018-08-17 DIAGNOSIS — E88819 Insulin resistance, unspecified: Secondary | ICD-10-CM | POA: Insufficient documentation

## 2018-08-17 DIAGNOSIS — E8881 Metabolic syndrome: Secondary | ICD-10-CM

## 2018-08-17 DIAGNOSIS — N3000 Acute cystitis without hematuria: Secondary | ICD-10-CM

## 2018-08-17 LAB — LIPID PANEL
Cholesterol: 250 mg/dL — ABNORMAL HIGH (ref <200)
HDL: 35 mg/dL — ABNORMAL LOW (ref >=40)
LDL Cholesterol (Calc): 158 mg/dL (calc) — ABNORMAL HIGH
Non-HDL Cholesterol (Calc): 215 mg/dL (calc) — ABNORMAL HIGH (ref <130)
Total CHOL/HDL Ratio: 7.1 (calc) — ABNORMAL HIGH (ref <5.0)
Triglycerides: 370 mg/dL — ABNORMAL HIGH (ref <150)

## 2018-08-17 LAB — COMPLETE METABOLIC PANEL WITH GFR
AG Ratio: 1.7 (calc) (ref 1.0–2.5)
ALT: 14 U/L (ref 9–46)
AST: 24 U/L (ref 10–40)
Albumin: 4.4 g/dL (ref 3.6–5.1)
Alkaline phosphatase (APISO): 70 U/L (ref 36–130)
BUN/Creatinine Ratio: 16 (calc) (ref 6–22)
BUN: 23 mg/dL (ref 7–25)
CO2: 24 mmol/L (ref 20–32)
Calcium: 9.4 mg/dL (ref 8.6–10.3)
Chloride: 105 mmol/L (ref 98–110)
Creat: 1.41 mg/dL — ABNORMAL HIGH (ref 0.60–1.35)
GFR, Est African American: 69 mL/min/{1.73_m2} (ref >=60)
GFR, Est Non African American: 60 mL/min/{1.73_m2} (ref >=60)
Globulin: 2.6 g/dL (calc) (ref 1.9–3.7)
Glucose, Bld: 102 mg/dL — ABNORMAL HIGH (ref 65–99)
Potassium: 4.4 mmol/L (ref 3.5–5.3)
Sodium: 139 mmol/L (ref 135–146)
Total Bilirubin: 0.3 mg/dL (ref 0.2–1.2)
Total Protein: 7 g/dL (ref 6.1–8.1)

## 2018-08-17 LAB — CBC WITH DIFFERENTIAL/PLATELET
Absolute Monocytes: 825 cells/uL (ref 200–950)
Basophils Absolute: 55 cells/uL (ref 0–200)
Basophils Relative: 0.5 %
Eosinophils Absolute: 132 cells/uL (ref 15–500)
Eosinophils Relative: 1.2 %
HCT: 47.6 % (ref 38.5–50.0)
Hemoglobin: 16.3 g/dL (ref 13.2–17.1)
Lymphs Abs: 3806 cells/uL (ref 850–3900)
MCH: 30.3 pg (ref 27.0–33.0)
MCHC: 34.2 g/dL (ref 32.0–36.0)
MCV: 88.5 fL (ref 80.0–100.0)
MPV: 10.3 fL (ref 7.5–12.5)
Monocytes Relative: 7.5 %
Neutro Abs: 6182 cells/uL (ref 1500–7800)
Neutrophils Relative %: 56.2 %
Platelets: 283 10*3/uL (ref 140–400)
RBC: 5.38 10*6/uL (ref 4.20–5.80)
RDW: 13.8 % (ref 11.0–15.0)
Total Lymphocyte: 34.6 %
WBC: 11 10*3/uL — ABNORMAL HIGH (ref 3.8–10.8)

## 2018-08-17 LAB — HEMOGLOBIN A1C
Hgb A1c MFr Bld: 5.3 % of total Hgb (ref <5.7)
Mean Plasma Glucose: 105 (calc)
eAG (mmol/L): 5.8 (calc)

## 2018-08-17 LAB — INSULIN, RANDOM: Insulin: 32.4 u[IU]/mL — ABNORMAL HIGH

## 2018-08-17 LAB — TSH: TSH: 1.03 mIU/L (ref 0.40–4.50)

## 2018-08-17 MED ORDER — SULFAMETHOXAZOLE-TRIMETHOPRIM 800-160 MG PO TABS: 1.0000 | ORAL_TABLET | Freq: Two times a day (BID) | ORAL | 0 refills | Status: DC

## 2018-08-18 MED ORDER — OZEMPIC (0.25 OR 0.5 MG/DOSE) 2 MG/1.5ML ~~LOC~~ SOPN: PEN_INJECTOR | SUBCUTANEOUS | 3 refills | Status: DC

## 2018-08-18 MED ORDER — NOVOFINE 32G X 6 MM MISC: 1.0000 | 0 refills | Status: DC

## 2018-08-24 ENCOUNTER — Other Ambulatory Visit: Payer: Self-pay

## 2018-08-24 ENCOUNTER — Ambulatory Visit: Payer: BC Managed Care – PPO | Admitting: Nurse Practitioner

## 2018-08-24 ENCOUNTER — Encounter: Payer: Self-pay | Admitting: Nurse Practitioner

## 2018-08-24 VITALS — BP 118/70 | HR 83 | Temp 97.5°F | Resp 14 | Ht 71.0 in | Wt 237.0 lb

## 2018-08-24 DIAGNOSIS — Z716 Tobacco abuse counseling: Secondary | ICD-10-CM

## 2018-08-24 DIAGNOSIS — R309 Painful micturition, unspecified: Secondary | ICD-10-CM

## 2018-08-24 DIAGNOSIS — E669 Obesity, unspecified: Secondary | ICD-10-CM

## 2018-08-24 DIAGNOSIS — N138 Other obstructive and reflux uropathy: Secondary | ICD-10-CM

## 2018-08-24 DIAGNOSIS — R350 Frequency of micturition: Secondary | ICD-10-CM | POA: Diagnosis not present

## 2018-08-24 DIAGNOSIS — N401 Enlarged prostate with lower urinary tract symptoms: Secondary | ICD-10-CM

## 2018-08-24 DIAGNOSIS — Z87438 Personal history of other diseases of male genital organs: Secondary | ICD-10-CM

## 2018-08-24 DIAGNOSIS — E66811 Obesity, class 1: Secondary | ICD-10-CM

## 2018-08-24 DIAGNOSIS — Z720 Tobacco use: Secondary | ICD-10-CM

## 2018-08-24 LAB — POCT URINALYSIS DIPSTICK
Bilirubin, UA: NEGATIVE
Blood, UA: NEGATIVE
Glucose, UA: NEGATIVE
Ketones, UA: NEGATIVE
Leukocytes, UA: NEGATIVE
Nitrite, UA: NEGATIVE
Protein, UA: NEGATIVE
Spec Grav, UA: 1.01 (ref 1.010–1.025)
Urobilinogen, UA: NEGATIVE E.U./dL — AB
pH, UA: 5 (ref 5.0–8.0)

## 2018-08-24 MED ORDER — CHANTIX STARTING MONTH PAK 0.5 MG X 11 & 1 MG X 42 PO TABS: ORAL_TABLET | ORAL | 0 refills | Status: DC

## 2018-08-24 MED ORDER — CIPROFLOXACIN HCL 500 MG PO TABS: 500.0000 mg | ORAL_TABLET | Freq: Two times a day (BID) | ORAL | 0 refills | Status: DC

## 2018-08-24 NOTE — Patient Instructions (Addendum)
- Increase flomax to 0.8mg  daily - Urine is sent off to microscopic- will treat based off these results - Drink at least 70 ounces of water, cut out caffeine - Please hold on taking cipro until UA results come back but if you have worsening pain with urination, lower back pain, fevers or chills please start taking full dose as prescribed.  - I sent chantix over- pick quit date and start chantix one week beforehand.   Coping with Quitting Smoking  Quitting smoking is a physical and mental challenge. You will face cravings, withdrawal symptoms, and temptation. Before quitting, work with your health care provider to make a plan that can help you cope. Preparation can help you quit and keep you from giving in. How can I cope with cravings? Cravings usually last for 5-10 minutes. If you get through it, the craving will pass. Consider taking the following actions to help you cope with cravings:  Keep your mouth busy: ? Chew sugar-free gum. ? Suck on hard candies or a straw. ? Brush your teeth.  Keep your hands and body busy: ? Immediately change to a different activity when you feel a craving. ? Squeeze or play with a ball. ? Do an activity or a hobby, like making bead jewelry, practicing needlepoint, or working with wood. ? Mix up your normal routine. ? Take a short exercise break. Go for a quick walk or run up and down stairs. ? Spend time in public places where smoking is not allowed.  Focus on doing something kind or helpful for someone else.  Call a friend or family member to talk during a craving.  Join a support group.  Call a quit line, such as 1-800-QUIT-NOW.  Talk with your health care provider about medicines that might help you cope with cravings and make quitting easier for you. How can I deal with withdrawal symptoms? Your body may experience negative effects as it tries to get used to not having nicotine in the system. These effects are called withdrawal symptoms. They may  include:  Feeling hungrier than normal.  Trouble concentrating.  Irritability.  Trouble sleeping.  Feeling depressed.  Restlessness and agitation.  Craving a cigarette. To manage withdrawal symptoms:  Avoid places, people, and activities that trigger your cravings.  Remember why you want to quit.  Get plenty of sleep.  Avoid coffee and other caffeinated drinks. These may worsen some of your symptoms. How can I handle social situations? Social situations can be difficult when you are quitting smoking, especially in the first few weeks. To manage this, you can:  Avoid parties, bars, and other social situations where people might be smoking.  Avoid alcohol.  Leave right away if you have the urge to smoke.  Explain to your family and friends that you are quitting smoking. Ask for understanding and support.  Plan activities with friends or family where smoking is not an option. What are some ways I can cope with stress? Wanting to smoke may cause stress, and stress can make you want to smoke. Find ways to manage your stress. Relaxation techniques can help. For example:  Breathe slowly and deeply, in through your nose and out through your mouth.  Listen to soothing, relaxing music.  Talk with a family member or friend about your stress.  Light a candle.  Soak in a bath or take a shower.  Think about a peaceful place. What are some ways I can prevent weight gain? Be aware that many people gain  weight after they quit smoking. However, not everyone does. To keep from gaining weight, have a plan in place before you quit and stick to the plan after you quit. Your plan should include:  Having healthy snacks. When you have a craving, it may help to: ? Eat plain popcorn, crunchy carrots, celery, or other cut vegetables. ? Chew sugar-free gum.  Changing how you eat: ? Eat small portion sizes at meals. ? Eat 4-6 small meals throughout the day instead of 1-2 large meals a  day. ? Be mindful when you eat. Do not watch television or do other things that might distract you as you eat.  Exercising regularly: ? Make time to exercise each day. If you do not have time for a long workout, do short bouts of exercise for 5-10 minutes several times a day. ? Do some form of strengthening exercise, like weight lifting, and some form of aerobic exercise, like running or swimming.  Drinking plenty of water or other low-calorie or no-calorie drinks. Drink 6-8 glasses of water daily, or as much as instructed by your health care provider. Summary  Quitting smoking is a physical and mental challenge. You will face cravings, withdrawal symptoms, and temptation to smoke again. Preparation can help you as you go through these challenges.  You can cope with cravings by keeping your mouth busy (such as by chewing gum), keeping your body and hands busy, and making calls to family, friends, or a helpline for people who want to quit smoking.  You can cope with withdrawal symptoms by avoiding places where people smoke, avoiding drinks with caffeine, and getting plenty of rest.  Ask your health care provider about the different ways to prevent weight gain, avoid stress, and handle social situations. This information is not intended to replace advice given to you by your health care provider. Make sure you discuss any questions you have with your health care provider. Document Released: 02/09/2016 Document Revised: 01/24/2017 Document Reviewed: 02/09/2016 Elsevier Patient Education  2020 [employer].

## 2018-08-24 NOTE — Progress Notes (Signed)
Name: Jeffery [name]   MRN: [id number]    DOB: September 07, 1970   Date:08/24/2018       Progress Note  Subjective  Chief Complaint  Chief Complaint  Patient presents with  . Urinary Tract Infection    Follow up- frequency, burning, pain    HPI  Patient was treated with 7 day course of bactrim for UTI culture showed E.coli on 08/14/2018. States he only took antibiotics 4 days. States he stopped taking it because he felt nauseated and had fatigue. Once he stopped it he felt better. Patient endorse urinary frequency and mild dysuria, hesitancy, and lower back pain. States dysuria and lower back pain have improved some. Patient states symptoms started last week and have continued. He stopped energy drinks and now drinks only 2 coffee day- and 4 bottles of water. He has had prostatitis before that required 6 weeks of Levaquin for treatment a few years ago.  Sees [facility] urologist routinely- earliest appointment he can get is 09/10/2018 He is taking 5mg  of cialis and 0.4mg  of flomax daily.  Last PSA was 0.75 on 07/24/2018.  Patient is smoking 1ppd, states he knows he needs to quit to take better care of his health. Was prescribed chantix before but never started taking it, but would like a new script to start taking it. His wife smokes- but only recreationally- when she drinks.   PHQ2/9: Depression screen [facility] 2/9 08/24/2018 08/10/2018 07/15/2018 06/09/2018 03/16/2018  Decreased Interest 0 0 0 0 0  Down, Depressed, Hopeless 0 0 0 0 0  PHQ - 2 Score 0 0 0 0 0  Altered sleeping 0 0 0 0 -  Tired, decreased energy 0 0 0 0 -  Change in appetite 0 0 0 0 -  Feeling bad or failure about yourself  0 0 0 0 -  Trouble concentrating 0 0 0 0 -  Moving slowly or fidgety/restless 0 0 0 0 -  Suicidal thoughts 0 0 0 0 -  PHQ-9 Score 0 0 0 0 -  Difficult doing work/chores Not difficult at all Not difficult at all Not difficult at all Not difficult at all -     PHQ reviewed. Positive  Patient Active Problem List   Diagnosis Date Noted  . Insulin resistance 08/17/2018  . Metabolic syndrome 83/38/2505  . Gastroesophageal reflux disease without esophagitis 03/17/2018  . Other chest pain 03/16/2018  . Mixed hyperlipidemia 03/16/2018  . Essential hypertension 12/22/2017  . Tobacco abuse counseling 10/10/2017  . Biceps tendon rupture, right, subsequent encounter 03/20/2017  . Hypogonadism male 09/19/2016  . Family history of type 2 diabetes mellitus in mother 04/05/2016  . Urinary incontinence 03/18/2016  . 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 Dyslipidemia 03/10/2015  . 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 Opioid type dependence, abuse ([place]) 03/10/2015  . Adiposity 03/10/2015  . Encounter for annual physical exam 12/23/2014  . History of nasal septoplasty 11/23/2014  . Chronic prostatitis 10/21/2014  . Benign prostatic hyperplasia 10/21/2014  . Overweight (BMI 25.0-29.9) 10/21/2014  . Umbilical hernia 39/76/7341    Past Medical History:  Diagnosis Date  . Dental crowns present    sides and back  . Dyslipidemia   . Enlarged prostate   . Epididymitis   . Headache    sinus  . Hypotestosteronism   . Lower urinary tract symptoms (LUTS) 06/14/2016  . Nodular prostate with lower urinary tract symptoms   . Obesity   . Testosterone deficiency   . Umbilical  hernia   . Umbilical hernia without obstruction or gangrene     Past Surgical History:  Procedure Laterality Date  . HERNIA REPAIR  6/38/75   umbilical hernia  . NASAL SEPTOPLASTY W/ TURBINOPLASTY Bilateral 11/23/2014   Procedure: NASAL SEPTOPLASTY WITH TURBINATE REDUCTION;  Surgeon: Carloyn Manner, MD;  Location: [place];  Service: ENT;  Laterality: Bilateral;  . PROSTATE BIOPSY  2015  . ROOT CANAL    . WRIST FRACTURE SURGERY Right     Social History   Tobacco Use  . Smoking status: Current Every Day Smoker    Packs/day: 1.00    Years: 25.00    Pack years: 25.00    Types:  Cigarettes, E-cigarettes  . Smokeless tobacco: Never Used  . Tobacco comment: Plans to stop in March  Substance Use Topics  . Alcohol use: No    Alcohol/week: 0.0 standard drinks     Current Outpatient Medications:  .  atorvastatin (LIPITOR) 40 MG tablet, Take 1 tablet (40 mg total) by mouth daily., Disp: 90 tablet, Rfl: 1 .  azelastine (ASTELIN) 0.1 % nasal spray, Place 2 sprays into both nostrils 2 (two) times daily. Use in each nostril as directed, Disp: 30 mL, Rfl: 12 .  fluticasone (FLONASE) 50 MCG/ACT nasal spray, Place 2 sprays into both nostrils daily., Disp: 16 g, Rfl: 6 .  ibuprofen (ADVIL,MOTRIN) 600 MG tablet, Take 1 tablet (600 mg total) by mouth every 8 (eight) hours as needed., Disp: 30 tablet, Rfl: 1 .  Icosapent Ethyl 1 g CAPS, Take 2 capsules (2 g total) by mouth 2 (two) times daily., Disp: 180 capsule, Rfl: 1 .  levocetirizine (XYZAL) 5 MG tablet, Take 1 tablet (5 mg total) by mouth every evening., Disp: 90 tablet, Rfl: 1 .  lisinopril (PRINIVIL,ZESTRIL) 10 MG tablet, TAKE 1 TABLET(10 MG) BY MOUTH DAILY, Disp: 90 tablet, Rfl: 1 .  montelukast (SINGULAIR) 10 MG tablet, Take 1 tablet (10 mg total) by mouth at bedtime., Disp: 30 tablet, Rfl: 1 .  naproxen (NAPROSYN) 500 MG tablet, Take 1 tablet (500 mg total) by mouth 2 (two) times daily with a meal., Disp: 60 tablet, Rfl: 0 .  Semaglutide,0.25 or 0.5MG /DOS, (OZEMPIC, 0.25 OR 0.5 MG/DOSE,) 2 MG/1.5ML SOPN, Inject 0.25 mg into the skin once a week for 28 days, THEN 0.5 mg once a week., Disp: 3 pen, Rfl: 3 .  SUMAtriptan (IMITREX) 100 MG tablet, Take 1 tablet (100 mg total) by mouth every 2 (two) hours as needed for migraine. May repeat in 2 hours if headache persists or recurs. No more than 2 doses in 24 hours, Disp: 10 tablet, Rfl: 0 .  tadalafil (CIALIS) 5 MG tablet, Take by mouth., Disp: , Rfl:  .  tamsulosin (FLOMAX) 0.4 MG CAPS capsule, Take by mouth., Disp: , Rfl:  .  Testosterone (ANDROGEL PUMP) 20.25 MG/ACT (1.62%) GEL,  Apply 1 application topically daily., Disp: 75 g, Rfl: 0 .  ciprofloxacin (CIPRO) 500 MG tablet, Take 1 tablet (500 mg total) by mouth 2 (two) times daily., Disp: 14 tablet, Rfl: 0 .  Insulin Pen Needle (NOVOFINE) 32G X 6 MM MISC, 1 each by Does not apply route once a week., Disp: 100 each, Rfl: 0 .  omeprazole (PRILOSEC) 20 MG capsule, Take 1 capsule (20 mg total) by mouth daily., Disp: 90 capsule, Rfl: 0 .  varenicline (CHANTIX STARTING MONTH PAK) 0.5 MG X 11 & 1 MG X 42 tablet, Take one 0.5 mg tablet by mouth once daily for  3 days, then increase to one 0.5 mg tablet twice daily for 4 days, then increase to one 1 mg tablet twice daily., Disp: 53 tablet, Rfl: 0  Allergies  Allergen Reactions  . Sulfa Antibiotics Nausea Only  . Doxycycline Other (See Comments) and Nausea And Vomiting    ROS   No other specific complaints in a complete review of systems (except as listed in HPI above).  Objective  Vitals:   08/24/18 0825  BP: 118/70  Pulse: 83  Resp: 14  Temp: (!) 97.5 F (36.4 C)  TempSrc: Tympanic  SpO2: 97%  Weight: 237 lb (107.5 kg)  Height: 5\' 11"  (1.803 m)    Body mass index is 33.05 kg/m.  Nursing Note and Vital Signs reviewed.  Physical Exam Constitutional:      Appearance: Normal appearance.  HENT:     Head: Normocephalic and atraumatic.  Eyes:     Conjunctiva/sclera: Conjunctivae normal.  Cardiovascular:     Rate and Rhythm: Normal rate.     Pulses: Normal pulses.  Pulmonary:     Effort: Pulmonary effort is normal.  Abdominal:     Tenderness: There is no right CVA tenderness or left CVA tenderness.  Skin:    General: Skin is warm and dry.  Neurological:     General: No focal deficit present.     Mental Status: He is alert and oriented to person, place, and time.  Psychiatric:        Mood and Affect: Mood normal.        Behavior: Behavior normal.      Results for orders placed or performed in visit on 08/24/18 (from the past 48 hour(s))  POCT  urinalysis dipstick     Status: Abnormal   Collection Time: 08/24/18  8:32 AM  Result Value Ref Range   Color, UA Dark yellow    Clarity, UA clear    Glucose, UA Negative Negative   Bilirubin, UA negative    Ketones, UA negative    Spec Grav, UA 1.010 1.010 - 1.025   Blood, UA negative    pH, UA 5.0 5.0 - 8.0   Protein, UA Negative Negative   Urobilinogen, UA negative (A) 0.2 or 1.0 E.U./dL   Nitrite, UA negative    Leukocytes, UA Negative Negative   Appearance     Odor      Assessment & Plan  1. Urinary frequency Cut out caffeine and bladder irritants, increase flomax - POCT urinalysis dipstick - Urinalysis, Routine w reflex microscopic - ciprofloxacin (CIPRO) 500 MG tablet; Take 1 tablet (500 mg total) by mouth 2 (two) times daily.  Dispense: 14 tablet; Refill: 0  2. Pain with urination - azo PRN for 3 days - POCT urinalysis dipstick - Urinalysis, Routine w reflex microscopic - ciprofloxacin (CIPRO) 500 MG tablet; Take 1 tablet (500 mg total) by mouth 2 (two) times daily.  Dispense: 14 tablet; Refill: 0  3. BPH - increase flomax, follow-up with urology.   4. Tobacco use - varenicline (CHANTIX STARTING MONTH PAK) 0.5 MG X 11 & 1 MG X 42 tablet; Take one 0.5 mg tablet by mouth once daily for 3 days, then increase to one 0.5 mg tablet twice daily for 4 days, then increase to one 1 mg tablet twice daily.  Dispense: 53 tablet; Refill: 0  5. Encounter for smoking cessation counseling Spent greater than 3 minutes discussing - varenicline (CHANTIX STARTING MONTH PAK) 0.5 MG X 11 & 1 MG X 42 tablet; Take  one 0.5 mg tablet by mouth once daily for 3 days, then increase to one 0.5 mg tablet twice daily for 4 days, then increase to one 1 mg tablet twice daily.  Dispense: 53 tablet; Refill: 0  6. History of chronic prostatitis Discussed holding till UA comes back or if symptoms worsen - ciprofloxacin (CIPRO) 500 MG tablet; Take 1 tablet (500 mg total) by mouth 2 (two) times daily.   Dispense: 14 tablet; Refill: 0  7. Obesity (BMI 30.0-34.9) Taking ozempic, working on healthier eating.   -Red flags and when to present for emergency care or RTC including fever >101.68F, chest pain, shortness of breath, new/worsening/un-resolving symptoms,  reviewed with patient at time of visit. Follow up and care instructions discussed and provided in AVS.  Face-to-face time with patient was more than 25 minutes, >50% time spent counseling and coordination of care

## 2018-08-25 LAB — URINALYSIS, ROUTINE W REFLEX MICROSCOPIC
Bilirubin Urine: NEGATIVE
Glucose, UA: NEGATIVE
Hgb urine dipstick: NEGATIVE
Ketones, ur: NEGATIVE
Leukocytes,Ua: NEGATIVE
Nitrite: NEGATIVE
Protein, ur: NEGATIVE
Specific Gravity, Urine: 1.022 (ref 1.001–1.03)
pH: 6 (ref 5.0–8.0)

## 2018-09-13 ENCOUNTER — Other Ambulatory Visit: Payer: Self-pay | Admitting: Family Medicine

## 2018-09-13 DIAGNOSIS — J329 Chronic sinusitis, unspecified: Secondary | ICD-10-CM

## 2018-09-26 ENCOUNTER — Other Ambulatory Visit: Payer: Self-pay | Admitting: Family Medicine

## 2018-09-26 DIAGNOSIS — I10 Essential (primary) hypertension: Secondary | ICD-10-CM

## 2018-10-21 DIAGNOSIS — D2310 Other benign neoplasm of skin of unspecified eyelid, including canthus: Secondary | ICD-10-CM | POA: Diagnosis not present

## 2018-10-23 DIAGNOSIS — G4733 Obstructive sleep apnea (adult) (pediatric): Secondary | ICD-10-CM | POA: Diagnosis not present

## 2018-12-16 ENCOUNTER — Other Ambulatory Visit: Payer: Self-pay | Admitting: Family Medicine

## 2018-12-16 DIAGNOSIS — E782 Mixed hyperlipidemia: Secondary | ICD-10-CM

## 2018-12-23 ENCOUNTER — Other Ambulatory Visit: Payer: Self-pay

## 2018-12-23 ENCOUNTER — Ambulatory Visit (INDEPENDENT_AMBULATORY_CARE_PROVIDER_SITE_OTHER): Payer: BC Managed Care – PPO | Admitting: Family Medicine

## 2018-12-23 ENCOUNTER — Encounter: Payer: Self-pay | Admitting: Family Medicine

## 2018-12-23 DIAGNOSIS — Z716 Tobacco abuse counseling: Secondary | ICD-10-CM | POA: Diagnosis not present

## 2018-12-23 DIAGNOSIS — J0100 Acute maxillary sinusitis, unspecified: Secondary | ICD-10-CM

## 2018-12-23 DIAGNOSIS — J209 Acute bronchitis, unspecified: Secondary | ICD-10-CM | POA: Diagnosis not present

## 2018-12-23 DIAGNOSIS — Z20828 Contact with and (suspected) exposure to other viral communicable diseases: Secondary | ICD-10-CM

## 2018-12-23 DIAGNOSIS — Z20822 Contact with and (suspected) exposure to covid-19: Secondary | ICD-10-CM

## 2018-12-23 MED ORDER — LEVOCETIRIZINE DIHYDROCHLORIDE 5 MG PO TABS: 5.0000 mg | ORAL_TABLET | Freq: Every evening | ORAL | 1 refills | Status: DC

## 2018-12-23 MED ORDER — PROMETHAZINE-DM 6.25-15 MG/5ML PO SYRP: 5.0000 mL | ORAL_SOLUTION | Freq: Four times a day (QID) | ORAL | 0 refills | Status: DC | PRN

## 2018-12-23 MED ORDER — ALBUTEROL SULFATE HFA 108 (90 BASE) MCG/ACT IN AERS: 2.0000 | INHALATION_SPRAY | Freq: Four times a day (QID) | RESPIRATORY_TRACT | 0 refills | Status: DC | PRN

## 2018-12-23 MED ORDER — AMOXICILLIN-POT CLAVULANATE 875-125 MG PO TABS: 1.0000 | ORAL_TABLET | Freq: Two times a day (BID) | ORAL | 0 refills | Status: AC

## 2018-12-23 MED ORDER — BUDESONIDE-FORMOTEROL FUMARATE 80-4.5 MCG/ACT IN AERO: 2.0000 | INHALATION_SPRAY | Freq: Two times a day (BID) | RESPIRATORY_TRACT | 3 refills | Status: DC

## 2018-12-23 NOTE — Progress Notes (Signed)
Name: Jeffery [name]   MRN: [id number]    DOB: [birth year]   Date:12/23/2018       Progress Note  Subjective  Chief Complaint  Chief Complaint  Patient presents with  . URI    nasal drainage, cough,    I connected with  [name]  on 12/23/18 at  8:00 AM EDT by a video enabled telemedicine application and verified that I am speaking with the correct person using two identifiers.  I discussed the limitations of evaluation and management by telemedicine and the availability of in person appointments. The patient expressed understanding and agreed to proceed. Staff also discussed with the patient that there may be a patient responsible charge related to this service. Patient Location: Home Provider Location: Home Office Additional Individuals present: None  HPI  Pt presents with concern for URI symptoms for about 7 days.  He has significant nasal congestion, cough in the morning when he first wakes up.  He also states fatigue, coughing so hard he has vomiting a couple of times in the morning; feels like he is having some shortness of breath but no wheezing or chest pain.  No known COVID-19 exposure, however our community spread is quite high at this time.  He is currently at work - advised he must leave and quarantine until COVID test is returned. No fever, no change is taste/smell, no NVD aside from post-tussive emesis as above.  Does see [facility]. - Tried Mucinex last week; has also been taking singulair, azelastine, and flonase.   - Smokes 1-1.5ppd - no formal COPD diagnosis.    Patient Active Problem List   Diagnosis Date Noted  . Insulin resistance 08/17/2018  . Metabolic syndrome [id number]  . Gastroesophageal reflux disease without esophagitis 03/17/2018  . Other chest pain 03/16/2018  . Mixed hyperlipidemia 03/16/2018  . Essential hypertension 12/22/2017  . Tobacco abuse counseling 10/10/2017  . Biceps tendon rupture, right, subsequent encounter 03/20/2017  .  Hypogonadism male 09/19/2016  . Family history of type 2 diabetes mellitus in mother 04/05/2016  . Urinary incontinence 03/18/2016  . 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 Dyslipidemia 03/10/2015  . 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 Opioid type dependence, abuse ([place]) 03/10/2015  . Adiposity 03/10/2015  . Encounter for annual physical exam 12/23/2014  . History of nasal septoplasty 11/23/2014  . Chronic prostatitis 10/21/2014  . Overweight (BMI 25.0-29.9) 10/21/2014  . Umbilical hernia [id number]    Social History   Tobacco Use  . Smoking status: Current Every Day Smoker    Packs/day: 1.00    Years: 25.00    Pack years: 25.00    Types: Cigarettes, E-cigarettes  . Smokeless tobacco: Never Used  . Tobacco comment: Plans to stop in March  Substance Use Topics  . Alcohol use: No    Alcohol/week: 0.0 standard drinks     Current Outpatient Medications:  .  atorvastatin (LIPITOR) 40 MG tablet, TAKE 1 TABLET(40 MG) BY MOUTH DAILY, Disp: 90 tablet, Rfl: 1 .  azelastine (ASTELIN) 0.1 % nasal spray, Place 2 sprays into both nostrils 2 (two) times daily. Use in each nostril as directed, Disp: 30 mL, Rfl: 12 .  fluticasone (FLONASE) 50 MCG/ACT nasal spray, Place 2 sprays into both nostrils daily., Disp: 16 g, Rfl: 6 .  ibuprofen (ADVIL,MOTRIN) 600 MG tablet, Take 1 tablet (600 mg total) by mouth every 8 (eight) hours as needed., Disp: 30 tablet, Rfl: 1 .  Icosapent Ethyl 1 g  CAPS, Take 2 capsules (2 g total) by mouth 2 (two) times daily., Disp: 180 capsule, Rfl: 1 .  Insulin Pen Needle (NOVOFINE) 32G X 6 MM MISC, 1 each by Does not apply route once a week., Disp: 100 each, Rfl: 0 .  levocetirizine (XYZAL) 5 MG tablet, Take 1 tablet (5 mg total) by mouth every evening., Disp: 90 tablet, Rfl: 1 .  lisinopril (ZESTRIL) 10 MG tablet, TAKE 1 TABLET(10 MG) BY MOUTH DAILY, Disp: 90 tablet, Rfl: 1 .  montelukast (SINGULAIR) 10 MG tablet, TAKE 1 TABLET(10 MG)  BY MOUTH AT BEDTIME, Disp: 30 tablet, Rfl: 1 .  naproxen (NAPROSYN) 500 MG tablet, Take 1 tablet (500 mg total) by mouth 2 (two) times daily with a meal., Disp: 60 tablet, Rfl: 0 .  omeprazole (PRILOSEC) 20 MG capsule, Take 1 capsule (20 mg total) by mouth daily., Disp: 90 capsule, Rfl: 0 .  SUMAtriptan (IMITREX) 100 MG tablet, Take 1 tablet (100 mg total) by mouth every 2 (two) hours as needed for migraine. May repeat in 2 hours if headache persists or recurs. No more than 2 doses in 24 hours, Disp: 10 tablet, Rfl: 0 .  tadalafil (CIALIS) 5 MG tablet, Take by mouth., Disp: , Rfl:  .  tamsulosin (FLOMAX) 0.4 MG CAPS capsule, Take by mouth., Disp: , Rfl:  .  Testosterone (ANDROGEL PUMP) 20.25 MG/ACT (1.62%) GEL, Apply 1 application topically daily., Disp: 75 g, Rfl: 0 .  ciprofloxacin (CIPRO) 500 MG tablet, Take 1 tablet (500 mg total) by mouth 2 (two) times daily. (Patient not taking: Reported on 12/23/2018), Disp: 14 tablet, Rfl: 0 .  varenicline (CHANTIX STARTING MONTH PAK) 0.5 MG X 11 & 1 MG X 42 tablet, Take one 0.5 mg tablet by mouth once daily for 3 days, then increase to one 0.5 mg tablet twice daily for 4 days, then increase to one 1 mg tablet twice daily. (Patient not taking: Reported on 12/23/2018), Disp: 53 tablet, Rfl: 0  Allergies  Allergen Reactions  . Sulfa Antibiotics Nausea Only  . Doxycycline Other (See Comments) and Nausea And Vomiting    I personally reviewed active problem list, medication list, allergies, notes from last encounter, lab results with the patient/caregiver today.  ROS  Ten systems reviewed and is negative except as mentioned in HPI  Objective  Virtual encounter, vitals not obtained.  There is no height or weight on file to calculate BMI.  Nursing Note and Vital Signs reviewed.  Physical Exam  Constitutional: Patient appears well-developed and well-nourished. No distress.  HENT: Head: Normocephalic and atraumatic.  Voice quality is nasal and a bit  hoarse; he endorses sinus tenderness to bilateral maxillary sinuses; denies palpable lymph node enlargement. Neck: Normal range of motion. Pulmonary/Chest: Effort normal. No respiratory distress. Speaking in complete sentences. Neurological: Pt is alert and oriented to person, place, and time. Coordination, speech and gait are normal.  Psychiatric: Patient has a normal mood and affect. behavior is normal. Judgment and thought content normal.  No results found for this or any previous visit (from the past 72 hour(s)).  Assessment & Plan  1. Acute non-recurrent maxillary sinusitis - levocetirizine (XYZAL) 5 MG tablet; Take 1 tablet (5 mg total) by mouth every evening.  Dispense: 90 tablet; Refill: 1 - amoxicillin-clavulanate (AUGMENTIN) 875-125 MG tablet; Take 1 tablet by mouth 2 (two) times daily for 10 days.  Dispense: 20 tablet; Refill: 0 - Novel Coronavirus, NAA ([facility])  2. Bronchitis with bronchospasm - budesonide-formoterol (SYMBICORT) 80-4.5 MCG/ACT inhaler;  Inhale 2 puffs into the lungs 2 (two) times daily.  Dispense: 1 Inhaler; Refill: 3 - albuterol (VENTOLIN HFA) 108 (90 Base) MCG/ACT inhaler; Inhale 2 puffs into the lungs every 6 (six) hours as needed for wheezing or shortness of breath.  Dispense: 18 g; Refill: 0 - promethazine-dextromethorphan (PROMETHAZINE-DM) 6.25-15 MG/5ML syrup; Take 5 mLs by mouth 4 (four) times daily as needed.  Dispense: 118 mL; Refill: 0 - Novel Coronavirus, NAA ([facility])  3. Suspected COVID-19 virus infection - Advised needs to leave work and quarantine until results are returned; verbalized understanding. - Novel Coronavirus, NAA ([facility])  4. Tobacco abuse counseling - Not ready to quit.   -Red flags and when to present for emergency care or RTC including fever >101.69F, chest pain, shortness of breath, new/worsening/un-resolving symptoms, reviewed with patient at time of visit. Follow up and care instructions discussed and provided in AVS. - I  discussed the assessment and treatment plan with the patient. The patient was provided an opportunity to ask questions and all were answered. The patient agreed with the plan and demonstrated an understanding of the instructions.  I provided 15 minutes of non-face-to-face time during this encounter.  Hubbard Hartshorn, FNP

## 2018-12-25 DIAGNOSIS — Z20828 Contact with and (suspected) exposure to other viral communicable diseases: Secondary | ICD-10-CM | POA: Diagnosis not present

## 2019-01-14 ENCOUNTER — Other Ambulatory Visit: Payer: Self-pay | Admitting: Family Medicine

## 2019-01-14 DIAGNOSIS — J209 Acute bronchitis, unspecified: Secondary | ICD-10-CM

## 2019-01-14 NOTE — Telephone Encounter (Signed)
Requested medication (s) are due for refill today: no  Requested medication (s) are on the active medication list: yes  Last refill:  12/23/2018  Future visit scheduled: yes  Notes to clinic: review for refill One inhaler should last at least one month    Requested Prescriptions  Pending Prescriptions Disp Refills   VENTOLIN HFA 108 (90 Base) MCG/ACT inhaler [Pharmacy Med Name: VENTOLIN HFA INH W/DOS CTR 200PUFFS] 18 g 0    Sig: INHALE 2 PUFFS INTO THE LUNGS EVERY 6 HOURS AS NEEDED FOR WHEEZING OR SHORTNESS OF BREATH     Pulmonology:  Beta Agonists Failed - 01/14/2019  3:17 AM      Failed - One inhaler should last at least one month. If the patient is requesting refills earlier, contact the patient to check for uncontrolled symptoms.      Passed - Valid encounter within last 12 months    Recent Outpatient Visits          3 weeks ago Bronchitis with bronchospasm   [place], FNP   4 months ago Urinary frequency   [place], NP   5 months ago Weight gain   [place], FNP   6 months ago Chronic recurrent sinusitis   [place], FNP   7 months ago Acute non-recurrent maxillary sinusitis   [place], FNP      Future Appointments            In 2 weeks Hubbard Hartshorn, [facility], [facility]

## 2019-01-22 DIAGNOSIS — G4733 Obstructive sleep apnea (adult) (pediatric): Secondary | ICD-10-CM | POA: Diagnosis not present

## 2019-01-28 ENCOUNTER — Ambulatory Visit: Payer: BC Managed Care – PPO | Admitting: Family Medicine

## 2019-02-02 DIAGNOSIS — R300 Dysuria: Secondary | ICD-10-CM | POA: Diagnosis not present

## 2019-02-02 DIAGNOSIS — R399 Unspecified symptoms and signs involving the genitourinary system: Secondary | ICD-10-CM | POA: Diagnosis not present

## 2019-02-08 DIAGNOSIS — Z20828 Contact with and (suspected) exposure to other viral communicable diseases: Secondary | ICD-10-CM | POA: Diagnosis not present

## 2019-03-22 DIAGNOSIS — N401 Enlarged prostate with lower urinary tract symptoms: Secondary | ICD-10-CM | POA: Diagnosis not present

## 2019-03-22 DIAGNOSIS — R7989 Other specified abnormal findings of blood chemistry: Secondary | ICD-10-CM | POA: Diagnosis not present

## 2019-03-22 DIAGNOSIS — N138 Other obstructive and reflux uropathy: Secondary | ICD-10-CM | POA: Diagnosis not present

## 2019-05-04 ENCOUNTER — Encounter: Payer: Self-pay | Admitting: Family Medicine

## 2019-05-11 ENCOUNTER — Other Ambulatory Visit: Payer: Self-pay | Admitting: Family Medicine

## 2019-05-11 DIAGNOSIS — I10 Essential (primary) hypertension: Secondary | ICD-10-CM

## 2019-05-11 NOTE — Telephone Encounter (Signed)
Requested Prescriptions  Pending Prescriptions Disp Refills  . lisinopril (ZESTRIL) 10 MG tablet [Pharmacy Med Name: LISINOPRIL 10MG  TABLETS] 30 tablet 0    Sig: TAKE 1 TABLET(10 MG) BY MOUTH DAILY     Cardiovascular:  ACE Inhibitors Failed - 05/11/2019  3:20 AM      Failed - Cr in normal range and within 180 days    Creat  Date Value Ref Range Status  08/14/2018 1.41 (H) 0.60 - 1.35 mg/dL Final         Failed - K in normal range and within 180 days    Potassium  Date Value Ref Range Status  08/14/2018 4.4 3.5 - 5.3 mmol/L Final         Failed - Valid encounter within last 6 months    Recent Outpatient Visits          4 months ago Bronchitis with bronchospasm   [place], FNP   8 months ago Urinary frequency   [place], NP   9 months ago Weight gain   [place], [place]   10 months ago Chronic recurrent sinusitis   [place], [place]   11 months ago Acute non-recurrent maxillary sinusitis   [place], [place]      Future Appointments            In 1 week Towanda Malkin, MD [facility], [place] - Patient is not pregnant      Passed - Last BP in normal range    BP Readings from Last 1 Encounters:  08/24/18 118/70         Has an appointment in one week.

## 2019-05-21 ENCOUNTER — Encounter: Payer: BC Managed Care – PPO | Admitting: Internal Medicine

## 2019-05-21 ENCOUNTER — Telehealth: Payer: Self-pay

## 2019-05-21 NOTE — Telephone Encounter (Signed)
Pt notified of the prior authorization for Ozempic. He is still interested in taking this medication. He has an upcoming appointment in May.

## 2019-05-21 NOTE — Telephone Encounter (Signed)
Received a Prior Authorization from [employer] on patient's Ozempic. Calling to see if he still takes it since the prescription has expired.

## 2019-07-02 DIAGNOSIS — G4733 Obstructive sleep apnea (adult) (pediatric): Secondary | ICD-10-CM | POA: Diagnosis not present

## 2019-07-06 ENCOUNTER — Encounter: Payer: Self-pay | Admitting: Internal Medicine

## 2019-07-20 ENCOUNTER — Telehealth: Payer: Self-pay | Admitting: Internal Medicine

## 2019-07-20 DIAGNOSIS — I10 Essential (primary) hypertension: Secondary | ICD-10-CM

## 2019-07-21 ENCOUNTER — Other Ambulatory Visit: Payer: Self-pay

## 2019-07-21 DIAGNOSIS — I10 Essential (primary) hypertension: Secondary | ICD-10-CM

## 2019-07-21 MED ORDER — LISINOPRIL 10 MG PO TABS: ORAL_TABLET | ORAL | 0 refills | Status: DC

## 2019-07-21 NOTE — Telephone Encounter (Signed)
Patient called in checking on status of this medication refill. Patient was given an emergency refill of 5 days only from pharmacy. Please advise.

## 2019-07-29 NOTE — Progress Notes (Deleted)
Patient is a 49 year old male patient of Loanne Drilling today for annual physical evaluation and follow-up  HTN:  Med regimen -lisinopril-10 mg daily -does take medications as prescribed  Does not check blood pressures at home BP Readings from Last 3 Encounters:  08/24/18 118/70  03/16/18 130/76  01/09/18 136/84   Denies any recent chest pains, palpitations, shortness of breath, increased lower extremity swelling, increased headaches, vision changes.  Obesity Wt Readings from Last 3 Encounters:  08/24/18 237 lb (107.5 kg)  08/10/18 248 lb (112.5 kg)  03/16/18 239 lb 3.2 oz (108.5 kg)    Diet-tries to eat a healthy diet and modify calories  Exercise-no regular exercise regimen Has been on Ozempic in the past to help with weight loss  Sleep Apnea -  Utilizes CPAP  Tobacco Abuse: 1ppd for >25 years.  Denies shortness of breath or wheezing.  Not ready to quit at this time.   HLD:  Lab Results  Component Value Date   CHOL 250 (H) 08/14/2018   HDL 35 (L) 08/14/2018   LDLCALC 158 (H) 08/14/2018   TRIG 370 (H) 08/14/2018   CHOLHDL 7.1 (H) 08/14/2018   Med regimen - taking vascepa and lipitor.   States he is compliant with the medication  denies myalgias   GERD: Med regimen -omeprazole  Denies any dark/black stools, no chronic abdominal pains, no vomiting  BPH/hypogonadism: Seeing urology with [facility] for BPH, last visit May 2020 with the following noted: Patient presents for follow-up of LUTS and low testosterone. He currently uses AndroGel, 5 pumps daily. ED is responsive to daily Cialis. He is currently satisfied with his voiding and previously underwent TUMT. He reports he underwent biopsy prior to TUMT, benign.   Takes flomax PRN only.  He is using androgel daily to help with fatigue.  Prostate cancer screening PSAs have been in the 0.7 - 0.81 range since 2018, with the last checked January 2021 (in the [facility] system labs)  Colon cancer screening - COLONOSCOPY  09/12/2016  FH of CC (Mother):   Low Dose CT for Lung Cancer Screening:   Shared Decision Making:    Past smoker status reviewed The patient meets USPTF recommendations for lung cancer screening with low dose CT in persons age 66 - 58 yrs with a 20 pack year history who are currently smoking or have quit in the past 15 yrs.  We have discussed the risks and benefits of screening, including but not limited to the following. Lung cancer screening might detect about one half of lung cancer at an early, curative stage. For the average screened person, the risk of dying from lung cancer will decrease from 1.7% to 1.4% with screening. About 1 in 4 screened patients will have a false positive result possibly leading to more radiation exposure, cost, anxiety, and / or invasive procedures. 95% of false positives will not lead to a diagnosis of cancer.

## 2019-07-30 ENCOUNTER — Encounter: Payer: Self-pay | Admitting: Internal Medicine

## 2019-08-12 ENCOUNTER — Ambulatory Visit (INDEPENDENT_AMBULATORY_CARE_PROVIDER_SITE_OTHER): Payer: BC Managed Care – PPO | Admitting: Internal Medicine

## 2019-08-12 ENCOUNTER — Other Ambulatory Visit: Payer: Self-pay

## 2019-08-12 ENCOUNTER — Encounter: Payer: Self-pay | Admitting: Internal Medicine

## 2019-08-12 VITALS — BP 120/80 | HR 92 | Temp 98.7°F | Resp 16 | Ht 71.0 in | Wt 229.8 lb

## 2019-08-12 DIAGNOSIS — K219 Gastro-esophageal reflux disease without esophagitis: Secondary | ICD-10-CM | POA: Diagnosis not present

## 2019-08-12 DIAGNOSIS — I10 Essential (primary) hypertension: Secondary | ICD-10-CM | POA: Diagnosis not present

## 2019-08-12 DIAGNOSIS — Z1212 Encounter for screening for malignant neoplasm of rectum: Secondary | ICD-10-CM

## 2019-08-12 DIAGNOSIS — E6609 Other obesity due to excess calories: Secondary | ICD-10-CM | POA: Diagnosis not present

## 2019-08-12 DIAGNOSIS — Z6832 Body mass index (BMI) 32.0-32.9, adult: Secondary | ICD-10-CM

## 2019-08-12 DIAGNOSIS — E782 Mixed hyperlipidemia: Secondary | ICD-10-CM | POA: Diagnosis not present

## 2019-08-12 DIAGNOSIS — Z1211 Encounter for screening for malignant neoplasm of colon: Secondary | ICD-10-CM

## 2019-08-12 DIAGNOSIS — G4733 Obstructive sleep apnea (adult) (pediatric): Secondary | ICD-10-CM

## 2019-08-12 DIAGNOSIS — N138 Other obstructive and reflux uropathy: Secondary | ICD-10-CM

## 2019-08-12 DIAGNOSIS — Z800 Family history of malignant neoplasm of digestive organs: Secondary | ICD-10-CM

## 2019-08-12 DIAGNOSIS — F17200 Nicotine dependence, unspecified, uncomplicated: Secondary | ICD-10-CM

## 2019-08-12 DIAGNOSIS — E291 Testicular hypofunction: Secondary | ICD-10-CM

## 2019-08-12 DIAGNOSIS — N401 Enlarged prostate with lower urinary tract symptoms: Secondary | ICD-10-CM

## 2019-08-12 MED ORDER — ATORVASTATIN CALCIUM 40 MG PO TABS: ORAL_TABLET | ORAL | 3 refills | Status: DC

## 2019-08-12 MED ORDER — LISINOPRIL 10 MG PO TABS: ORAL_TABLET | ORAL | 3 refills | Status: DC

## 2019-08-12 NOTE — Patient Instructions (Signed)

## 2019-08-12 NOTE — Progress Notes (Signed)
Patient ID: Jeffery [name], male    DOB: [birth year], 49 y.o.   MRN: [id number]  PCP: Hubbard Hartshorn, FNP  Chief Complaint  Patient presents with  . Follow-up    would like testosterone refilled and sent to compound pharmacy he will not have insurance soon,starting a new job  . Low testosterone  . Hyperlipidemia    Subjective:   Jeffery [name] is a 49 y.o. male, presents to clinic with CC of the following:  Chief Complaint  Patient presents with  . Follow-up    would like testosterone refilled and sent to compound pharmacy he will not have insurance soon,starting a new job  . Low testosterone  . 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  HTN:  Med regimen -lisinopril-10 mg daily -doestake medications as prescribed  Does check blood pressures at home -once a week, 120/70's BP Readings from Last 3 Encounters:  08/12/19 120/80  08/24/18 118/70  03/16/18 130/76    Denies any recent chest pains, palpitations, shortness of breath, increased lower extremity swelling, increased headaches, vision changes.  Obesity Wt Readings from Last 3 Encounters:  08/12/19 229 lb 12.8 oz (104.2 kg)  08/24/18 237 lb (107.5 kg)  08/10/18 248 lb (112.5 kg)   Weight has decreased in the recent past, down almost 20 lbs in a year Diet-tries to eat a healthy diet and modify calories  Exercise-just going to gym for about a month now, has been working out in his home gym. Has been on Ozempic in the past to help with weight loss, used for about 6 months and was very helpful, and since being off of it he has maintained the weight loss.  He noted feeling a little more fatigued on the Ozempic.  Sleep Apnea -  Utilizes CPAP, helpful  Tobacco Abuse: 1ppd  for >25 years.  Denies shortness of breath or wheezing. Not ready to quit at this time, strongly encouraged to try to decrease number cigarettes in the short-term  HLD:  Recent Labs[] Expand by Default       Lab Results  Component Value Date   CHOL 250 (H) 08/14/2018   HDL 35 (L) 08/14/2018   LDLCALC 158 (H) 08/14/2018   TRIG 370 (H) 08/14/2018   CHOLHDL 7.1 (H) 08/14/2018     Med regimen - not taking vascepa for past 6-7 months, on lipitor - 40mg  daily, although admitted had not been taking that all that regularly a little while back.   denies myalgias   GERD: Med regimen -omeprazole daily Discussed concerns with chronic PPI use, with goal of trying better reflux precautions to lessen the need of possible Discussed GERD precautions, and he drinks caffeine, likes spicy food, denies alcoholuse Denies any dark/black stools, no chronic abdominal pains, no vomiting  BPH/hypogonadism: Seeing urology with [facility] for BPH, last visit May 2020 with the following noted: Patient presents for follow-up of LUTS and low testosterone. He currently uses AndroGel, 5 pumps daily. ED is responsive to daily Cialis. He is currently satisfied with his voiding and previously underwent TUMT. He reports he underwent biopsy prior to TUMT, benign.  Takes flomax PRN only.  He is using androgel daily to help with fatigue.  He requested a refill of his testosterone product  to a compound pharmacy, and I requested he try to continue getting that through the urologist, as there are a lot of concerns with testosterone supplementation presently, and the need to closely monitor certain entities including PSAs as it increases risk of prostate cancer, and closely monitor its use with prescriptions.  Also noted it can affect the cholesterol.  He states he is okay with doing so and will ask urology to refill.  I did note if he is having problems with that he can let us know.  Prostate cancer screening PSAs have  been in the 0.7 - 0.81 range since 2018, with the last checked January 2021 (in the [facility] system labs)  Colon cancer screening - COLONOSCOPY 09/12/2016  - had a few polyps, benign and told to f/u in 5 years.  FH of CC (Mother, dx'ed in her 15's):   Low Dose CT for Lung Cancer Screening:  Pastsmoker status reviewed Discussed he will meet the criteria for this when he hits age 43  (The patient meets USPTF recommendations for lung cancer screening with low dose CT in persons age 34 -24 yrs with a 20 pack year history who are currently smoking or have quit in the past 15 yrs.  We have discussed the risks and benefits of screening, including but not limited to the following. Lung cancer screening might detect about one half of lung cancer at an early, curative stage. For the average screened person, the risk of dying from lung cancer will decrease from 1.7% to 1.4% with screening. About 1 in 4 screened patients will have a false positive result possibly leading to more radiation exposure, cost, anxiety, and / or invasive procedures. 95% of false positives will not lead to a diagnosis of cancer).     Patient Active Problem List   Diagnosis Date Noted  . Insulin resistance 08/17/2018  . Metabolic syndrome 98/33/8250  . Gastroesophageal reflux disease without esophagitis 03/17/2018  . Other chest pain 03/16/2018  . Mixed hyperlipidemia 03/16/2018  . Essential hypertension 12/22/2017  . Tobacco abuse counseling 10/10/2017  . Biceps tendon rupture, right, subsequent encounter 03/20/2017  . Hypogonadism male 09/19/2016  . Family history of type 2 diabetes mellitus in mother 04/05/2016  . Urinary incontinence 03/18/2016  . 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 Dyslipidemia 03/10/2015  . 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 Opioid type dependence, abuse ([place]) 03/10/2015  . Adiposity 03/10/2015  . Encounter for annual physical exam 12/23/2014  . History of  nasal septoplasty 11/23/2014  . Chronic prostatitis 10/21/2014  . Overweight (BMI 25.0-29.9) 10/21/2014  . Umbilical hernia 53/97/6734      Current Outpatient Medications:  .  atorvastatin (LIPITOR) 40 MG tablet, TAKE 1 TABLET(40 MG) BY MOUTH DAILY, Disp: 90 tablet, Rfl: 1 .  lisinopril (ZESTRIL) 10 MG tablet, Take one tablet by mouth daily, Disp: 30 tablet, Rfl: 0 .  omeprazole (PRILOSEC) 20 MG capsule, Take 1 capsule (20 mg total) by mouth daily., Disp: 90 capsule, Rfl: 0 .  tadalafil (CIALIS) 5 MG tablet, Take by mouth., Disp: , Rfl:  .  Testosterone (ANDROGEL PUMP) 20.25 MG/ACT (1.62%) GEL, Apply 1 application topically daily., Disp: 75 g, Rfl: 0   Allergies  Allergen Reactions  . Sulfa Antibiotics Nausea Only  . Doxycycline Other (See Comments) and Nausea And Vomiting     Past Surgical History:  Procedure Laterality Date  . HERNIA REPAIR  1/93/79   umbilical hernia  . NASAL SEPTOPLASTY W/ TURBINOPLASTY Bilateral 11/23/2014  Procedure: NASAL SEPTOPLASTY WITH TURBINATE REDUCTION;  Surgeon: Carloyn Manner, MD;  Location: [facility];  Service: ENT;  Laterality: Bilateral;  . PROSTATE BIOPSY  2015  . ROOT CANAL    . WRIST FRACTURE SURGERY Right      Family History  Problem Relation Age of Onset  . Diabetes Mother   . Cancer Mother        Colon Cancer  . Hypertension Father   . Cancer Maternal Grandmother        Lung-  Non-Smoker     Social History   Tobacco Use  . Smoking status: Current Every Day Smoker    Packs/day: 1.00    Years: 25.00    Pack years: 25.00    Types: Cigarettes, E-cigarettes  . Smokeless tobacco: Never Used  . Tobacco comment: Plans to stop in March  Substance Use Topics  . Alcohol use: No    Alcohol/week: 0.0 standard drinks    With staff assistance, above reviewed with the patient today.  ROS: As per HPI, otherwise no specific complaints on a limited and focused system review   No results found for this or any previous  visit (from the past 72 hour(s)).   PHQ2/9: Depression screen [facility] 2/9 08/12/2019 12/23/2018 08/24/2018 08/10/2018 07/15/2018  Decreased Interest 0 0 0 0 0  Down, Depressed, Hopeless 0 0 0 0 0  PHQ - 2 Score 0 0 0 0 0  Altered sleeping 0 0 0 0 0  Tired, decreased energy 0 0 0 0 0  Change in appetite 0 0 0 0 0  Feeling bad or failure about yourself  0 0 0 0 0  Trouble concentrating 0 0 0 0 0  Moving slowly or fidgety/restless 0 0 0 0 0  Suicidal thoughts 0 0 0 0 0  PHQ-9 Score 0 0 0 0 0  Difficult doing work/chores Not difficult at all Not difficult at all Not difficult at all Not difficult at all Not difficult at all  Some recent data might be hidden   PHQ-2/9 Result is neg  Fall Risk: Fall Risk  08/12/2019 12/23/2018 08/24/2018 08/10/2018 07/15/2018  Falls in the past year? 0 0 0 0 0  Number falls in past yr: 0 0 0 0 0  Injury with Fall? 0 0 0 0 0  Risk for fall due to : - - - History of fall(s) -  Follow up - Falls evaluation completed - - -      Objective:   Vitals:   08/12/19 1307  BP: 120/80  Pulse: 92  Resp: 16  Temp: 98.7 F (37.1 C)  TempSrc: Temporal  SpO2: 99%  Weight: 229 lb 12.8 oz (104.2 kg)  Height: 5\' 11"  (1.803 m)    Body mass index is 32.05 kg/m.  Physical Exam   NAD, masked, pleasant HEENT - [place]/AT, sclera anicteric, PERRL, EOMI, conj - non-inj'ed, TM's and canals clear, pharynx clear Neck - supple, no adenopathy, no TM, carotids 2+ and = without bruits bilat Car - RRR without m/g/r Pulm- RR and effort normal at rest, CTA without wheeze or rales Abd - soft, NT, ND, BS+,  no masses, no obvious HSM Back - no CVA tenderness Skin- no rash noted on exposed areas,  GU-followed by urology, exam deferred today with no recent concerning symptoms Ext - no LE edema, no active joints Neuro/psychiatric - affect was not flat, appropriate with conversation  Alert and oriented  Grossly non-focal - good strength on testing extremities,  sensation intact to LT in  distal extremities, DTRs 2+ and equal in the patella, Romberg negative, no pronator drift, good finger-to-nose, good balance on 1 foot, normal gait with good tandem walk  Speech normal   Results for orders placed or performed in visit on 08/24/18  Urinalysis, Routine w reflex microscopic  Result Value Ref Range   Color, Urine YELLOW YELLOW   APPearance CLEAR CLEAR   Specific Gravity, Urine 1.022 1.001 - 1.03   pH 6.0 5.0 - 8.0   Glucose, UA NEGATIVE NEGATIVE   Bilirubin Urine NEGATIVE NEGATIVE   Ketones, ur NEGATIVE NEGATIVE   Hgb urine dipstick NEGATIVE NEGATIVE   Protein, ur NEGATIVE NEGATIVE   Nitrite NEGATIVE NEGATIVE   Leukocytes,Ua NEGATIVE NEGATIVE  POCT urinalysis dipstick  Result Value Ref Range   Color, UA Dark yellow    Clarity, UA clear    Glucose, UA Negative Negative   Bilirubin, UA negative    Ketones, UA negative    Spec Grav, UA 1.010 1.010 - 1.025   Blood, UA negative    pH, UA 5.0 5.0 - 8.0   Protein, UA Negative Negative   Urobilinogen, UA negative (A) 0.2 or 1.0 E.U./dL   Nitrite, UA negative    Leukocytes, UA Negative Negative   Appearance     Odor     Last labs reviewed.    Assessment & Plan:   1. Essential hypertension Blood pressure remains controlled on the lisinopril product. Continue with his current medication regimen and refill today. We will check labs - COMPLETE METABOLIC PANEL WITH GFR - CBC with Differential/Platelet - lisinopril (ZESTRIL) 10 MG tablet; Take one tablet by mouth daily  Dispense: 90 tablet; Refill: 3  2. Mixed hyperlipidemia Concern with his last lipid panel being very abnormal, and reviewed that with him today. He has only been taking atorvastatin, and not sure has been totally compliant over the past year with that.  Had stopped the Zetia product as he ran out of the medicine. Dietary modifications important to help, also staying active with regular physical exercise as he is doing important. He will return tomorrow  fasting, to recheck the lipid panel and await that result. I noted that it is likely to not be much better, and likely will need to restart the Zetia if the triglycerides are still high and possibly alter the Lipitor dose, and await those results. I did refill the statin for him today as requested. - Lipid panel - COMPLETE METABOLIC PANEL WITH GFR - CBC with Differential/Platelet - atorvastatin (LIPITOR) 40 MG tablet; TAKE 1 TABLET(40 MG) BY MOUTH DAILY  Dispense: 90 tablet; Refill: 3  3. Gastroesophageal reflux disease without esophagitis Takes omeprazole daily. Reviewed reflux precautions with him today including providing information in the AVS.  Emphasized trying to increase reflux precautions to try to help lessen the omeprazole use as discussed the potential long-term concerns with chronic PPI use noted in the recent past. He noted he can try to do so. - CBC with Differential/Platelet  4. Class 1 obesity due to excess calories with serious comorbidity and body mass index (BMI) of 32.0 to 32.9 in adult The importance of maintaining a good healthy weight maintenance was discussed, and the importance of not gaining that weight back: That he has lost.  Continuing to stay active important, also continuing dietary modifications to help.  5. Obstructive sleep apnea syndrome Continue the CPAP.  6. Tobacco dependence Did strongly encourage complete tobacco cessation, although he admits he is  not ready to do so at this time. Strongly encouraged trying to even reduce numbers of cigarettes as a start.   7. Benign prostatic hyperplasia with urinary obstruction 8. Hypogonadism male Continue to follow-up with urology, and I would prefer that the testosterone prescription continue to be from urology and discussed that reasoning with him today.   15. Family history of colon cancer in mother Reviewed colon cancer screening, and he will be due again for screening 5 years from his last scoping  procedure, in 2023.  10. Screening for colorectal cancer As above   Await lab results, which she will return tomorrow morning to get fasting.  I did inform him that I will be out of the office next week, and there may be a delay in receiving the interpretation of the lab results, although they will be available on MyChart for him to review. He states he usually follows up yearly, and feel that is reasonable, and await lab results presently.  Can follow-up sooner as needed.     Towanda Malkin, MD 08/12/19 1:15 PM

## 2019-08-13 DIAGNOSIS — E782 Mixed hyperlipidemia: Secondary | ICD-10-CM | POA: Diagnosis not present

## 2019-08-13 DIAGNOSIS — K219 Gastro-esophageal reflux disease without esophagitis: Secondary | ICD-10-CM | POA: Diagnosis not present

## 2019-08-13 DIAGNOSIS — I10 Essential (primary) hypertension: Secondary | ICD-10-CM | POA: Diagnosis not present

## 2019-08-13 LAB — LIPID PANEL
Cholesterol: 200 mg/dL — ABNORMAL HIGH (ref <200)
HDL: 32 mg/dL — ABNORMAL LOW (ref >=40)
LDL Cholesterol (Calc): 133 mg/dL (calc) — ABNORMAL HIGH
Non-HDL Cholesterol (Calc): 168 mg/dL (calc) — ABNORMAL HIGH (ref <130)
Total CHOL/HDL Ratio: 6.3 (calc) — ABNORMAL HIGH (ref <5.0)
Triglycerides: 212 mg/dL — ABNORMAL HIGH (ref <150)

## 2019-08-13 LAB — COMPLETE METABOLIC PANEL WITH GFR
AG Ratio: 1.9 (calc) (ref 1.0–2.5)
ALT: 17 U/L (ref 9–46)
AST: 27 U/L (ref 10–40)
Albumin: 4.3 g/dL (ref 3.6–5.1)
Alkaline phosphatase (APISO): 71 U/L (ref 36–130)
BUN: 23 mg/dL (ref 7–25)
CO2: 28 mmol/L (ref 20–32)
Calcium: 9.4 mg/dL (ref 8.6–10.3)
Chloride: 105 mmol/L (ref 98–110)
Creat: 1.06 mg/dL (ref 0.60–1.35)
GFR, Est African American: 97 mL/min/{1.73_m2} (ref >=60)
GFR, Est Non African American: 84 mL/min/{1.73_m2} (ref >=60)
Globulin: 2.3 g/dL (calc) (ref 1.9–3.7)
Glucose, Bld: 104 mg/dL — ABNORMAL HIGH (ref 65–99)
Potassium: 4.7 mmol/L (ref 3.5–5.3)
Sodium: 139 mmol/L (ref 135–146)
Total Bilirubin: 0.3 mg/dL (ref 0.2–1.2)
Total Protein: 6.6 g/dL (ref 6.1–8.1)

## 2019-08-13 LAB — CBC WITH DIFFERENTIAL/PLATELET
Absolute Monocytes: 639 cells/uL (ref 200–950)
Basophils Absolute: 63 cells/uL (ref 0–200)
Basophils Relative: 0.7 %
Eosinophils Absolute: 126 cells/uL (ref 15–500)
Eosinophils Relative: 1.4 %
HCT: 45.7 % (ref 38.5–50.0)
Hemoglobin: 15.4 g/dL (ref 13.2–17.1)
Lymphs Abs: 2727 cells/uL (ref 850–3900)
MCH: 30.6 pg (ref 27.0–33.0)
MCHC: 33.7 g/dL (ref 32.0–36.0)
MCV: 90.7 fL (ref 80.0–100.0)
MPV: 10.1 fL (ref 7.5–12.5)
Monocytes Relative: 7.1 %
Neutro Abs: 5445 cells/uL (ref 1500–7800)
Neutrophils Relative %: 60.5 %
Platelets: 261 10*3/uL (ref 140–400)
RBC: 5.04 10*6/uL (ref 4.20–5.80)
RDW: 13.1 % (ref 11.0–15.0)
Total Lymphocyte: 30.3 %
WBC: 9 10*3/uL (ref 3.8–10.8)

## 2019-11-09 DIAGNOSIS — G4733 Obstructive sleep apnea (adult) (pediatric): Secondary | ICD-10-CM | POA: Diagnosis not present

## 2019-11-21 ENCOUNTER — Ambulatory Visit: Admit: 2019-11-21 | Discharge status: Absent without leave | Payer: Self-pay

## 2019-11-21 DIAGNOSIS — N410 Acute prostatitis: Secondary | ICD-10-CM | POA: Diagnosis not present

## 2019-11-21 DIAGNOSIS — R300 Dysuria: Secondary | ICD-10-CM | POA: Diagnosis not present

## 2019-11-21 DIAGNOSIS — Z6831 Body mass index (BMI) 31.0-31.9, adult: Secondary | ICD-10-CM | POA: Diagnosis not present

## 2019-11-23 ENCOUNTER — Telehealth: Payer: Self-pay

## 2019-11-23 DIAGNOSIS — N138 Other obstructive and reflux uropathy: Secondary | ICD-10-CM

## 2019-11-23 NOTE — Telephone Encounter (Signed)
Yes, please proceed with the referral, Thanks, [facility]

## 2019-11-23 NOTE — Telephone Encounter (Signed)
Ok to refer or would you prefer patient be seen here first? I pended the urology referral.   Copied from [place] [phone]. Topic: General - Inquiry >> Nov 23, 2019 11:37 AM Greggory Keen D wrote: Reason for CRM: Pt called saying he went to the [facility] in [facility] Sunday for UTI and prostate problems.  He said he has a long history of this.  He wants to know if can be referred to [facility] asap.  He called them and they can get him in next week but he will need a referral.  CB#  [phone]

## 2019-11-25 ENCOUNTER — Ambulatory Visit: Payer: Self-pay | Admitting: Internal Medicine

## 2019-11-26 ENCOUNTER — Encounter: Payer: Self-pay | Admitting: Urology

## 2019-11-26 ENCOUNTER — Ambulatory Visit (INDEPENDENT_AMBULATORY_CARE_PROVIDER_SITE_OTHER): Payer: BC Managed Care – PPO | Admitting: Urology

## 2019-11-26 ENCOUNTER — Other Ambulatory Visit: Payer: Self-pay

## 2019-11-26 VITALS — BP 114/71 | HR 89 | Ht 71.0 in | Wt 235.0 lb

## 2019-11-26 DIAGNOSIS — E291 Testicular hypofunction: Secondary | ICD-10-CM

## 2019-11-26 DIAGNOSIS — N402 Nodular prostate without lower urinary tract symptoms: Secondary | ICD-10-CM

## 2019-11-26 DIAGNOSIS — N400 Enlarged prostate without lower urinary tract symptoms: Secondary | ICD-10-CM

## 2019-11-26 LAB — BLADDER SCAN AMB NON-IMAGING

## 2019-11-26 NOTE — Progress Notes (Signed)
11/26/2019 3:46 PM   Jeffery [name] 1971-02-18 [id number]  Referring provider: Towanda Malkin, MD [address] [place],  [place] [address]  Chief Complaint  Patient presents with  . Benign Prostatic Hypertrophy    New Patient    HPI:  Jeffery [name] was referred over for symptoms of low back pain and dysuria. He thought he had a "prostate infection". He also had some low back pain and constipation.  A September 2021 UA was negative and culture negative.  He was treated with Levaquin x1 month. Symptoms improved. No gross hematuria. He underwent CT scan of the abdomen and pelvis in 2018 with a benign report for pain and a scrotal ultrasound in 2018 also benign report for pain.   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   He has a history of BPH on daily tadalafil.  He also takes tamsulosin daily. Prior prostate biopsy with Dr. Yves [name] around 2014 for a prostate nodule. He then underwent TUMT around 2015. He transferred to [facility].   PVR today 11 ml.   He has a history of low testosterone on Androgel 1% testosterone packets using 100 mg a day or two packs. His January 2021 PSA was 0.72 and testosterone 157. He's been on T replacement since 2013. He has kids. He had significant fatigue and it helped.    PMH: Past Medical History:  Diagnosis Date  . Dental crowns present    sides and back  . Dyslipidemia   . Enlarged prostate   . Epididymitis   . Headache    sinus  . Hypotestosteronism   . Lower urinary tract symptoms (LUTS) 06/14/2016  . Nodular prostate with lower urinary tract symptoms   . Obesity   . Testosterone deficiency   . Umbilical hernia   . Umbilical hernia without obstruction or gangrene     Surgical History: Past Surgical History:  Procedure Laterality Date  . HERNIA REPAIR  4/48/18   umbilical hernia  . NASAL SEPTOPLASTY W/ TURBINOPLASTY Bilateral 11/23/2014   Procedure: NASAL SEPTOPLASTY WITH TURBINATE REDUCTION;  Surgeon:  Jeffery Manner, MD;  Location: [facility];  Service: ENT;  Laterality: Bilateral;  . PROSTATE BIOPSY  2015  . ROOT CANAL    . WRIST FRACTURE SURGERY Right     Home Medications:  Allergies as of 11/26/2019      Reactions   Sulfa Antibiotics Nausea Only   Doxycycline Other (See Comments), Nausea And Vomiting      Medication List       Accurate as of November 26, 2019  3:46 PM. If you have any questions, ask your nurse or doctor.        atorvastatin 40 MG tablet Commonly known as: LIPITOR TAKE 1 TABLET(40 MG) BY MOUTH DAILY   levofloxacin 500 MG tablet Commonly known as: LEVAQUIN Take 500 mg by mouth daily.   lisinopril 10 MG tablet Commonly known as: ZESTRIL Take one tablet by mouth daily   omeprazole 20 MG capsule Commonly known as: PRILOSEC Take 1 capsule (20 mg total) by mouth daily.   tadalafil 5 MG tablet Commonly known as: CIALIS Take by mouth.   Testosterone 20.25 MG/ACT (1.62%) Gel Commonly known as: AndroGel Pump Apply 1 application topically daily.       Allergies:  Allergies  Allergen Reactions  . Sulfa Antibiotics Nausea Only  . Doxycycline Other (See Comments) and Nausea And Vomiting    Family History: Family History  Problem Relation Age of Onset  .  Diabetes Mother   . Cancer Mother        Colon Cancer  . Hypertension Father   . Cancer Maternal Grandmother        Lung-  Non-Smoker    Social History:  reports that he has been smoking cigarettes and e-cigarettes. He has a 25.00 pack-year smoking history. He has never used smokeless tobacco. He reports that he does not drink alcohol and does not use drugs.   Physical Exam: BP 114/71   Pulse 89   Ht 5\' 11"  (1.803 m)   Wt 235 lb (106.6 kg)   BMI 32.78 kg/m   Constitutional:  Alert and oriented, No acute distress. HEENT: [place] AT, moist mucus membranes.  Trachea midline, no masses. Cardiovascular: No clubbing, cyanosis, or edema. Respiratory: Normal respiratory effort, no  increased work of breathing. GI: Abdomen is soft, nontender, nondistended, no abdominal masses GU: No CVA tenderness GU: GU: Penis uncircumcised, normal foreskin, testicles descended bilaterally and palpably normal, bilateral epididymis palpably normal, scrotum normal DRE: Prostate 25 g, smooth without hard area but 5 mm nodule left apex.  Lymph: No cervical or inguinal lymphadenopathy. Skin: No rashes, bruises or suspicious lesions. Neurologic: Grossly intact, no focal deficits, moving all 4 extremities. Psychiatric: Normal mood and affect.  Laboratory Data: Lab Results  Component Value Date   WBC 9.0 08/13/2019   HGB 15.4 08/13/2019   HCT 45.7 08/13/2019   MCV 90.7 08/13/2019   PLT 261 08/13/2019    Lab Results  Component Value Date   CREATININE 1.06 08/13/2019    Lab Results  Component Value Date   PSA Normal 07/05/2013    No results found for: TESTOSTERONE  Lab Results  Component Value Date   HGBA1C 5.3 08/14/2018    Urinalysis    Component Value Date/Time   COLORURINE YELLOW 08/24/2018 0851   APPEARANCEUR CLEAR 08/24/2018 0851   APPEARANCEUR Clear 05/27/2014 2107   LABSPEC 1.022 08/24/2018 0851   LABSPEC 1.014 05/27/2014 2107   PHURINE 6.0 08/24/2018 0851   GLUCOSEU NEGATIVE 08/24/2018 0851   GLUCOSEU Negative 05/27/2014 2107   HGBUR NEGATIVE 08/24/2018 0851   BILIRUBINUR negative 08/24/2018 0832   BILIRUBINUR Negative 05/27/2014 2107   [place] NEGATIVE 08/24/2018 0851   PROTEINUR NEGATIVE 08/24/2018 0851   PROTEINUR Negative 08/24/2018 0832   UROBILINOGEN negative (A) 08/24/2018 0832   NITRITE NEGATIVE 08/24/2018 0851   NITRITE negative 08/24/2018 0832   LEUKOCYTESUR NEGATIVE 08/24/2018 0851   LEUKOCYTESUR Negative 08/24/2018 0832   LEUKOCYTESUR Negative 05/27/2014 2107    Lab Results  Component Value Date   BACTERIA NONE SEEN 05/27/2014    Pertinent Imaging: N/a  No results found for this or any previous visit.  No results found for this  or any previous visit.  No results found for this or any previous visit.  No results found for this or any previous visit.  No results found for this or any previous visit.  No results found for this or any previous visit.  No results found for this or any previous visit.  No results found for this or any previous visit.   Assessment & Plan:    1. Benign prostatic hyperplasia, unspecified whether lower urinary tract symptoms present Stable on a daily tamsulosin and tadalafil.  - BLADDER SCAN AMB NON-IMAGING - Urinalysis, Complete  2. Low T - check Testosterone, hematocrit, LH and prolactin. Discussed nature r/b/a to T replacement including blood clots, MI, sudden death among other risks. 100 mg daily applied today.  3. Prostate nodule - feels isolated on exam. Given prior h/o nodule, negative bx and low PSA, I believe we can monitor. Check PSA in a few months when he returns.   No follow-ups on file.  Festus Aloe, MD  [facility] [address] [place], [place] [address] [phone]

## 2019-11-27 LAB — URINALYSIS, COMPLETE
Bilirubin, UA: NEGATIVE
Glucose, UA: NEGATIVE
Ketones, UA: NEGATIVE
Leukocytes,UA: NEGATIVE
Nitrite, UA: NEGATIVE
Protein,UA: NEGATIVE
RBC, UA: NEGATIVE
Specific Gravity, UA: 1.025 (ref 1.005–1.030)
Urobilinogen, Ur: 0.2 mg/dL (ref 0.2–1.0)
pH, UA: 7 (ref 5.0–7.5)

## 2019-11-27 LAB — HEMATOCRIT: Hematocrit: 45 % (ref 37.5–51.0)

## 2019-11-27 LAB — LUTEINIZING HORMONE: LH: 3.6 m[IU]/mL (ref 1.7–8.6)

## 2019-11-27 LAB — TESTOSTERONE: Testosterone: 179 ng/dL — ABNORMAL LOW (ref 264–916)

## 2019-11-29 ENCOUNTER — Telehealth: Payer: Self-pay

## 2019-11-29 NOTE — Telephone Encounter (Signed)
Notified patient as advised, patient verbalized understanding.  

## 2019-11-29 NOTE — Telephone Encounter (Signed)
-----   Message from Festus Aloe, MD sent at 11/29/2019  9:22 AM EDT ----- Let Mr Mano know his testosterone is low normal, blood counts normal - continue current T dose ----- Message ----- From: Chrystie Nose, CMA Sent: 11/29/2019   7:37 AM EDT To: Festus Aloe, MD   ----- Message ----- From: Interface, [facility] Lab Results In Sent: 11/27/2019   5:38 AM EDT To: Rowe Daneil Clinical

## 2019-11-29 NOTE — Telephone Encounter (Signed)
-----   Message from Festus Aloe, MD sent at 11/29/2019  9:22 AM EDT ----- Let [name] know his testosterone is low normal, blood counts normal - continue current T dose ----- Message ----- From: Chrystie Nose, CMA Sent: 11/29/2019   7:37 AM EDT To: Festus Aloe, MD   ----- Message ----- From: Interface, [facility] Lab Results In Sent: 11/27/2019   5:38 AM EDT To: Jeffery [name] Clinical

## 2019-11-29 NOTE — Telephone Encounter (Signed)
ERROR

## 2019-12-03 DIAGNOSIS — G4733 Obstructive sleep apnea (adult) (pediatric): Secondary | ICD-10-CM | POA: Diagnosis not present

## 2019-12-14 DIAGNOSIS — M9903 Segmental and somatic dysfunction of lumbar region: Secondary | ICD-10-CM | POA: Diagnosis not present

## 2019-12-14 DIAGNOSIS — M5418 Radiculopathy, sacral and sacrococcygeal region: Secondary | ICD-10-CM | POA: Diagnosis not present

## 2019-12-14 DIAGNOSIS — M4306 Spondylolysis, lumbar region: Secondary | ICD-10-CM | POA: Diagnosis not present

## 2019-12-14 DIAGNOSIS — M9905 Segmental and somatic dysfunction of pelvic region: Secondary | ICD-10-CM | POA: Diagnosis not present

## 2019-12-15 DIAGNOSIS — M4306 Spondylolysis, lumbar region: Secondary | ICD-10-CM | POA: Diagnosis not present

## 2019-12-15 DIAGNOSIS — M5418 Radiculopathy, sacral and sacrococcygeal region: Secondary | ICD-10-CM | POA: Diagnosis not present

## 2019-12-15 DIAGNOSIS — M9903 Segmental and somatic dysfunction of lumbar region: Secondary | ICD-10-CM | POA: Diagnosis not present

## 2019-12-15 DIAGNOSIS — M9905 Segmental and somatic dysfunction of pelvic region: Secondary | ICD-10-CM | POA: Diagnosis not present

## 2019-12-17 DIAGNOSIS — M545 Low back pain, unspecified: Secondary | ICD-10-CM | POA: Diagnosis not present

## 2019-12-17 DIAGNOSIS — M47816 Spondylosis without myelopathy or radiculopathy, lumbar region: Secondary | ICD-10-CM | POA: Diagnosis not present

## 2019-12-17 DIAGNOSIS — M7918 Myalgia, other site: Secondary | ICD-10-CM | POA: Diagnosis not present

## 2019-12-20 DIAGNOSIS — M9903 Segmental and somatic dysfunction of lumbar region: Secondary | ICD-10-CM | POA: Diagnosis not present

## 2019-12-20 DIAGNOSIS — M4306 Spondylolysis, lumbar region: Secondary | ICD-10-CM | POA: Diagnosis not present

## 2019-12-20 DIAGNOSIS — M5418 Radiculopathy, sacral and sacrococcygeal region: Secondary | ICD-10-CM | POA: Diagnosis not present

## 2019-12-20 DIAGNOSIS — M9905 Segmental and somatic dysfunction of pelvic region: Secondary | ICD-10-CM | POA: Diagnosis not present

## 2019-12-23 DIAGNOSIS — M9905 Segmental and somatic dysfunction of pelvic region: Secondary | ICD-10-CM | POA: Diagnosis not present

## 2019-12-23 DIAGNOSIS — M5418 Radiculopathy, sacral and sacrococcygeal region: Secondary | ICD-10-CM | POA: Diagnosis not present

## 2019-12-23 DIAGNOSIS — M9903 Segmental and somatic dysfunction of lumbar region: Secondary | ICD-10-CM | POA: Diagnosis not present

## 2019-12-23 DIAGNOSIS — M4306 Spondylolysis, lumbar region: Secondary | ICD-10-CM | POA: Diagnosis not present

## 2020-02-08 ENCOUNTER — Other Ambulatory Visit: Payer: Self-pay

## 2020-02-08 DIAGNOSIS — N400 Enlarged prostate without lower urinary tract symptoms: Secondary | ICD-10-CM

## 2020-02-08 DIAGNOSIS — E291 Testicular hypofunction: Secondary | ICD-10-CM

## 2020-02-08 MED ORDER — TADALAFIL 5 MG PO TABS: 5.0000 mg | ORAL_TABLET | Freq: Every day | ORAL | 11 refills | Status: DC

## 2020-02-08 MED ORDER — TADALAFIL 10 MG PO TABS: 10.0000 mg | ORAL_TABLET | Freq: Every day | ORAL | 6 refills | Status: DC | PRN

## 2020-02-08 NOTE — Telephone Encounter (Signed)
Incoming call from pt who states that he is in need of refills for Testosterone, which he would like sent to [facility] he applies 100mg  daily. He also states that he has been using Cialis 10mg  on demand and would like an rx for that in addition to his Cialis daily for BPH. He would like Cialis sent to [facility]. Per urology notes from [place] pt has been taking Cialis this way rx sent. Please advise on testosterone RX and when pt should be seen next and with which provider.

## 2020-02-10 ENCOUNTER — Ambulatory Visit: Payer: BC Managed Care – PPO | Admitting: Internal Medicine

## 2020-02-11 ENCOUNTER — Ambulatory Visit: Payer: BC Managed Care – PPO | Admitting: Internal Medicine

## 2020-02-11 ENCOUNTER — Encounter: Payer: Self-pay | Admitting: Internal Medicine

## 2020-02-11 ENCOUNTER — Other Ambulatory Visit: Payer: Self-pay

## 2020-02-11 VITALS — BP 112/70 | HR 95 | Temp 98.1°F | Resp 16 | Ht 71.0 in | Wt 237.3 lb

## 2020-02-11 DIAGNOSIS — G4733 Obstructive sleep apnea (adult) (pediatric): Secondary | ICD-10-CM

## 2020-02-11 DIAGNOSIS — E291 Testicular hypofunction: Secondary | ICD-10-CM

## 2020-02-11 DIAGNOSIS — L729 Follicular cyst of the skin and subcutaneous tissue, unspecified: Secondary | ICD-10-CM | POA: Diagnosis not present

## 2020-02-11 DIAGNOSIS — I10 Essential (primary) hypertension: Secondary | ICD-10-CM

## 2020-02-11 DIAGNOSIS — E782 Mixed hyperlipidemia: Secondary | ICD-10-CM

## 2020-02-11 DIAGNOSIS — R5383 Other fatigue: Secondary | ICD-10-CM

## 2020-02-11 NOTE — Progress Notes (Signed)
Patient ID: Jeffery [name], male    DOB: [birth year], 49 y.o.   MRN: [id number]  PCP: Towanda Malkin, MD  Chief Complaint  Patient presents with  . Mass    On head that comes and goes.  . Fatigue    Subjective:   Jeffery [name] is a 49 y.o. male, presents to clinic with CC of the following:  Chief Complaint  Patient presents with  . Mass    On head that comes and goes.  . Fatigue    HPI:  Patient is a 49 year old male Last visit with me was 08/12/2019 Follows up today with a lump on his head and fatigue.  He notes he felt this bump on his upper forehead area in the hairbearing area for the past month.  It tends to get bigger and then smaller intermittently, often can be tender, and he does wear a hard hat when he works and tends to be irritating.  Had no obvious trauma before this came on.  Has never opened up or drained.  He tried to call dermatologist and they did not have an appointment until April.  He also notes he has been exhausted in the recent past, positive daytime somnolence.  He has a sleep apnea history and has been using CPAP at nighttime, wears the entire night, and has had a checked in each had no events noted with checks.  Does think it is helpful at nighttime to rest. He is very active during the day with his work, and just feels more fatigued.  Denies any infectious symptoms recently with no fevers, no cough, no other Covid concerning symptoms.  Denies any bleeding per rectum or dark or black stools, or other bleeding concerns.  No history of thyroid disease.  Denies any other concerning associated symptoms with no chest pains, shortness of breath, palpitations. He does not see a urologist every 6 months for hypogonadism, and does take testosterone supplementation, and has for many years.  Uses AndroGel generic - 100 mg daily.  His last labs done in October noted a slightly lower testosterone level at 179, with his hematocrit still in the normal range at  45.  He also notes he takes a Cialis product daily and has for the last 12 years for erectile dysfunction, and that has been helpful with his ED not problematic here in the recent past.  Can get erections, and not marked decreased sexual concerns in the recent past His other medications were reviewed, and remains on the Lipitor product.     Patient Active Problem List   Diagnosis Date Noted  . Scalp cyst 02/11/2020  . Family history of colon cancer in mother 08/12/2019  . Obstructive sleep apnea syndrome 08/12/2019  . Tobacco dependence 08/12/2019  . Insulin resistance 08/17/2018  . Metabolic syndrome 12/22/2534  . Gastroesophageal reflux disease without esophagitis 03/17/2018  . Other chest pain 03/16/2018  . Mixed hyperlipidemia 03/16/2018  . Essential hypertension 12/22/2017  . Tobacco abuse counseling 10/10/2017  . Biceps tendon rupture, right, subsequent encounter 03/20/2017  . Hypogonadism male 09/19/2016  . Family history of type 2 diabetes mellitus in mother 04/05/2016  . Urinary incontinence 03/18/2016  . 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 Dyslipidemia 03/10/2015  . 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 Opioid type dependence, abuse ([place]) 03/10/2015  . Class 1 obesity due to excess calories with serious comorbidity and body mass index (BMI) of 32.0 to 32.9 in adult 03/10/2015  .  Encounter for annual physical exam 12/23/2014  . History of nasal septoplasty 11/23/2014  . Chronic prostatitis 10/21/2014  . Overweight (BMI 25.0-29.9) 10/21/2014  . Umbilical hernia 73/41/9379      Current Outpatient Medications:  .  atorvastatin (LIPITOR) 40 MG tablet, TAKE 1 TABLET(40 MG) BY MOUTH DAILY, Disp: 90 tablet, Rfl: 3 .  lisinopril (ZESTRIL) 10 MG tablet, Take one tablet by mouth daily, Disp: 90 tablet, Rfl: 3 .  nabumetone (RELAFEN) 750 MG tablet, Take 750 mg by mouth 2 (two) times daily., Disp: , Rfl:  .  omeprazole (PRILOSEC) 20 MG  capsule, Take 1 capsule (20 mg total) by mouth daily., Disp: 90 capsule, Rfl: 0 .  tadalafil (CIALIS) 10 MG tablet, Take 1 tablet (10 mg total) by mouth daily as needed for erectile dysfunction., Disp: 30 tablet, Rfl: 6 .  tadalafil (CIALIS) 5 MG tablet, Take 1 tablet (5 mg total) by mouth daily., Disp: 30 tablet, Rfl: 11 .  testosterone (ANDROGEL) 50 MG/5GM (1%) GEL, , Disp: , Rfl:    Allergies  Allergen Reactions  . Sulfa Antibiotics Nausea Only  . Doxycycline Other (See Comments) and Nausea And Vomiting     Past Surgical History:  Procedure Laterality Date  . HERNIA REPAIR  0/24/09   umbilical hernia  . NASAL SEPTOPLASTY W/ TURBINOPLASTY Bilateral 11/23/2014   Procedure: NASAL SEPTOPLASTY WITH TURBINATE REDUCTION;  Surgeon: Carloyn Manner, MD;  Location: [facility];  Service: ENT;  Laterality: Bilateral;  . PROSTATE BIOPSY  2015  . ROOT CANAL    . WRIST FRACTURE SURGERY Right      Family History  Problem Relation Age of Onset  . Diabetes Mother   . Cancer Mother        Colon Cancer  . Hypertension Father   . Cancer Maternal Grandmother        Lung-  Non-Smoker     Social History   Tobacco Use  . Smoking status: Current Every Day Smoker    Packs/day: 1.00    Years: 25.00    Pack years: 25.00    Types: Cigarettes, E-cigarettes  . Smokeless tobacco: Never Used  . Tobacco comment: Plans to stop in March  Substance Use Topics  . Alcohol use: No    Alcohol/week: 0.0 standard drinks    With staff assistance, above reviewed with the patient today.  ROS: As per HPI, otherwise no specific complaints on a limited and focused system review   No results found for this or any previous visit (from the past 72 hour(s)).   PHQ2/9: Depression screen [facility] 2/9 02/11/2020 08/12/2019 12/23/2018 08/24/2018 08/10/2018  Decreased Interest 0 0 0 0 0  Down, Depressed, Hopeless 0 0 0 0 0  PHQ - 2 Score 0 0 0 0 0  Altered sleeping - 0 0 0 0  Tired, decreased energy - 0 0 0  0  Change in appetite - 0 0 0 0  Feeling bad or failure about yourself  - 0 0 0 0  Trouble concentrating - 0 0 0 0  Moving slowly or fidgety/restless - 0 0 0 0  Suicidal thoughts - 0 0 0 0  PHQ-9 Score - 0 0 0 0  Difficult doing work/chores - Not difficult at all Not difficult at all Not difficult at all Not difficult at all  Some recent data might be hidden   PHQ-2/9 Result is neg  Fall Risk: Fall Risk  02/11/2020 08/12/2019 12/23/2018 08/24/2018 08/10/2018  Falls in the past year?  0 0 0 0 0  Number falls in past yr: 0 0 0 0 0  Injury with Fall? 0 0 0 0 0  Risk for fall due to : - - - - History of fall(s)  Follow up - - Falls evaluation completed - -      Objective:   Vitals:   02/11/20 1437  BP: 112/70  Pulse: 95  Resp: 16  Temp: 98.1 F (36.7 C)  TempSrc: Oral  SpO2: 94%  Weight: 237 lb 4.8 oz (107.6 kg)  Height: 5\' 11"  (1.803 m)    Body mass index is 33.1 kg/m.  Physical Exam   NAD, masked, not ill-appearing, well developed HEENT - [place]/AT, sclera anicteric, PERRL, EOMI, conj - non-inj'ed,  pharynx clear Neck - supple, no adenopathy, no TM, carotids 2+ and = without bruits bilat Car - RRR without m/g/r Pulm- RR and effort normal at rest, CTA without wheeze or rales Abd - soft, NT diffusely, ND,  Back - no CVA tenderness Skin-no bruises noted on exposed areas, denied otherwise A small, slightly less than dime sized raised, mildly erythematous and fluctuant area present on the central anterior scalp in the hairbearing area, was very minimally tender to palpation in the office today.  No other lesions of concern adjacent or on the face. Ext - no LE edema,  Neuro/psychiatric - affect was not flat, appropriate with conversation  Alert with normal speech  Grossly non-focal   Results for orders placed or performed in visit on 11/26/19  Urinalysis, Complete  Result Value Ref Range   Specific Gravity, UA 1.025 1.005 - 1.030   pH, UA 7.0 5.0 - 7.5   Color, UA Yellow  Yellow   Appearance Ur Clear Clear   Leukocytes,UA Negative Negative   Protein,UA Negative Negative/Trace   Glucose, UA Negative Negative   Ketones, UA Negative Negative   RBC, UA Negative Negative   Bilirubin, UA Negative Negative   Urobilinogen, Ur 0.2 0.2 - 1.0 mg/dL   Nitrite, UA Negative Negative  Luteinizing hormone  Result Value Ref Range   LH 3.6 1.7 - 8.6 mIU/mL  Testosterone  Result Value Ref Range   Testosterone 179 (L) 264 - 916 ng/dL  Hematocrit  Result Value Ref Range   Hematocrit 45.0 37.5 - 51.0 %  BLADDER SCAN AMB NON-IMAGING  Result Value Ref Range   Scan Result 11ml    Prior labs reviewed.    Assessment & Plan:   1. Scalp cyst Discussed that this lesion is likely a cystic entity, and it does not happen flow, and does get irritated with wearing a hard hat, and appears slightly irritated today as it was mildly erythematous.  No concerns for abscess or cellulitis concerns presently.  Noted these often ebb and flow and careful with trying to remove or try to aspirate these areas due to location and concern for the hair follicles.  He did want to get a dermatology opinion, and potentially try to get this excised and removed, and a referral was placed today for that. - Ambulatory referral to Dermatology  2. Fatigue, unspecified type Discussed many potential sources of fatigue, and he does have a sleep apnea history.  He does think the CPAP is very effective, and not the source of his fatigue. Noted concerns with low testosterone as a source, with his last testosterone level slightly lower in October.  He is on testosterone supplementation and followed by urology for that.  Did note the concerns of side effects  of testosterone supplementation, and his hematocrit has been fairly stable recently.  Did note we can check some lab test today, although if those return normal, felt best following up with urology again to potentially adjust his testosterone supplementation if  the levels are lower. Did not want to add a testosterone to the labs today as that needs to be an early morning lab draw and he is here in the afternoon. We will check labs as ordered, ensuring there is no anemia concerns, low thyroid, blood sugar concerns, or others. He noted he would be helped by losing some weight, and trying to exercise more, and did agree with more regular exercise to try to help as well.  - COMPLETE METABOLIC PANEL WITH GFR - CBC with Differential/Platelet - TSH  3. Essential hypertension His blood pressure was good today, is on Zestril and will continue - COMPLETE METABOLIC PANEL WITH GFR  4. Obstructive sleep apnea syndrome As noted above. Seems to be effectively managed with the CPAP presently.  May consider rechecking this over time pending his status and results of work-up initiated.  5. Hypogonadism male As above noted.  Is on testosterone supplementation and has been for a long time. May be a contributor and will not check the testosterone with the current labs as noted above, but would recommend if these labs all normal and symptoms continuing.  Also following up with urology to do so and consider adjustments in testosterone supplementation pending assessment. - COMPLETE METABOLIC PANEL WITH GFR - TSH  6. Mixed hyperlipidemia He remains on the Lipitor product, will hold off on adding a lipid panel with these labs in the afternoon, and can recheck again on his next follow-up visit, at the latest plan in June.  Await lab results presently,   Towanda Malkin, MD 02/11/20 3:01 PM

## 2020-02-12 LAB — CBC WITH DIFFERENTIAL/PLATELET
Absolute Monocytes: 659 cells/uL (ref 200–950)
Basophils Absolute: 62 cells/uL (ref 0–200)
Basophils Relative: 0.6 %
Eosinophils Absolute: 134 cells/uL (ref 15–500)
Eosinophils Relative: 1.3 %
HCT: 43.8 % (ref 38.5–50.0)
Hemoglobin: 15.2 g/dL (ref 13.2–17.1)
Lymphs Abs: 3749 cells/uL (ref 850–3900)
MCH: 30.5 pg (ref 27.0–33.0)
MCHC: 34.7 g/dL (ref 32.0–36.0)
MCV: 88 fL (ref 80.0–100.0)
MPV: 10.3 fL (ref 7.5–12.5)
Monocytes Relative: 6.4 %
Neutro Abs: 5696 cells/uL (ref 1500–7800)
Neutrophils Relative %: 55.3 %
Platelets: 285 10*3/uL (ref 140–400)
RBC: 4.98 10*6/uL (ref 4.20–5.80)
RDW: 13.3 % (ref 11.0–15.0)
Total Lymphocyte: 36.4 %
WBC: 10.3 10*3/uL (ref 3.8–10.8)

## 2020-02-12 LAB — COMPLETE METABOLIC PANEL WITH GFR
AG Ratio: 1.7 (calc) (ref 1.0–2.5)
ALT: 11 U/L (ref 9–46)
AST: 22 U/L (ref 10–40)
Albumin: 4.2 g/dL (ref 3.6–5.1)
Alkaline phosphatase (APISO): 65 U/L (ref 36–130)
BUN/Creatinine Ratio: 20 (calc) (ref 6–22)
BUN: 26 mg/dL — ABNORMAL HIGH (ref 7–25)
CO2: 26 mmol/L (ref 20–32)
Calcium: 9.2 mg/dL (ref 8.6–10.3)
Chloride: 104 mmol/L (ref 98–110)
Creat: 1.27 mg/dL (ref 0.60–1.35)
GFR, Est African American: 77 mL/min/{1.73_m2} (ref >=60)
GFR, Est Non African American: 67 mL/min/{1.73_m2} (ref >=60)
Globulin: 2.5 g/dL (calc) (ref 1.9–3.7)
Glucose, Bld: 79 mg/dL (ref 65–99)
Potassium: 4.2 mmol/L (ref 3.5–5.3)
Sodium: 139 mmol/L (ref 135–146)
Total Bilirubin: 0.3 mg/dL (ref 0.2–1.2)
Total Protein: 6.7 g/dL (ref 6.1–8.1)

## 2020-02-12 LAB — TSH: TSH: 0.94 mIU/L (ref 0.40–4.50)

## 2020-02-16 MED ORDER — TESTOSTERONE 50 MG/5GM (1%) TD GEL: 10.0000 g | Freq: Every day | TRANSDERMAL | 3 refills | Status: DC

## 2020-02-23 ENCOUNTER — Telehealth: Payer: Self-pay

## 2020-02-23 DIAGNOSIS — L729 Follicular cyst of the skin and subcutaneous tissue, unspecified: Secondary | ICD-10-CM

## 2020-02-23 NOTE — Telephone Encounter (Signed)
Copied from CRM [id number]. Topic: Quick Communication - See Telephone Encounter >> Feb 23, 2020  3:52 PM Aretta Nip wrote: Pt states was given referral for Dermatoligist and can not see till April, [facility] Dermatologist on [address] can see much sooner would like referral rerouted PT can be reached at [phone]

## 2020-02-23 NOTE — Telephone Encounter (Signed)
Please place new referral so the old one can be closed out due to patient preference.

## 2020-02-23 NOTE — Telephone Encounter (Signed)
Can you please help with this request. Thanks,

## 2020-03-02 DIAGNOSIS — G4733 Obstructive sleep apnea (adult) (pediatric): Secondary | ICD-10-CM | POA: Diagnosis not present

## 2020-03-03 ENCOUNTER — Other Ambulatory Visit: Payer: Self-pay

## 2020-03-03 ENCOUNTER — Other Ambulatory Visit: Payer: BC Managed Care – PPO

## 2020-03-03 ENCOUNTER — Encounter: Payer: Self-pay | Admitting: Internal Medicine

## 2020-03-03 ENCOUNTER — Ambulatory Visit: Payer: Self-pay | Admitting: Urology

## 2020-03-03 DIAGNOSIS — N400 Enlarged prostate without lower urinary tract symptoms: Secondary | ICD-10-CM

## 2020-03-03 DIAGNOSIS — E291 Testicular hypofunction: Secondary | ICD-10-CM | POA: Diagnosis not present

## 2020-03-03 DIAGNOSIS — L720 Epidermal cyst: Secondary | ICD-10-CM | POA: Diagnosis not present

## 2020-03-03 DIAGNOSIS — R635 Abnormal weight gain: Secondary | ICD-10-CM

## 2020-03-03 DIAGNOSIS — R632 Polyphagia: Secondary | ICD-10-CM

## 2020-03-03 DIAGNOSIS — E8881 Metabolic syndrome: Secondary | ICD-10-CM

## 2020-03-03 DIAGNOSIS — R52 Pain, unspecified: Secondary | ICD-10-CM | POA: Diagnosis not present

## 2020-03-04 LAB — PSA: Prostate Specific Ag, Serum: 0.7 ng/mL (ref 0.0–4.0)

## 2020-03-04 LAB — TESTOSTERONE: Testosterone: 502 ng/dL (ref 264–916)

## 2020-03-04 LAB — HEMATOCRIT: Hematocrit: 47.9 % (ref 37.5–51.0)

## 2020-03-06 MED ORDER — OZEMPIC (0.25 OR 0.5 MG/DOSE) 2 MG/1.5ML ~~LOC~~ SOPN: PEN_INJECTOR | SUBCUTANEOUS | 0 refills | Status: AC

## 2020-03-06 NOTE — Telephone Encounter (Signed)
Not opposed to using Ozempic again as requested, and does need to have a follow-up in 4-6 weeks time after starting the Ozempic. Crystal, please make sure he has a follow up appt scheduled in that time frame. Thanks.

## 2020-03-07 ENCOUNTER — Other Ambulatory Visit: Payer: Self-pay | Admitting: Internal Medicine

## 2020-03-07 DIAGNOSIS — R632 Polyphagia: Secondary | ICD-10-CM

## 2020-03-07 DIAGNOSIS — R635 Abnormal weight gain: Secondary | ICD-10-CM

## 2020-03-07 DIAGNOSIS — E8881 Metabolic syndrome: Secondary | ICD-10-CM

## 2020-03-08 ENCOUNTER — Ambulatory Visit: Payer: Self-pay | Admitting: Urology

## 2020-03-08 ENCOUNTER — Telehealth: Payer: Self-pay

## 2020-03-08 NOTE — Telephone Encounter (Signed)
Patient called inquiring about prior auth status of his testosterone gel? He states his pharmacy sent the request over last week. Have we received and started the PA?

## 2020-03-09 NOTE — Telephone Encounter (Signed)
PA received.

## 2020-03-10 NOTE — Telephone Encounter (Signed)
I have not seen this gentleman in clinic, so he will have to wait until his appointment for any testosterone refills.

## 2020-03-10 NOTE — Telephone Encounter (Signed)
Patient notified and rescheduled his appointment for Tuesday.

## 2020-03-13 NOTE — Progress Notes (Incomplete)
03/14/2020 11:18 PM   Jeffery [name] 03/10/1970 [id number]  Referring provider: Towanda Malkin, MD [address] [place],  [place] [address]  No chief complaint on file.  Urological history 1. BPH with LU TS - PSA 0.7 ng/mL on 03/03/2020 - I PSS *** - taking tamsulosin 0.4 mg daily and tadalafil 5 mg daily  2. Testosterone deficiency - applying AndroGel 50mg /5g, 2 packets daily - testosterone level 502 ng/dL on 03/03/2020 - HCT WNL  3. Prostate nodule - PSA 0.7  - Prior prostate biopsy with Dr. Yves Dill around 2014 for a prostate nodule  HPI: Jeffery [name] is a 49 y.o. male who presents today for follow up.          PMH: Past Medical History:  Diagnosis Date  . Dental crowns present    sides and back  . Dyslipidemia   . Enlarged prostate   . Epididymitis   . Headache    sinus  . Hypotestosteronism   . Lower urinary tract symptoms (LUTS) 06/14/2016  . Nodular prostate with lower urinary tract symptoms   . Obesity   . Testosterone deficiency   . Umbilical hernia   . Umbilical hernia without obstruction or gangrene     Surgical History: Past Surgical History:  Procedure Laterality Date  . HERNIA REPAIR  [id number]   umbilical hernia  . NASAL SEPTOPLASTY W/ TURBINOPLASTY Bilateral 11/23/2014   Procedure: NASAL SEPTOPLASTY WITH TURBINATE REDUCTION;  Surgeon: Carloyn Manner, MD;  Location: [facility];  Service: ENT;  Laterality: Bilateral;  . PROSTATE BIOPSY  2015  . ROOT CANAL    . WRIST FRACTURE SURGERY Right     Home Medications:  Allergies as of 03/14/2020      Reactions   Sulfa Antibiotics Nausea Only   Doxycycline Other (See Comments), Nausea And Vomiting      Medication List       Accurate as of March 13, 2020 11:18 PM. If you have any questions, ask your nurse or doctor.        atorvastatin 40 MG tablet Commonly known as: LIPITOR TAKE 1 TABLET(40 MG) BY MOUTH DAILY   lisinopril 10 MG tablet Commonly  known as: ZESTRIL Take one tablet by mouth daily   nabumetone 750 MG tablet Commonly known as: RELAFEN Take 750 mg by mouth 2 (two) times daily.   omeprazole 20 MG capsule Commonly known as: PRILOSEC Take 1 capsule (20 mg total) by mouth daily.   Ozempic (0.25 or 0.5 MG/DOSE) 2 MG/1.5ML Sopn Generic drug: Semaglutide(0.25 or 0.5MG /DOS) Inject 0.25 mg into the skin once a week for 28 days, THEN 0.5 mg once a week. Start taking on: March 06, 2020   tadalafil 5 MG tablet Commonly known as: CIALIS Take 1 tablet (5 mg total) by mouth daily.   tadalafil 10 MG tablet Commonly known as: Cialis Take 1 tablet (10 mg total) by mouth daily as needed for erectile dysfunction.   testosterone 50 MG/5GM (1%) Gel Commonly known as: ANDROGEL   testosterone 50 MG/5GM (1%) Gel Commonly known as: ANDROGEL Place 10 g onto the skin daily.       Allergies:  Allergies  Allergen Reactions  . Sulfa Antibiotics Nausea Only  . Doxycycline Other (See Comments) and Nausea And Vomiting    Family History: Family History  Problem Relation Age of Onset  . Diabetes Mother   . Cancer Mother        Colon Cancer  . Hypertension Father   .  Cancer Maternal Grandmother        Lung-  Non-Smoker    Social History:  reports that he has been smoking cigarettes and e-cigarettes. He has a 25.00 pack-year smoking history. He has never used smokeless tobacco. He reports that he does not drink alcohol and does not use drugs.  ROS: Pertinent ROS in HPI  Physical Exam: There were no vitals taken for this visit.  Constitutional:  Well nourished. Alert and oriented, No acute distress. HEENT: [place] AT, moist mucus membranes.  Trachea midline, no masses. Cardiovascular: No clubbing, cyanosis, or edema. Respiratory: Normal respiratory effort, no increased work of breathing. GI: Abdomen is soft, non tender, non distended, no abdominal masses. Liver and spleen not palpable.  No hernias appreciated.  Stool sample  for occult testing is not indicated.   GU: No CVA tenderness.  No bladder fullness or masses.  Patient with circumcised/uncircumcised phallus. ***Foreskin easily retracted***  Urethral meatus is patent.  No penile discharge. No penile lesions or rashes. Scrotum without lesions, cysts, rashes and/or edema.  Testicles are located scrotally bilaterally. No masses are appreciated in the testicles. Left and right epididymis are normal. Rectal: Patient with  normal sphincter tone. Anus and perineum without scarring or rashes. No rectal masses are appreciated. Prostate is approximately *** grams, *** nodules are appreciated. Seminal vesicles are normal. Skin: No rashes, bruises or suspicious lesions. Lymph: No cervical or inguinal adenopathy. Neurologic: Grossly intact, no focal deficits, moving all 4 extremities. Psychiatric: Normal mood and affect.  Laboratory Data: Lab Results  Component Value Date   WBC 10.3 02/11/2020   HGB 15.2 02/11/2020   HCT 47.9 03/03/2020   MCV 88.0 02/11/2020   PLT 285 02/11/2020    Lab Results  Component Value Date   CREATININE 1.27 02/11/2020    Lab Results  Component Value Date   TESTOSTERONE 502 03/03/2020    Lab Results  Component Value Date   TSH 0.94 02/11/2020       Component Value Date/Time   CHOL 200 (H) 08/13/2019 0804   HDL 32 (L) 08/13/2019 0804   CHOLHDL 6.3 (H) 08/13/2019 0804   VLDL 36 (H) 05/17/2016 0816   LDLCALC 133 (H) 08/13/2019 0804    Lab Results  Component Value Date   AST 22 02/11/2020   Lab Results  Component Value Date   ALT 11 02/11/2020    Urinalysis    Component Value Date/Time   COLORURINE YELLOW 08/24/2018 0851   APPEARANCEUR Clear 11/26/2019 1539   LABSPEC 1.022 08/24/2018 0851   LABSPEC 1.014 05/27/2014 2107   PHURINE 6.0 08/24/2018 0851   GLUCOSEU Negative 11/26/2019 1539   GLUCOSEU Negative 05/27/2014 2107   HGBUR NEGATIVE 08/24/2018 0851   BILIRUBINUR Negative 11/26/2019 1539   BILIRUBINUR  Negative 05/27/2014 2107   [place] NEGATIVE 08/24/2018 0851   PROTEINUR Negative 11/26/2019 1539   PROTEINUR NEGATIVE 08/24/2018 0851   UROBILINOGEN negative (A) 08/24/2018 0832   NITRITE Negative 11/26/2019 1539   NITRITE NEGATIVE 08/24/2018 0851   LEUKOCYTESUR Negative 11/26/2019 1539   LEUKOCYTESUR NEGATIVE 08/24/2018 0851   LEUKOCYTESUR Negative 05/27/2014 2107  I have reviewed the labs.   Pertinent Imaging: No recent imaging  Assessment & Plan:    1. BPH with LUTS IPSS score is ***, it is stable/improving/worsening Continue conservative management, avoiding bladder irritants and timed voiding's Most bothersome symptoms is/are *** Initiate alpha-blocker (***), discussed side effects *** Initiate 5 alpha reductase inhibitor (***), discussed side effects *** Continue tamsulosin 0.4 mg daily, alfuzosin 10  mg daily, Rapaflo 8 mg daily, terazosin, doxazosin, Cialis 5 mg daily and finasteride 5 mg daily, dutasteride 0.5 mg daily***:refills given Cannot tolerate medication or medication failure, schedule cystoscopy *** RTC in *** months for IPSS, PSA, PVR and exam   2. Testosterone deficiency Testosterone at therapeutic levels RTC in 6 months for testosterone, HCT and Hbg   3. Prostate nodule PSA stable RTC in 6 months for DRE and PSA   No follow-ups on file.  These notes generated with voice recognition software. I apologize for typographical errors.  Zara Council, PA-C  [facility] [address]  [place], [place] [address] [phone]

## 2020-03-13 NOTE — Progress Notes (Signed)
03/14/2020 3:32 PM   Jeffery [name] [birth year] [id number]  Referring provider: Towanda Malkin, MD [address] [place],  [place] [address]  Chief Complaint  Patient presents with  . Benign Prostatic Hypertrophy   Urological history 1. BPH with LU TS - PSA 0.7 ng/mL on 03/03/2020 - I PSS 11/2 - taking tamsulosin 0.4 mg prn and tadalafil 5 mg daily  2. Testosterone deficiency - applying AndroGel 50mg /5g, 2 packets daily - testosterone level 502 ng/dL on 03/03/2020 - HCT WNL  3. Prostate nodule - PSA 0.7  - Prior prostate biopsy with Dr. Yves Dill around 2014 for a prostate nodule  4. ED - SHIM 19 - taking Cialis 10 mg on demand dosing   HPI: Jeffery [name] is a 50 y.o. male who presents today for follow up.   Patient still having spontaneous erections. He denies any pain or curvature with erections.      SHIM    Row Name 03/14/20 1454         SHIM: Over the last 6 months:   How do you rate your confidence that you could get and keep an erection? Moderate     When you had erections with sexual stimulation, how often were your erections hard enough for penetration (entering your partner)? Most Times (much more than half the time)     During sexual intercourse, how often were you able to maintain your erection after you had penetrated (entered) your partner? Most Times (much more than half the time)     During sexual intercourse, how difficult was it to maintain your erection to completion of intercourse? Slightly Difficult     When you attempted sexual intercourse, how often was it satisfactory for you? Most Times (much more than half the time)           SHIM Total Score   SHIM 19           Patient denies any modifying or aggravating factors.  Patient denies any gross hematuria, dysuria or suprapubic/flank pain.  Patient denies any fevers, chills, nausea or vomiting.    IPSS    Row Name 03/14/20 1400         International Prostate  Symptom Score   How often have you had the sensation of not emptying your bladder? Less than half the time     How often have you had to urinate less than every two hours? Less than half the time     How often have you found you stopped and started again several times when you urinated? Less than 1 in 5 times     How often have you found it difficult to postpone urination? Less than 1 in 5 times     How often have you had a weak urinary stream? Less than half the time     How often have you had to strain to start urination? Less than half the time     How many times did you typically get up at night to urinate? 1 Time     Total IPSS Score 11           Quality of Life due to urinary symptoms   If you were to spend the rest of your life with your urinary condition just the way it is now how would you feel about that? Mostly Satisfied            Score:  1-7 Mild 8-19 Moderate 20-35 Severe  PMH: Past Medical History:  Diagnosis Date  . Dental crowns present    sides and back  . Dyslipidemia   . Enlarged prostate   . Epididymitis   . Headache    sinus  . Hypotestosteronism   . Lower urinary tract symptoms (LUTS) 06/14/2016  . Nodular prostate with lower urinary tract symptoms   . Obesity   . Testosterone deficiency   . Umbilical hernia   . Umbilical hernia without obstruction or gangrene     Surgical History: Past Surgical History:  Procedure Laterality Date  . HERNIA REPAIR  0/99/83   umbilical hernia  . NASAL SEPTOPLASTY W/ TURBINOPLASTY Bilateral 11/23/2014   Procedure: NASAL SEPTOPLASTY WITH TURBINATE REDUCTION;  Surgeon: Carloyn Manner, MD;  Location: [facility];  Service: ENT;  Laterality: Bilateral;  . PROSTATE BIOPSY  2015  . ROOT CANAL    . WRIST FRACTURE SURGERY Right     Home Medications:  Allergies as of 03/14/2020      Reactions   Sulfa Antibiotics Nausea Only   Doxycycline Other (See Comments), Nausea And Vomiting      Medication List        Accurate as of March 14, 2020  3:32 PM. If you have any questions, ask your nurse or doctor.        atorvastatin 40 MG tablet Commonly known as: LIPITOR TAKE 1 TABLET(40 MG) BY MOUTH DAILY   lisinopril 10 MG tablet Commonly known as: ZESTRIL Take one tablet by mouth daily   nabumetone 750 MG tablet Commonly known as: RELAFEN Take 750 mg by mouth 2 (two) times daily.   omeprazole 20 MG capsule Commonly known as: PRILOSEC Take 1 capsule (20 mg total) by mouth daily.   Ozempic (0.25 or 0.5 MG/DOSE) 2 MG/1.5ML Sopn Generic drug: Semaglutide(0.25 or 0.5MG /DOS) Inject 0.25 mg into the skin once a week for 28 days, THEN 0.5 mg once a week. Start taking on: March 06, 2020   tadalafil 5 MG tablet Commonly known as: CIALIS Take 1 tablet (5 mg total) by mouth daily.   tadalafil 10 MG tablet Commonly known as: Cialis Take 1 tablet (10 mg total) by mouth daily as needed for erectile dysfunction.   testosterone 50 MG/5GM (1%) Gel Commonly known as: ANDROGEL   testosterone 50 MG/5GM (1%) Gel Commonly known as: ANDROGEL Place 10 g onto the skin daily.       Allergies:  Allergies  Allergen Reactions  . Sulfa Antibiotics Nausea Only  . Doxycycline Other (See Comments) and Nausea And Vomiting    Family History: Family History  Problem Relation Age of Onset  . Diabetes Mother   . Cancer Mother        Colon Cancer  . Hypertension Father   . Cancer Maternal Grandmother        Lung-  Non-Smoker    Social History:  reports that he has been smoking cigarettes and e-cigarettes. He has a 25.00 pack-year smoking history. He has never used smokeless tobacco. He reports that he does not drink alcohol and does not use drugs.  ROS: Pertinent ROS in HPI  Physical Exam: BP (!) 159/80   Pulse 92   Ht 5\' 11"  (1.803 m)   Wt 231 lb (104.8 kg)   BMI 32.22 kg/m   Constitutional:  Well nourished. Alert and oriented, No acute distress. HEENT: [place] AT, mask in place.  Trachea  midline, no masses. Cardiovascular: No clubbing, cyanosis, or edema. Respiratory: Normal respiratory effort, no increased work of  breathing. Neurologic: Grossly intact, no focal deficits, moving all 4 extremities. Psychiatric: Normal mood and affect.  Laboratory Data: Lab Results  Component Value Date   WBC 10.3 02/11/2020   HGB 15.2 02/11/2020   HCT 47.9 03/03/2020   MCV 88.0 02/11/2020   PLT 285 02/11/2020    Lab Results  Component Value Date   CREATININE 1.27 02/11/2020    Lab Results  Component Value Date   TESTOSTERONE 502 03/03/2020    Lab Results  Component Value Date   TSH 0.94 02/11/2020       Component Value Date/Time   CHOL 200 (H) 08/13/2019 0804   HDL 32 (L) 08/13/2019 0804   CHOLHDL 6.3 (H) 08/13/2019 0804   VLDL 36 (H) 05/17/2016 0816   LDLCALC 133 (H) 08/13/2019 0804    Lab Results  Component Value Date   AST 22 02/11/2020   Lab Results  Component Value Date   ALT 11 02/11/2020    Urinalysis    Component Value Date/Time   COLORURINE YELLOW 08/24/2018 0851   APPEARANCEUR Clear 11/26/2019 1539   LABSPEC 1.022 08/24/2018 0851   LABSPEC 1.014 05/27/2014 2107   PHURINE 6.0 08/24/2018 0851   GLUCOSEU Negative 11/26/2019 1539   GLUCOSEU Negative 05/27/2014 2107   HGBUR NEGATIVE 08/24/2018 0851   BILIRUBINUR Negative 11/26/2019 1539   BILIRUBINUR Negative 05/27/2014 2107   [place] NEGATIVE 08/24/2018 0851   PROTEINUR Negative 11/26/2019 1539   PROTEINUR NEGATIVE 08/24/2018 0851   UROBILINOGEN negative (A) 08/24/2018 0832   NITRITE Negative 11/26/2019 1539   NITRITE NEGATIVE 08/24/2018 0851   LEUKOCYTESUR Negative 11/26/2019 1539   LEUKOCYTESUR NEGATIVE 08/24/2018 0851   LEUKOCYTESUR Negative 05/27/2014 2107  I have reviewed the labs.   Pertinent Imaging: No recent imaging  Assessment & Plan:    1. BPH with LUTS IPSS score is 11/2 Continue conservative management, avoiding bladder irritants and timed voiding's Continue Cialis  5 mg daily - refills given RTC in 6 months for IPSS, PSA and exam   2. Testosterone deficiency Testosterone at therapeutic levels Continue AndroGel 1% gel, 2 packets daily  RTC in 6 months for testosterone, HCT and Hbg   3. Prostate nodule PSA stable RTC in 6 months for DRE and PSA  4. ED SHIM 19 Continue Cialis 10 mg on demand dosing Return to clinic in 6 months for [facility] IM and exam   Return in about 6 months (around 09/11/2020) for PSA, testosterone (2 to 4 hours after application) H & H, IPSS, SHIM and exam.  These notes generated with voice recognition software. I apologize for typographical errors.  Zara Council, PA-C  [facility] [address]  [place], [place] [address] [phone]

## 2020-03-14 ENCOUNTER — Other Ambulatory Visit: Payer: Self-pay

## 2020-03-14 ENCOUNTER — Ambulatory Visit (INDEPENDENT_AMBULATORY_CARE_PROVIDER_SITE_OTHER): Payer: BC Managed Care – PPO | Admitting: Urology

## 2020-03-14 ENCOUNTER — Encounter: Payer: Self-pay | Admitting: Urology

## 2020-03-14 VITALS — BP 159/80 | HR 92 | Ht 71.0 in | Wt 231.0 lb

## 2020-03-14 DIAGNOSIS — N529 Male erectile dysfunction, unspecified: Secondary | ICD-10-CM

## 2020-03-14 DIAGNOSIS — N402 Nodular prostate without lower urinary tract symptoms: Secondary | ICD-10-CM | POA: Diagnosis not present

## 2020-03-14 DIAGNOSIS — E349 Endocrine disorder, unspecified: Secondary | ICD-10-CM | POA: Diagnosis not present

## 2020-03-14 DIAGNOSIS — N400 Enlarged prostate without lower urinary tract symptoms: Secondary | ICD-10-CM | POA: Diagnosis not present

## 2020-03-14 MED ORDER — TADALAFIL 5 MG PO TABS
5.0000 mg | ORAL_TABLET | Freq: Every day | ORAL | 3 refills | Status: DC
Start: 2020-03-14 — End: 2021-05-08

## 2020-03-14 MED ORDER — TESTOSTERONE 50 MG/5GM (1%) TD GEL: 10.0000 g | Freq: Every day | TRANSDERMAL | 3 refills | Status: DC

## 2020-03-14 MED ORDER — TADALAFIL 10 MG PO TABS
10.0000 mg | ORAL_TABLET | Freq: Every day | ORAL | 6 refills | Status: DC | PRN
Start: 2020-03-14 — End: 2022-04-17

## 2020-03-15 ENCOUNTER — Telehealth: Payer: Self-pay

## 2020-03-15 NOTE — Telephone Encounter (Signed)
Copied from [place] [id number]. Topic: General - Other >> Mar 14, 2020  4:38 PM Tessa Lerner A wrote: Reason for CRM: Patient is calling in regarding a request for PCP authorization of his prescription for Semaglutide,0.25 or 0.5MG /DOS, (OZEMPIC, 0.25 OR 0.5 MG/DOSE,)  The prescription was rejected by [facility] according to the pharmacy. Patient made initial contact via [place] on 03/07/20 and would like an update on the status of paperwork

## 2020-03-17 ENCOUNTER — Other Ambulatory Visit: Payer: Self-pay | Admitting: Family Medicine

## 2020-03-17 ENCOUNTER — Telehealth: Payer: Self-pay | Admitting: Family Medicine

## 2020-03-17 NOTE — Telephone Encounter (Signed)
LMOM informed patient Testosterone gel has been denied. The RX was sent to [facility] and he can use the Goodrx app to get the medication and a lower cost.

## 2020-03-23 DIAGNOSIS — L720 Epidermal cyst: Secondary | ICD-10-CM | POA: Diagnosis not present

## 2020-04-12 ENCOUNTER — Ambulatory Visit: Payer: Self-pay | Admitting: Urology

## 2020-04-19 ENCOUNTER — Telehealth: Payer: Self-pay

## 2020-04-19 NOTE — Telephone Encounter (Signed)
Called patient to inform him that a peer to peer review has been set up. Insurance company will reach out to Dr. Ky Barban to discuss why he needs Ozempic. He verbalized understanding.

## 2020-04-19 NOTE — Telephone Encounter (Signed)
Copied from [place] [phone]. Topic: General - Other >> Apr 18, 2020  3:59 PM Tessa Lerner A wrote: Reason for CRM: Patient would like to be contacted regarding their medication Patient has a prescription for Semaglutide,0.25 or 0.5MG /DOS, (OZEMPIC, 0.25 OR 0.5 MG/DOSE,) Patient would like to coordinate communication between the practice and their insurance provider  Please contact to advise

## 2020-04-24 ENCOUNTER — Telehealth: Payer: Self-pay

## 2020-04-24 NOTE — Telephone Encounter (Signed)
Ozempic was denied by patient's insurance.

## 2020-04-28 DIAGNOSIS — R070 Pain in throat: Secondary | ICD-10-CM | POA: Diagnosis not present

## 2020-04-28 DIAGNOSIS — F17200 Nicotine dependence, unspecified, uncomplicated: Secondary | ICD-10-CM | POA: Diagnosis not present

## 2020-04-28 DIAGNOSIS — G4733 Obstructive sleep apnea (adult) (pediatric): Secondary | ICD-10-CM | POA: Diagnosis not present

## 2020-06-08 DIAGNOSIS — G4733 Obstructive sleep apnea (adult) (pediatric): Secondary | ICD-10-CM | POA: Diagnosis not present

## 2020-06-14 ENCOUNTER — Other Ambulatory Visit: Payer: Self-pay | Admitting: Urology

## 2020-06-14 DIAGNOSIS — N529 Male erectile dysfunction, unspecified: Secondary | ICD-10-CM

## 2020-06-19 ENCOUNTER — Ambulatory Visit: Payer: BC Managed Care – PPO | Admitting: Dermatology

## 2020-06-20 DIAGNOSIS — J019 Acute sinusitis, unspecified: Secondary | ICD-10-CM | POA: Diagnosis not present

## 2020-06-21 ENCOUNTER — Telehealth: Payer: BC Managed Care – PPO | Admitting: Family Medicine

## 2020-06-22 DIAGNOSIS — G4733 Obstructive sleep apnea (adult) (pediatric): Secondary | ICD-10-CM | POA: Diagnosis not present

## 2020-08-08 ENCOUNTER — Other Ambulatory Visit: Payer: Self-pay

## 2020-08-08 DIAGNOSIS — I10 Essential (primary) hypertension: Secondary | ICD-10-CM

## 2020-08-08 MED ORDER — LISINOPRIL 10 MG PO TABS: ORAL_TABLET | ORAL | 3 refills | Status: DC

## 2020-08-15 ENCOUNTER — Other Ambulatory Visit: Payer: Self-pay | Admitting: Urology

## 2020-08-16 ENCOUNTER — Other Ambulatory Visit: Payer: Self-pay

## 2020-08-16 DIAGNOSIS — N529 Male erectile dysfunction, unspecified: Secondary | ICD-10-CM

## 2020-08-16 NOTE — Telephone Encounter (Signed)
Patient left a message on the triage line requesting a refill on testosterone gel

## 2020-08-18 ENCOUNTER — Encounter: Payer: BC Managed Care – PPO | Admitting: Internal Medicine

## 2020-08-21 ENCOUNTER — Other Ambulatory Visit: Payer: Self-pay

## 2020-08-21 ENCOUNTER — Other Ambulatory Visit: Payer: Self-pay | Admitting: Urology

## 2020-08-21 DIAGNOSIS — N529 Male erectile dysfunction, unspecified: Secondary | ICD-10-CM

## 2020-08-21 MED ORDER — TESTOSTERONE 50 MG/5GM (1%) TD GEL: TRANSDERMAL | 3 refills | Status: DC

## 2020-08-21 NOTE — Telephone Encounter (Signed)
if he is wanting his refill sent to [facility], I suggest he look at good rx and choose a different pharmacy as [place] went up significantly.  that is probably the reason it hasn't been refilled.  Pt would like refill sent to [facility] in [place].

## 2020-08-21 NOTE — Progress Notes (Signed)
Prescription for AndroGel sent to [facility] in [place].

## 2020-08-21 NOTE — Telephone Encounter (Signed)
Pt called office asking for a refill request.

## 2020-08-24 ENCOUNTER — Other Ambulatory Visit: Payer: Self-pay | Admitting: Urology

## 2020-08-24 ENCOUNTER — Telehealth: Payer: Self-pay

## 2020-08-24 ENCOUNTER — Other Ambulatory Visit: Payer: Self-pay | Admitting: *Deleted

## 2020-08-24 ENCOUNTER — Telehealth: Payer: Self-pay | Admitting: *Deleted

## 2020-08-24 MED ORDER — TESTOSTERONE 50 MG/5GM (1%) TD GEL: TRANSDERMAL | 0 refills | Status: DC

## 2020-08-24 NOTE — Telephone Encounter (Signed)
Per Zara Council, PA called in topical testosterone 10% gel into to [facility]

## 2020-08-24 NOTE — Telephone Encounter (Signed)
Done

## 2020-08-24 NOTE — Telephone Encounter (Signed)
Message left on triage line from Ed at [facility], he states that he is calling to clarify the rx that was sent into today as it is different from what the patient has received in the past. Please advise.

## 2020-08-24 NOTE — Telephone Encounter (Signed)
Spoke to pt about prior auth and that it was denied. Pt requesting refill be sent to [facility]. Refill printed and manually faxed to [facility].

## 2020-09-04 ENCOUNTER — Ambulatory Visit (INDEPENDENT_AMBULATORY_CARE_PROVIDER_SITE_OTHER): Payer: BC Managed Care – PPO | Admitting: Family Medicine

## 2020-09-04 ENCOUNTER — Other Ambulatory Visit: Payer: Self-pay

## 2020-09-04 ENCOUNTER — Encounter: Payer: Self-pay | Admitting: Family Medicine

## 2020-09-04 VITALS — BP 116/68 | HR 99 | Temp 98.4°F | Resp 16 | Ht 71.0 in | Wt 244.1 lb

## 2020-09-04 DIAGNOSIS — F17200 Nicotine dependence, unspecified, uncomplicated: Secondary | ICD-10-CM

## 2020-09-04 DIAGNOSIS — Z0000 Encounter for general adult medical examination without abnormal findings: Secondary | ICD-10-CM | POA: Diagnosis not present

## 2020-09-04 DIAGNOSIS — I10 Essential (primary) hypertension: Secondary | ICD-10-CM

## 2020-09-04 DIAGNOSIS — E781 Pure hyperglyceridemia: Secondary | ICD-10-CM

## 2020-09-04 DIAGNOSIS — E8881 Metabolic syndrome: Secondary | ICD-10-CM

## 2020-09-04 DIAGNOSIS — E291 Testicular hypofunction: Secondary | ICD-10-CM

## 2020-09-04 DIAGNOSIS — Z6834 Body mass index (BMI) 34.0-34.9, adult: Secondary | ICD-10-CM

## 2020-09-04 DIAGNOSIS — E782 Mixed hyperlipidemia: Secondary | ICD-10-CM

## 2020-09-04 DIAGNOSIS — Z1159 Encounter for screening for other viral diseases: Secondary | ICD-10-CM

## 2020-09-04 DIAGNOSIS — E669 Obesity, unspecified: Secondary | ICD-10-CM

## 2020-09-04 DIAGNOSIS — Z23 Encounter for immunization: Secondary | ICD-10-CM | POA: Diagnosis not present

## 2020-09-04 DIAGNOSIS — K219 Gastro-esophageal reflux disease without esophagitis: Secondary | ICD-10-CM

## 2020-09-04 MED ORDER — ATORVASTATIN CALCIUM 40 MG PO TABS: ORAL_TABLET | ORAL | 3 refills | Status: DC

## 2020-09-04 MED ORDER — OZEMPIC (0.25 OR 0.5 MG/DOSE) 2 MG/1.5ML ~~LOC~~ SOPN: 0.2500 mg | PEN_INJECTOR | SUBCUTANEOUS | 0 refills | Status: AC

## 2020-09-04 MED ORDER — OZEMPIC (0.25 OR 0.5 MG/DOSE) 2 MG/1.5ML ~~LOC~~ SOPN: 0.5000 mg | PEN_INJECTOR | SUBCUTANEOUS | 1 refills | Status: DC

## 2020-09-04 MED ORDER — OMEPRAZOLE 20 MG PO CPDR: 20.0000 mg | DELAYED_RELEASE_CAPSULE | Freq: Every day | ORAL | 0 refills | Status: DC

## 2020-09-04 NOTE — Progress Notes (Signed)
Patient: Jeffery [name], Male    DOB: 11/27/1970, 50 y.o.   MRN: [id number] Towanda Malkin, MD Visit Date: 09/04/2020  Today's Provider: Delsa Grana, PA-C   Chief Complaint  Patient presents with   Annual Exam   Subjective:   Annual physical exam:  Jeffery [name] is a 50 y.o. male who presents today for health maintenance and annual & complete physical exam.   Exercise/Activity:  started exercising again going to the gym 3 x a week 30 min  Diet/nutrition:  changing what he eats, the last coupe weeks, eat more chicken/fish Sleep: sleeps well  HLD on lipitor 40 mg daily - forgets to take often- taking about 50% of the time, trying to eat better and exercise Lab Results  Component Value Date   CHOL 200 (H) 08/13/2019   HDL 32 (L) 08/13/2019   LDLCALC 133 (H) 08/13/2019   TRIG 212 (H) 08/13/2019   CHOLHDL 6.3 (H) 08/13/2019   The 10-year ASCVD risk score Mikey Bussing DC Jr., et al., 2013) is: 10.2%   Values used to calculate the score:     Age: 67 years     Sex: Male     Is Non-Hispanic African American: No     Diabetic: No     Tobacco smoker: Yes     Systolic Blood Pressure: 093 mmHg     Is BP treated: Yes     HDL Cholesterol: 32 mg/dL     Total Cholesterol: 200 mg/dL No CP, claudication sx, exertional sx  Hypertension:  Currently managed on lisinopril 10 mg  Pt reports good med compliance and denies any SE.   Blood pressure today is well controlled. BP Readings from Last 3 Encounters:  09/04/20 116/68  03/14/20 (!) 159/80  02/11/20 112/70  Pt denies CP, SOB, exertional sx, LE edema, palpitation, Ha's, visual disturbances, lightheadedness, hypotension, syncope. Dietary efforts for BP?  Healthier  HA's are better  Low T - through urology  Obesity - starting to work out more- weight up some from last OV Wt Readings from Last 5 Encounters:  09/04/20 244 lb 1.6 oz (110.7 kg)  03/14/20 231 lb (104.8 kg)  02/11/20 237 lb 4.8 oz (107.6 kg)  11/26/19 235 lb  (106.6 kg)  08/12/19 229 lb 12.8 oz (104.2 kg)   BMI Readings from Last 5 Encounters:  09/04/20 34.05 kg/m  03/14/20 32.22 kg/m  02/11/20 33.10 kg/m  11/26/19 32.78 kg/m  08/12/19 32.05 kg/m    Pt wished to do routine f/up on chronic conditions today in addition to CPE. Advised pt of separate visit billing/coding- discussed today, pt verbalized understanding  USPSTF grade A and B recommendations - reviewed and addressed today  Depression:  Phq 9 completed today by patient, was reviewed by me with patient in the room, score is  negative, pt feels  good PHQ 2/9 Scores 09/04/2020 02/11/2020 08/12/2019 12/23/2018  PHQ - 2 Score 0 0 0 0  PHQ- 9 Score 0 - 0 0   Depression screen [facility] 2/9 09/04/2020 02/11/2020 08/12/2019 12/23/2018 08/24/2018  Decreased Interest 0 0 0 0 0  Down, Depressed, Hopeless 0 0 0 0 0  PHQ - 2 Score 0 0 0 0 0  Altered sleeping 0 - 0 0 0  Tired, decreased energy 0 - 0 0 0  Change in appetite 0 - 0 0 0  Feeling bad or failure about yourself  0 - 0 0 0  Trouble concentrating 0 - 0 0 0  Moving  slowly or fidgety/restless 0 - 0 0 0  Suicidal thoughts 0 - 0 0 0  PHQ-9 Score 0 - 0 0 0  Difficult doing work/chores Not difficult at all - Not difficult at all Not difficult at all Not difficult at all  Some recent data might be hidden    Hep C Screening: due- discussed recommendations STD testing and prevention (HIV/chl/gon/syphilis): done in the past, no addition testing needed today Intimate partner violence:   denies  Prostate cancer: done with urology Prostate cancer screening with PSA: Discussed risks and benefits of PSA testing and provided handout. Pt will not have PSA drawn today.  Lab Results  Component Value Date   PSA Normal 07/05/2013   Pt sees urology for ED, low T, chronic prostatitis   Advanced Care Planning:  A voluntary discussion about advance care planning including the explanation and discussion of advance directives.  Discussed health care  proxy and Living will, and the patient was able to identify a health care proxy as wife Jeffery [name].  Patient does not have a living will at present time. If patient does have living will, I have requested they bring this to the clinic to be scanned in to their chart.  Health Maintenance  Topic Date Due   Pneumococcal Vaccine 36-43 Years old (1 - PCV) Never done   Hepatitis C Screening  Never done   INFLUENZA VACCINE  09/25/2020   COLONOSCOPY (Pts 45-41yrs Insurance coverage will need to be confirmed)  10/12/2021   TETANUS/TDAP  12/19/2026   COVID-19 Vaccine  Completed   HIV Screening  Completed   HPV VACCINES  Aged Out     Skin cancer:   Pt reports no hx of skin cancer, suspicious lesions/biopsies in the past.  Colorectal cancer:  colonoscopy is due next year, family hx    Pt denies   Lung cancer:   Low Dose CT Chest recommended if Age 30-80 years, 20 pack-year currently smoking OR have quit w/in 15years. Patient does not qualify.   Social History   Tobacco Use   Smoking status: Every Day    Packs/day: 1.00    Years: 25.00    Pack years: 25.00    Types: Cigarettes, E-cigarettes   Smokeless tobacco: Never   Tobacco comments:    Plans to stop in March  Substance Use Topics   Alcohol use: No    Alcohol/week: 0.0 standard drinks     Alcohol screening: [place] [facility]  AUDIT-C Score 0       AAA: n/a per age The USPSTF recommends one-time screening with ultrasonography in men ages 19 to 11 years who have ever smoked  ELF:YBOF indicated today  Blood pressure/Hypertension: BP Readings from Last 3 Encounters:  09/04/20 116/68  03/14/20 (!) 159/80  02/11/20 112/70   Weight/Obesity: Wt Readings from Last 3 Encounters:  09/04/20 244 lb 1.6 oz (110.7 kg)  03/14/20 231 lb (104.8 kg)  02/11/20 237 lb 4.8 oz (107.6 kg)   BMI Readings from Last 3 Encounters:  09/04/20 34.05 kg/m  03/14/20 32.22 kg/m   02/11/20 33.10 kg/m    Lipids:  Lab Results  Component Value Date   CHOL 200 (H) 08/13/2019   CHOL 250 (H) 08/14/2018   CHOL 264 (H) 12/22/2017   Lab Results  Component Value Date   HDL 32 (L) 08/13/2019   HDL 35 (L) 08/14/2018   HDL 39 (L) 12/22/2017   Lab Results  Component Value  Date   LDLCALC 133 (H) 08/13/2019   LDLCALC 158 (H) 08/14/2018   LDLCALC 169 (H) 12/22/2017   Lab Results  Component Value Date   TRIG 212 (H) 08/13/2019   TRIG 370 (H) 08/14/2018   TRIG 330 (H) 12/22/2017   Lab Results  Component Value Date   CHOLHDL 6.3 (H) 08/13/2019   CHOLHDL 7.1 (H) 08/14/2018   CHOLHDL 6.8 (H) 12/22/2017   No results found for: LDLDIRECT Based on the results of lipid panel his/her cardiovascular risk factor ( using [facility] )  in the next 10 years is : The 10-year ASCVD risk score Mikey Bussing DC Brooke Bonito., et al., 2013) is: 10.2%   Values used to calculate the score:     Age: 27 years     Sex: Male     Is Non-Hispanic African American: No     Diabetic: No     Tobacco smoker: Yes     Systolic Blood Pressure: 628 mmHg     Is BP treated: Yes     HDL Cholesterol: 32 mg/dL     Total Cholesterol: 200 mg/dL Glucose:  Glucose, Bld  Date Value Ref Range Status  02/11/2020 79 65 - 99 mg/dL Final    Comment:    .            Fasting reference interval .   08/13/2019 104 (H) 65 - 99 mg/dL Final    Comment:    .            Fasting reference interval . For someone without known diabetes, a glucose value between 100 and 125 mg/dL is consistent with prediabetes and should be confirmed with a follow-up test. .   08/14/2018 102 (H) 65 - 99 mg/dL Final    Comment:    .            Fasting reference interval . For someone without known diabetes, a glucose value between 100 and 125 mg/dL is consistent with prediabetes and should be confirmed with a follow-up test. .     Social History      He  reports that he has been smoking cigarettes and e-cigarettes. He has a  25.00 pack-year smoking history. He has never used smokeless tobacco. He reports that he does not drink alcohol and does not use drugs.       Social History   Socioeconomic History   Marital status: Married    Spouse name: Not on file   Number of children: 3   Years of education: Not on file   Highest education level: High school graduate  Occupational History   Occupation: Armed forces training and education officer  Tobacco Use   Smoking status: Every Day    Packs/day: 1.00    Years: 25.00    Pack years: 25.00    Types: Cigarettes, E-cigarettes   Smokeless tobacco: Never   Tobacco comments:    Plans to stop in March  Vaping Use   Vaping Use: Former  Substance and Sexual Activity   Alcohol use: No    Alcohol/week: 0.0 standard drinks   Drug use: No   Sexual activity: Yes    Partners: Female  Other Topics Concern   Not on file  Social History Narrative   Not on file   Social Determinants of Health   Financial Resource Strain: Low Risk    Difficulty of Paying Living Expenses: Not hard at all  Food Insecurity: No Food Insecurity   Worried About Charity fundraiser in  the Last Year: Never true   Ran Out of Food in the Last Year: Never true  Transportation Needs: No Transportation Needs   Lack of Transportation (Medical): No   Lack of Transportation (Non-Medical): No  Physical Activity: Insufficiently Active   Days of Exercise per Week: 3 days   Minutes of Exercise per Session: 30 min  Stress: No Stress Concern Present   Feeling of Stress : Not at all  Social Connections: Moderately Integrated   Frequency of Communication with Friends and Family: More than three times a week   Frequency of Social Gatherings with Friends and Family: Once a week   Attends Religious Services: 1 to 4 times per year   Active Member of [employer] or Organizations: No   Attends Music therapist: Never   Marital Status: Married     Family History        Family Status  Relation Name Status   Mother   Alive   Father  Alive   Sister  Alive   Sister  Alive   MGM  (Not Specified)        His family history includes Cancer in his maternal grandmother and mother; Diabetes in his mother; Hypertension in his father.       Family History  Problem Relation Age of Onset   Diabetes Mother    Cancer Mother        Colon Cancer   Hypertension Father    Cancer Maternal Grandmother        Lung-  Non-Smoker    Patient Active Problem List   Diagnosis Date Noted   Scalp cyst 02/11/2020   Family history of colon cancer in mother 08/12/2019   Obstructive sleep apnea syndrome 08/12/2019   Tobacco dependence 08/12/2019   Insulin resistance 67/89/3810   Metabolic syndrome 17/51/0258   Gastroesophageal reflux disease without esophagitis 03/17/2018   Other chest pain 03/16/2018   Mixed hyperlipidemia 03/16/2018   Essential hypertension 12/22/2017   Tobacco abuse counseling 10/10/2017   Biceps tendon rupture, right, subsequent encounter 03/20/2017   Hypogonadism male 09/19/2016   Family history of type 2 diabetes mellitus in mother 04/05/2016   Urinary incontinence 03/18/2016   Benign prostatic hyperplasia with urinary obstruction 03/10/2015   Dyslipidemia 03/10/2015   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   Opioid type dependence, abuse ([place]) 03/10/2015   Class 1 obesity due to excess calories with serious comorbidity and body mass index (BMI) of 32.0 to 32.9 in adult 03/10/2015   Encounter for annual physical exam 12/23/2014   History of nasal septoplasty 11/23/2014   Chronic prostatitis 10/21/2014   Overweight (BMI 25.0-29.9) 52/77/8242   Umbilical hernia 35/36/1443    Past Surgical History:  Procedure Laterality Date   HERNIA REPAIR  1/54/00   umbilical hernia   NASAL SEPTOPLASTY W/ TURBINOPLASTY Bilateral 11/23/2014   Procedure: NASAL SEPTOPLASTY WITH TURBINATE REDUCTION;  Surgeon: Carloyn Manner, MD;  Location: [facility];  Service: ENT;  Laterality: Bilateral;   PROSTATE  BIOPSY  2015   ROOT CANAL     WRIST FRACTURE SURGERY Right      Current Outpatient Medications:    atorvastatin (LIPITOR) 40 MG tablet, TAKE 1 TABLET(40 MG) BY MOUTH DAILY, Disp: 90 tablet, Rfl: 3   lisinopril (ZESTRIL) 10 MG tablet, Take one tablet by mouth daily, Disp: 90 tablet, Rfl: 3   omeprazole (PRILOSEC) 20 MG capsule, Take 1 capsule (20 mg total) by mouth daily., Disp: 90 capsule, Rfl: 0   tadalafil (CIALIS)  10 MG tablet, Take 1 tablet (10 mg total) by mouth daily as needed for erectile dysfunction., Disp: 30 tablet, Rfl: 6   tadalafil (CIALIS) 5 MG tablet, Take 1 tablet (5 mg total) by mouth daily., Disp: 90 tablet, Rfl: 3   testosterone (ANDROGEL) 50 MG/5GM (1%) GEL, Apply two packets daily to clean, dry and intact skin.  Avoid face and scrotum., Disp: 300 g, Rfl: 0  Allergies  Allergen Reactions   Sulfa Antibiotics Nausea Only   Doxycycline Other (See Comments) and Nausea And Vomiting    Patient Care Team: Towanda Malkin, MD as PCP - General (Internal Medicine) Su Hoff, [place] (Physician Assistant) Melida Quitter, PA (Physician Assistant) Coolidge Breeze, MD as Referring Physician (Urology)   Chart Review: I personally reviewed active problem list, medication list, allergies, family history, social history, health maintenance, notes from last encounter, lab results, imaging with the patient/caregiver today. Recent labs, specialists visits reviewed - more than 10 min spent on chart review and prep today pt new to me  Review of Systems  Constitutional: Negative.  Negative for activity change, appetite change, fatigue and unexpected weight change.  HENT: Negative.    Eyes: Negative.   Respiratory: Negative.  Negative for shortness of breath.   Cardiovascular: Negative.  Negative for chest pain, palpitations and leg swelling.  Gastrointestinal: Negative.  Negative for abdominal pain and blood in stool.  Endocrine: Negative.   Genitourinary:  Negative.  Negative for decreased urine volume, difficulty urinating, testicular pain and urgency.  Musculoskeletal: Negative.   Skin: Negative.  Negative for color change and pallor.  Allergic/Immunologic: Negative.   Neurological: Negative.  Negative for syncope, weakness, light-headedness and numbness.  Hematological: Negative.   Psychiatric/Behavioral: Negative.  Negative for confusion, dysphoric mood, self-injury and suicidal ideas. The patient is not nervous/anxious.   All other systems reviewed and are negative.        Objective:   Vitals:  Vitals:   09/04/20 1339  BP: 116/68  Pulse: 99  Resp: 16  Temp: 98.4 F (36.9 C)  SpO2: 94%  Weight: 244 lb 1.6 oz (110.7 kg)  Height: 5\' 11"  (1.803 m)    Body mass index is 34.05 kg/m.  Physical Exam Vitals and nursing note reviewed.  Constitutional:      General: He is not in acute distress.    Appearance: Normal appearance. He is well-developed. He is not ill-appearing, toxic-appearing or diaphoretic.     Interventions: Face mask in place.  HENT:     Head: Normocephalic and atraumatic.     Jaw: No trismus.     Right Ear: Tympanic membrane, ear canal and external ear normal.     Left Ear: Tympanic membrane, ear canal and external ear normal.     Nose: No mucosal edema or rhinorrhea.     Right Sinus: No maxillary sinus tenderness or frontal sinus tenderness.     Left Sinus: No maxillary sinus tenderness or frontal sinus tenderness.     Mouth/Throat:     Mouth: Mucous membranes are moist.     Pharynx: Uvula midline. No oropharyngeal exudate, posterior oropharyngeal erythema or uvula swelling.  Eyes:     General: Lids are normal. No scleral icterus.       Right eye: No discharge.        Left eye: No discharge.     Conjunctiva/sclera: Conjunctivae normal.     Pupils: Pupils are equal, round, and reactive to light.  Neck:     Thyroid: No  thyroid mass, thyromegaly or thyroid tenderness.     Trachea: Trachea and phonation  normal. No tracheal deviation.  Cardiovascular:     Rate and Rhythm: Normal rate and regular rhythm.     Pulses: Normal pulses.          Radial pulses are 2+ on the right side and 2+ on the left side.       Posterior tibial pulses are 2+ on the right side and 2+ on the left side.     Heart sounds: Normal heart sounds. No murmur heard.   No friction rub. No gallop.  Pulmonary:     Effort: Pulmonary effort is normal. No respiratory distress.     Breath sounds: Normal breath sounds. No stridor. No wheezing, rhonchi or rales.     Comments: Coarse BS at Left base Abdominal:     General: Bowel sounds are normal. There is no distension.     Palpations: Abdomen is soft.     Tenderness: There is no abdominal tenderness. There is no guarding or rebound.  Musculoskeletal:        General: Normal range of motion.     Cervical back: Normal range of motion and neck supple.     Right lower leg: No edema.     Left lower leg: No edema.  Skin:    General: Skin is warm and dry.     Capillary Refill: Capillary refill takes less than 2 seconds.     Coloration: Skin is not jaundiced.     Findings: No rash.     Nails: There is no clubbing.  Neurological:     Mental Status: He is alert and oriented to person, place, and time. Mental status is at baseline.     Cranial Nerves: No dysarthria or facial asymmetry.     Motor: No tremor or abnormal muscle tone.     Gait: Gait normal.  Psychiatric:        Mood and Affect: Mood normal.        Speech: Speech normal.        Behavior: Behavior normal. Behavior is cooperative.     No results found for this or any previous visit (from the past 2160 hour(s)).  Fall Risk: Fall Risk  09/04/2020 02/11/2020 08/12/2019 12/23/2018 08/24/2018  Falls in the past year? 0 0 0 0 0  Number falls in past yr: 0 0 0 0 0  Injury with Fall? 0 0 0 0 0  Risk for fall due to : - - - - -  Follow up - - - Falls evaluation completed -    Functional Status Survey: Is the patient  deaf or have difficulty hearing?: No Does the patient have difficulty seeing, even when wearing glasses/contacts?: No Does the patient have difficulty concentrating, remembering, or making decisions?: No Does the patient have difficulty walking or climbing stairs?: No Does the patient have difficulty dressing or bathing?: No Does the patient have difficulty doing errands alone such as visiting a doctor's office or shopping?: No   Assessment & Plan:    CPE completed today  Prostate cancer screening and PSA options (with potential risks and benefits of testing vs not testing) were discussed along with recent recs/guidelines, shared decision making and handout/information given to pt today  USPSTF grade A and B recommendations reviewed with patient; age-appropriate recommendations, preventive care, screening tests, etc discussed and encouraged; healthy living encouraged; see AVS for patient education given to patient  Discussed importance of 150 minutes  of physical activity weekly, AHA exercise recommendations given to pt in AVS/handout  Discussed importance of healthy diet:  eating lean meats and proteins, avoiding trans fats and saturated fats, avoid simple sugars and excessive carbs in diet, eat 6 servings of fruit/vegetables daily and drink plenty of water and avoid sweet beverages.  DASH diet reviewed if pt has HTN  Recommended pt to do annual eye exam and routine dental exams/cleanings  Reviewed Health Maintenance: Health Maintenance  Topic Date Due   Pneumococcal Vaccine 58-40 Years old (1 - PCV) Never done   Hepatitis C Screening  Never done   INFLUENZA VACCINE  09/25/2020   COLONOSCOPY (Pts 45-39yrs Insurance coverage will need to be confirmed)  10/12/2021   TETANUS/TDAP  12/19/2026   COVID-19 Vaccine  Completed   HIV Screening  Completed   HPV VACCINES  Aged Out    Immunizations: Immunization History  Administered Date(s) Administered   Influenza, Quadrivalent,  Recombinant, Inj, Pf 12/06/2018   Influenza,inj,Quad PF,6+ Mos 12/18/2016, 10/10/2017   Influenza-Unspecified 10/26/2013, 09/26/2015, 01/24/2020   Moderna Sars-Covid-2 Vaccination 05/05/2019, 06/02/2019, 01/24/2020   Tdap 03/15/2010, 12/18/2016     ICD-10-CM   1. Annual physical exam  Z00.00 Lipid panel    COMPLETE METABOLIC PANEL WITH GFR    Hemoglobin A1C    CBC w/Diff/Platelet    2. Metabolic syndrome  E99.37 COMPLETE METABOLIC PANEL WITH GFR    Hemoglobin A1C    Semaglutide,0.25 or 0.5MG /DOS, (OZEMPIC, 0.25 OR 0.5 MG/DOSE,) 2 MG/1.5ML SOPN    Semaglutide,0.25 or 0.5MG /DOS, (OZEMPIC, 0.25 OR 0.5 MG/DOSE,) 2 MG/1.5ML SOPN   past PCP attempted to get ozempic for tx    3. Insulin resistance  J69.67 COMPLETE METABOLIC PANEL WITH GFR    Hemoglobin A1C    Semaglutide,0.25 or 0.5MG /DOS, (OZEMPIC, 0.25 OR 0.5 MG/DOSE,) 2 MG/1.5ML SOPN    Semaglutide,0.25 or 0.5MG /DOS, (OZEMPIC, 0.25 OR 0.5 MG/DOSE,) 2 MG/1.5ML SOPN   restart ozempic - do PA discussed need for 4-6 month f/up if he restarts med    4. Essential hypertension  E93 COMPLETE METABOLIC PANEL WITH GFR   stable, well controlled on lisinopril, continue meds, diet/lifestyle efforts/DASH    5. Mixed hyperlipidemia  E78.2 Lipid panel    COMPLETE METABOLIC PANEL WITH GFR    atorvastatin (LIPITOR) 40 MG tablet   on statin- forgets med often, no SE or concerns, cholesterol elevated with last labs, due for labs today    6. Hypogonadism male  E29.1 CBC w/Diff/Platelet   on Testosterone supplement/replacement per Urology - recent labs and urology OV reviewed    7. Encounter for hepatitis C screening test for low risk patient  Z11.59 Hepatitis C antibody    8. Need for pneumococcal vaccination  Z23    smoker    9. Current smoker  F17.200    smoking cessation discussed and patched, meds, resources offered today    10. Gastroesophageal reflux disease without esophagitis  K21.9 omeprazole (PRILOSEC) 20 MG capsule   on prilosec daily     11. Class 1 obesity with serious comorbidity and body mass index (BMI) of 34.0 to 34.9 in adult, unspecified obesity type  E66.9 Semaglutide,0.25 or 0.5MG /DOS, (OZEMPIC, 0.25 OR 0.5 MG/DOSE,) 2 MG/1.5ML SOPN   Z68.34 Semaglutide,0.25 or 0.5MG /DOS, (OZEMPIC, 0.25 OR 0.5 MG/DOSE,) 2 MG/1.5ML SOPN     Smoking cessation instruction/counseling given:  counseled patient on the dangers of tobacco use, advised patient to stop smoking, and reviewed strategies to maximize success  Spent more than 4 min on smoking  cessation today    Delsa Grana, PA-C 09/04/20 2:13 PM  [facility]

## 2020-09-04 NOTE — Patient Instructions (Signed)
Preventive Care 40-48 Years Old, Male Preventive care refers to lifestyle choices and visits with your health care provider that can promote health and wellness. This includes: A yearly physical exam. This is also called an annual wellness visit. Regular dental and eye exams. Immunizations. Screening for certain conditions. Healthy lifestyle choices, such as: Eating a healthy diet. Getting regular exercise. Not using drugs or products that contain nicotine and tobacco. Limiting alcohol use. What can I expect for my preventive care visit? Physical exam Your health care provider will check your: Height and weight. These may be used to calculate your BMI (body mass index). BMI is a measurement that tells if you are at a healthy weight. Heart rate and blood pressure. Body temperature. Skin for abnormal spots. Counseling Your health care provider may ask you questions about your: Past medical problems. Family's medical history. Alcohol, tobacco, and drug use. Emotional well-being. Home life and relationship well-being. Sexual activity. Diet, exercise, and sleep habits. Work and work environment. Access to firearms. What immunizations do I need?  Vaccines are usually given at various ages, according to a schedule. Your health care provider will recommend vaccines for you based on your age, medicalhistory, and lifestyle or other factors, such as travel or where you work. What tests do I need? Blood tests Lipid and cholesterol levels. These may be checked every 5 years, or more often if you are over 50 years old. Hepatitis C test. Hepatitis B test. Screening Lung cancer screening. You may have this screening every year starting at age 55 if you have a 30-pack-year history of smoking and currently smoke or have quit within the past 15 years. Prostate cancer screening. Recommendations will vary depending on your family history and other risks. Genital exam to check for testicular cancer  or hernias. Colorectal cancer screening. All adults should have this screening starting at age 50 and continuing until age 75. Your health care provider may recommend screening at age 45 if you are at increased risk. You will have tests every 1-10 years, depending on your results and the type of screening test. Diabetes screening. This is done by checking your blood sugar (glucose) after you have not eaten for a while (fasting). You may have this done every 1-3 years. STD (sexually transmitted disease) testing, if you are at risk. Follow these instructions at home: Eating and drinking  Eat a diet that includes fresh fruits and vegetables, whole grains, lean protein, and low-fat dairy products. Take vitamin and mineral supplements as recommended by your health care provider. Do not drink alcohol if your health care provider tells you not to drink. If you drink alcohol: Limit how much you have to 0-2 drinks a day. Be aware of how much alcohol is in your drink. In the [place] one drink equals one 12 oz bottle of beer (355 mL), one 5 oz glass of wine (148 mL), or one 1 oz glass of hard liquor (44 mL).  Lifestyle Take daily care of your teeth and gums. Brush your teeth every morning and night with fluoride toothpaste. Floss one time each day. Stay active. Exercise for at least 30 minutes 5 or more days each week. Do not use any products that contain nicotine or tobacco, such as cigarettes, e-cigarettes, and chewing tobacco. If you need help quitting, ask your health care provider. Do not use drugs. If you are sexually active, practice safe sex. Use a condom or other form of protection to prevent STIs (sexually transmitted infections). If told by   your health care provider, take low-dose aspirin daily starting at age 50. Find healthy ways to cope with stress, such as: Meditation, yoga, or listening to music. Journaling. Talking to a trusted person. Spending time with friends and  family. Safety Always wear your seat belt while driving or riding in a vehicle. Do not drive: If you have been drinking alcohol. Do not ride with someone who has been drinking. When you are tired or distracted. While texting. Wear a helmet and other protective equipment during sports activities. If you have firearms in your house, make sure you follow all gun safety procedures. What's next? Go to your health care provider once a year for an annual wellness visit. Ask your health care provider how often you should have your eyes and teeth checked. Stay up to date on all vaccines. This information is not intended to replace advice given to you by your health care provider. Make sure you discuss any questions you have with your healthcare provider. Document Revised: 11/10/2018 Document Reviewed: 02/05/2018 Elsevier Patient Education  2022 Elsevier Inc.  

## 2020-09-04 NOTE — Addendum Note (Signed)
Addended by: Cathrine Muster C on: 09/04/2020 03:00 PM   Modules accepted: Orders

## 2020-09-05 LAB — CBC WITH DIFFERENTIAL/PLATELET
Absolute Monocytes: 700 cells/uL (ref 200–950)
Basophils Absolute: 74 cells/uL (ref 0–200)
Basophils Relative: 0.7 %
Eosinophils Absolute: 106 cells/uL (ref 15–500)
Eosinophils Relative: 1 %
HCT: 44.9 % (ref 38.5–50.0)
Hemoglobin: 15.3 g/dL (ref 13.2–17.1)
Lymphs Abs: 3487 cells/uL (ref 850–3900)
MCH: 30.7 pg (ref 27.0–33.0)
MCHC: 34.1 g/dL (ref 32.0–36.0)
MCV: 90.2 fL (ref 80.0–100.0)
MPV: 10.5 fL (ref 7.5–12.5)
Monocytes Relative: 6.6 %
Neutro Abs: 6233 cells/uL (ref 1500–7800)
Neutrophils Relative %: 58.8 %
Platelets: 273 10*3/uL (ref 140–400)
RBC: 4.98 10*6/uL (ref 4.20–5.80)
RDW: 13.4 % (ref 11.0–15.0)
Total Lymphocyte: 32.9 %
WBC: 10.6 10*3/uL (ref 3.8–10.8)

## 2020-09-05 LAB — LIPID PANEL
Cholesterol: 238 mg/dL — ABNORMAL HIGH (ref <200)
HDL: 28 mg/dL — ABNORMAL LOW (ref >=40)
Non-HDL Cholesterol (Calc): 210 mg/dL (calc) — ABNORMAL HIGH (ref <130)
Total CHOL/HDL Ratio: 8.5 (calc) — ABNORMAL HIGH (ref <5.0)
Triglycerides: 1161 mg/dL — ABNORMAL HIGH (ref <150)

## 2020-09-05 LAB — COMPLETE METABOLIC PANEL WITH GFR
AG Ratio: 2 (calc) (ref 1.0–2.5)
ALT: 17 U/L (ref 9–46)
AST: 40 U/L (ref 10–40)
Albumin: 4.3 g/dL (ref 3.6–5.1)
Alkaline phosphatase (APISO): 68 U/L (ref 36–130)
BUN: 19 mg/dL (ref 7–25)
CO2: 27 mmol/L (ref 20–32)
Calcium: 9.3 mg/dL (ref 8.6–10.3)
Chloride: 105 mmol/L (ref 98–110)
Creat: 1.06 mg/dL (ref 0.60–1.29)
Globulin: 2.2 g/dL (calc) (ref 1.9–3.7)
Glucose, Bld: 97 mg/dL (ref 65–139)
Potassium: 4.3 mmol/L (ref 3.5–5.3)
Sodium: 138 mmol/L (ref 135–146)
Total Bilirubin: 0.3 mg/dL (ref 0.2–1.2)
Total Protein: 6.5 g/dL (ref 6.1–8.1)
eGFR: 87 mL/min/{1.73_m2} (ref >=60)

## 2020-09-05 LAB — HEPATITIS C ANTIBODY
Hepatitis C Ab: NONREACTIVE
SIGNAL TO CUT-OFF: 0.26 (ref <1.00)

## 2020-09-05 LAB — HEMOGLOBIN A1C
Hgb A1c MFr Bld: 5.1 % of total Hgb (ref <5.7)
Mean Plasma Glucose: 100 mg/dL
eAG (mmol/L): 5.5 mmol/L

## 2020-09-06 DIAGNOSIS — E781 Pure hyperglyceridemia: Secondary | ICD-10-CM | POA: Insufficient documentation

## 2020-09-06 NOTE — Addendum Note (Signed)
Addended by: Delsa Grana on: 09/06/2020 07:16 PM   Modules accepted: Orders

## 2020-09-07 DIAGNOSIS — G4733 Obstructive sleep apnea (adult) (pediatric): Secondary | ICD-10-CM | POA: Diagnosis not present

## 2020-09-11 ENCOUNTER — Other Ambulatory Visit: Payer: Self-pay

## 2020-09-12 ENCOUNTER — Other Ambulatory Visit: Payer: Self-pay

## 2020-09-12 ENCOUNTER — Other Ambulatory Visit: Payer: BC Managed Care – PPO

## 2020-09-12 DIAGNOSIS — N400 Enlarged prostate without lower urinary tract symptoms: Secondary | ICD-10-CM

## 2020-09-13 LAB — TESTOSTERONE: Testosterone: 658 ng/dL (ref 264–916)

## 2020-09-13 LAB — HEMOGLOBIN AND HEMATOCRIT, BLOOD
Hematocrit: 43.1 % (ref 37.5–51.0)
Hemoglobin: 15.1 g/dL (ref 13.0–17.7)

## 2020-09-13 LAB — PSA: Prostate Specific Ag, Serum: 0.7 ng/mL (ref 0.0–4.0)

## 2020-09-14 ENCOUNTER — Ambulatory Visit: Payer: Self-pay | Admitting: Urology

## 2020-09-20 ENCOUNTER — Ambulatory Visit: Payer: BC Managed Care – PPO | Admitting: Dermatology

## 2020-09-24 ENCOUNTER — Encounter: Payer: Self-pay | Admitting: Family Medicine

## 2020-09-25 ENCOUNTER — Telehealth: Payer: Self-pay | Admitting: Family Medicine

## 2020-09-25 NOTE — Telephone Encounter (Signed)
This is note a [facility] patient. Please send to the correct office.

## 2020-09-25 NOTE — Telephone Encounter (Signed)
Pt is calling to check to see if there has been a response to his request in MyChart for medication for COVID. Please advise [phone]

## 2020-09-26 ENCOUNTER — Telehealth (INDEPENDENT_AMBULATORY_CARE_PROVIDER_SITE_OTHER): Payer: BC Managed Care – PPO | Admitting: Unknown Physician Specialty

## 2020-09-26 ENCOUNTER — Other Ambulatory Visit: Payer: Self-pay

## 2020-09-26 ENCOUNTER — Encounter: Payer: Self-pay | Admitting: Unknown Physician Specialty

## 2020-09-26 ENCOUNTER — Ambulatory Visit: Payer: Self-pay | Admitting: Urology

## 2020-09-26 DIAGNOSIS — U071 COVID-19: Secondary | ICD-10-CM

## 2020-09-26 NOTE — Progress Notes (Signed)
   There were no vitals taken for this visit.   Subjective:    Patient ID: Jeffery [name], male    DOB: 1970-08-04, 50 y.o.   MRN: [id number]  HPI: Jeffery [name] is a 50 y.o. male  Chief Complaint  Patient presents with   Covid Positive   URI  This is a new problem. Episode onset: sx onset 5 days ago. The problem has been rapidly improving. Associated symptoms include congestion, coughing, joint pain and rhinorrhea. Pertinent negatives include no headaches, sore throat or wheezing. He has tried nothing for the symptoms.   Relevant past medical, surgical, family and social history reviewed and updated as indicated. Interim medical history since our last visit reviewed. Allergies and medications reviewed and updated.  Review of Systems  HENT:  Positive for congestion and rhinorrhea. Negative for sore throat.   Respiratory:  Positive for cough. Negative for wheezing.   Musculoskeletal:  Positive for joint pain.  Neurological:  Negative for headaches.   Per HPI unless specifically indicated above     Objective:    There were no vitals taken for this visit.  Wt Readings from Last 3 Encounters:  09/04/20 244 lb 1.6 oz (110.7 kg)  03/14/20 231 lb (104.8 kg)  02/11/20 237 lb 4.8 oz (107.6 kg)    Physical Exam Constitutional:      General: He is not in acute distress.    Appearance: Normal appearance. He is well-developed.  HENT:     Head: Normocephalic and atraumatic.  Eyes:     General: Lids are normal. No scleral icterus.       Right eye: No discharge.        Left eye: No discharge.     Conjunctiva/sclera: Conjunctivae normal.  Cardiovascular:     Rate and Rhythm: Normal rate.  Pulmonary:     Effort: Pulmonary effort is normal.  Abdominal:     Palpations: There is no hepatomegaly or splenomegaly.  Musculoskeletal:        General: Normal range of motion.  Skin:    Coloration: Skin is not pale.     Findings: No rash.  Neurological:     Mental Status: He is alert  and oriented to person, place, and time.  Psychiatric:        Behavior: Behavior normal.        Thought Content: Thought content normal.        Judgment: Judgment normal.    Results for orders placed or performed in visit on 09/12/20  Hemoglobin and hematocrit, blood  Result Value Ref Range   Hemoglobin 15.1 13.0 - 17.7 g/dL   Hematocrit 43.1 37.5 - 51.0 %  PSA  Result Value Ref Range   Prostate Specific Ag, Serum 0.7 0.0 - 4.0 ng/mL  Testosterone  Result Value Ref Range   Testosterone 658 264 - 916 ng/dL      Assessment & Plan:   Problem List Items Addressed This Visit   None Visit Diagnoses     COVID-19    -  Primary   Pt with reapidly resolving sxs.  Quarantine information given.  Elected not to take Paxlovid with sxs resolving       Supportive care reviewed  Follow up plan: Return if symptoms worsen or fail to improve.

## 2020-10-23 ENCOUNTER — Telehealth: Payer: Self-pay

## 2020-10-23 NOTE — Telephone Encounter (Signed)
Incoming call from pt on triage line who states that he received a letter from his insurance company stating that they have requested a PA from [place] and we have not responded.

## 2020-10-23 NOTE — Telephone Encounter (Signed)
Spoke with patient and he states the pharmacy been sending PA to [place]? Pt states he filled testosterone last month from [place] and it needs prior auth. I advised pt the last fill we did was 08/24/2020. It looks like [facility] also sent refill on that day. Pt is confused between [facility] and [facility] and will check the box when he gets home.

## 2020-10-24 ENCOUNTER — Ambulatory Visit: Payer: Self-pay | Admitting: Urology

## 2020-11-02 NOTE — Progress Notes (Signed)
11/03/2020 4:23 PM   Jeffery [name] [birth year] [id number]  Referring provider: Towanda Malkin, MD [address] [place],  [place] [address]  Urological history: 1. BPH with LU TS -TUMT~ 5 years ago  -PSA 0.7 ng/mL in 08/2020 -I PSS 6/1 -taking tamsulosin 0.4 mg prn and tadalafil 5 mg daily  2. Testosterone deficiency -contributing factors of age, diabetes, chronic pain medications and obesity - applying AndroGel '50mg'$ /5g, 2 packets daily - testosterone level 658 ng/dL in 08/2020 - HCT and HBG WNL in 08/2020  3. Prostate nodule - PSA 0.7  - Prior prostate biopsy with Dr. Yves Dill around 2014 for a prostate nodule  4. ED -contributing factors of age, testosterone deficiency, diabetes, sleep apnea, obesity, HTN, BPH and smoking - SHIM 24 - taking tadalafil 10 mg on demand dosing   HPI: Jeffery [name] is a 50 y.o. male who presents today for follow up.   No issues with urination.  Patient denies any modifying or aggravating factors.  Patient denies any gross hematuria, dysuria or suprapubic/flank pain.  Patient denies any fevers, chills, nausea or vomiting.    UA benign   IPSS     Row Name 11/03/20 0800         International Prostate Symptom Score   How often have you had the sensation of not emptying your bladder? Less than 1 in 5     How often have you had to urinate less than every two hours? Less than 1 in 5 times     How often have you found you stopped and started again several times when you urinated? Less than half the time     How often have you found it difficult to postpone urination? Less than 1 in 5 times     How often have you had a weak urinary stream? Not at All     How often have you had to strain to start urination? Not at All     How many times did you typically get up at night to urinate? 1 Time     Total IPSS Score 6           Quality of Life due to urinary symptoms   If you were to spend the rest of your life with your urinary  condition just the way it is now how would you feel about that? Pleased               Score:  1-7 Mild 8-19 Moderate 20-35 Severe   Patient still having spontaneous erections.    He denies any pain or curvature with erections.     SHIM     Row Name 11/03/20 0834         SHIM: Over the last 6 months:   How do you rate your confidence that you could get and keep an erection? High     When you had erections with sexual stimulation, how often were your erections hard enough for penetration (entering your partner)? Almost Always or Always     During sexual intercourse, how often were you able to maintain your erection after you had penetrated (entered) your partner? Almost Always or Always     During sexual intercourse, how difficult was it to maintain your erection to completion of intercourse? Not Difficult     When you attempted sexual intercourse, how often was it satisfactory for you? Almost Always or Always  SHIM Total Score   SHIM 24              Score: 1-7 Severe ED 8-11 Moderate ED 12-16 Mild-Moderate ED 17-21 Mild ED 22-25 No ED   PMH: Past Medical History:  Diagnosis Date   Dental crowns present    sides and back   Dyslipidemia    Enlarged prostate    Epididymitis    Headache    sinus   Hypotestosteronism    Lower urinary tract symptoms (LUTS) 06/14/2016   Nodular prostate with lower urinary tract symptoms    Obesity    Testosterone deficiency    Umbilical hernia    Umbilical hernia without obstruction or gangrene     Surgical History: Past Surgical History:  Procedure Laterality Date   HERNIA REPAIR  [id number]   umbilical hernia   NASAL SEPTOPLASTY W/ TURBINOPLASTY Bilateral 11/23/2014   Procedure: NASAL SEPTOPLASTY WITH TURBINATE REDUCTION;  Surgeon: Carloyn Manner, MD;  Location: [facility];  Service: ENT;  Laterality: Bilateral;   PROSTATE BIOPSY  2015   ROOT CANAL     WRIST FRACTURE SURGERY Right     Home  Medications:  Allergies as of 11/03/2020       Reactions   Sulfa Antibiotics Nausea Only   Doxycycline Other (See Comments), Nausea And Vomiting        Medication List        Accurate as of November 03, 2020 11:59 PM. If you have any questions, ask your nurse or doctor.          atorvastatin 40 MG tablet Commonly known as: LIPITOR TAKE 1 TABLET(40 MG) BY MOUTH DAILY   lisinopril 10 MG tablet Commonly known as: ZESTRIL Take one tablet by mouth daily   omeprazole 20 MG capsule Commonly known as: PRILOSEC Take 1 capsule (20 mg total) by mouth daily.   Ozempic (0.25 or 0.5 MG/DOSE) 2 MG/1.5ML Sopn Generic drug: Semaglutide(0.25 or 0.'5MG'$ /DOS) Inject 0.5 mg into the skin once a week.   tadalafil 5 MG tablet Commonly known as: CIALIS Take 1 tablet (5 mg total) by mouth daily.   tadalafil 10 MG tablet Commonly known as: Cialis Take 1 tablet (10 mg total) by mouth daily as needed for erectile dysfunction.   testosterone 50 MG/5GM (1%) Gel Commonly known as: ANDROGEL Apply two packets daily to clean, dry and intact skin.  Avoid face and scrotum.        Allergies:  Allergies  Allergen Reactions   Sulfa Antibiotics Nausea Only   Doxycycline Other (See Comments) and Nausea And Vomiting    Family History: Family History  Problem Relation Age of Onset   Diabetes Mother    Cancer Mother        Colon Cancer   Hypertension Father    Cancer Maternal Grandmother        Lung-  Non-Smoker    Social History:  reports that he has been smoking cigarettes and e-cigarettes. He has a 30.00 pack-year smoking history. He has never used smokeless tobacco. He reports that he does not drink alcohol and does not use drugs.  ROS: Pertinent ROS in HPI  Physical Exam: BP 114/77   Pulse 80   Ht '5\' 11"'$  (1.803 m)   Wt 244 lb (110.7 kg)   BMI 34.03 kg/m   Constitutional:  Well nourished. Alert and oriented, No acute distress. HEENT: [place] AT, mask in place.  Trachea  midline Cardiovascular: No clubbing, cyanosis, or edema. Respiratory: Normal respiratory effort,  no increased work of breathing. GU: No CVA tenderness.  No bladder fullness or masses.  Patient with uncircumcised phallus.  Foreskin easily retracted  Urethral meatus is patent.  No penile discharge. No penile lesions or rashes. Scrotum without lesions, cysts, rashes and/or edema.  Testicles are located scrotally bilaterally. No masses are appreciated in the testicles. Left and right epididymis are normal. Rectal: Patient with  normal sphincter tone. Anus and perineum without scarring or rashes. No rectal masses are appreciated. Prostate is approximately 50 grams, no nodules are appreciated. Seminal vesicles could not be palpated Neurologic: Grossly intact, no focal deficits, moving all 4 extremities. Psychiatric: Normal mood and affect.   Laboratory Data: Lab Results  Component Value Date   WBC 10.6 09/04/2020   HGB 15.1 09/12/2020   HCT 43.1 09/12/2020   MCV 90.2 09/04/2020   PLT 273 09/04/2020    Lab Results  Component Value Date   CREATININE 1.06 09/04/2020    Lab Results  Component Value Date   TESTOSTERONE 658 09/12/2020    Lab Results  Component Value Date   TSH 0.94 02/11/2020       Component Value Date/Time   CHOL 238 (H) 09/04/2020 1437   HDL 28 (L) 09/04/2020 1437   CHOLHDL 8.5 (H) 09/04/2020 1437   VLDL 36 (H) 05/17/2016 0816   LDLCALC  09/04/2020 1437     Comment:     . LDL cholesterol not calculated. Triglyceride levels greater than 400 mg/dL invalidate calculated LDL results. . Reference range: <100 . Desirable range <100 mg/dL for primary prevention;   <70 mg/dL for patients with CHD or diabetic patients  with > or = 2 CHD risk factors. Marland Kitchen LDL-C is now calculated using the Martin-Hopkins  calculation, which is a validated novel method providing  better accuracy than the Friedewald equation in the  estimation of LDL-C.  Cresenciano Genre et al. Annamaria Helling.  [id number]): [id number]  (http://education.[facility].com/faq/FAQ164)     Lab Results  Component Value Date   AST 40 09/04/2020   Lab Results  Component Value Date   ALT 17 09/04/2020    Urinalysis Component     Latest Ref Rng & Units 11/03/2020  Specific Gravity, UA     1.005 - 1.030 1.010  pH, UA     5.0 - 7.5 6.0  Color, UA     Yellow Yellow  Appearance Ur     Clear Clear  Leukocytes,UA     Negative Negative  Protein,UA     Negative/Trace Negative  Glucose, UA     Negative Negative  Ketones, UA     Negative Negative  RBC, UA     Negative Negative  Bilirubin, UA     Negative Negative  Urobilinogen, Ur     0.2 - 1.0 mg/dL 0.2  Nitrite, UA     Negative Negative  Microscopic Examination      See below:   Component     Latest Ref Rng & Units 11/03/2020  WBC, UA     0 - 5 /hpf None seen  RBC     0 - 2 /hpf 0-2  Epithelial Cells (non renal)     0 - 10 /hpf None seen  Bacteria, UA     None seen/Few None seen   I have reviewed the labs.   Pertinent Imaging: No recent imaging  Assessment & Plan:    1. Testosterone deficiency -Testosterone at therapeutic levels -Continue AndroGel 1% gel, 2 packets daily - refills sent to [facility]  in [place]  2. BPH with LUTS -PSA stable -DRE benign -UA benign -PVR < 300 cc -symptoms - None -continue conservative management, avoiding bladder irritants and timed voiding's -Continue tamsulosin 0.4 mg daily  3. Prostate nodule -PSA stable -DRE benign  4. ED -continue tadalafil 10 mg, on-demand-dosing   Return in about 6 months (around 05/03/2021) for PSA, testosterone, H & H, IPSS, SHIM and exam.  These notes generated with voice recognition software. I apologize for typographical errors.  Zara Council, PA-C  [facility] [address]  [place], [place] [address] [phone]

## 2020-11-03 ENCOUNTER — Encounter: Payer: Self-pay | Admitting: Urology

## 2020-11-03 ENCOUNTER — Other Ambulatory Visit: Payer: Self-pay

## 2020-11-03 ENCOUNTER — Ambulatory Visit (INDEPENDENT_AMBULATORY_CARE_PROVIDER_SITE_OTHER): Payer: BC Managed Care – PPO | Admitting: Urology

## 2020-11-03 VITALS — BP 114/77 | HR 80 | Ht 71.0 in | Wt 244.0 lb

## 2020-11-03 DIAGNOSIS — N400 Enlarged prostate without lower urinary tract symptoms: Secondary | ICD-10-CM

## 2020-11-03 DIAGNOSIS — E349 Endocrine disorder, unspecified: Secondary | ICD-10-CM | POA: Diagnosis not present

## 2020-11-03 DIAGNOSIS — N529 Male erectile dysfunction, unspecified: Secondary | ICD-10-CM | POA: Diagnosis not present

## 2020-11-03 DIAGNOSIS — N402 Nodular prostate without lower urinary tract symptoms: Secondary | ICD-10-CM | POA: Diagnosis not present

## 2020-11-03 LAB — URINALYSIS, COMPLETE
Bilirubin, UA: NEGATIVE
Glucose, UA: NEGATIVE
Ketones, UA: NEGATIVE
Leukocytes,UA: NEGATIVE
Nitrite, UA: NEGATIVE
Protein,UA: NEGATIVE
RBC, UA: NEGATIVE
Specific Gravity, UA: 1.01 (ref 1.005–1.030)
Urobilinogen, Ur: 0.2 mg/dL (ref 0.2–1.0)
pH, UA: 6 (ref 5.0–7.5)

## 2020-11-03 LAB — MICROSCOPIC EXAMINATION
Bacteria, UA: NONE SEEN
Epithelial Cells (non renal): NONE SEEN /hpf (ref 0–10)
WBC, UA: NONE SEEN /hpf (ref 0–5)

## 2020-11-03 MED ORDER — TESTOSTERONE 50 MG/5GM (1%) TD GEL: TRANSDERMAL | 5 refills | Status: DC

## 2020-12-08 DIAGNOSIS — G4733 Obstructive sleep apnea (adult) (pediatric): Secondary | ICD-10-CM | POA: Diagnosis not present

## 2020-12-31 ENCOUNTER — Encounter: Payer: Self-pay | Admitting: Emergency Medicine

## 2020-12-31 ENCOUNTER — Ambulatory Visit
Admission: EM | Admit: 2020-12-31 | Discharge: 2020-12-31 | Disposition: A | Discharge status: Home / Self Care | Payer: BLUE CROSS/BLUE SHIELD | Attending: Emergency Medicine | Admitting: Emergency Medicine

## 2020-12-31 ENCOUNTER — Other Ambulatory Visit: Payer: Self-pay

## 2020-12-31 DIAGNOSIS — J09X2 Influenza due to identified novel influenza A virus with other respiratory manifestations: Secondary | ICD-10-CM | POA: Diagnosis not present

## 2020-12-31 LAB — RAPID INFLUENZA A&B ANTIGENS
Influenza A (ARMC): POSITIVE — AB
Influenza B (ARMC): NEGATIVE

## 2020-12-31 MED ORDER — OSELTAMIVIR PHOSPHATE 75 MG PO CAPS: 75.0000 mg | ORAL_CAPSULE | Freq: Two times a day (BID) | ORAL | 0 refills | Status: DC

## 2020-12-31 MED ORDER — PROMETHAZINE-DM 6.25-15 MG/5ML PO SYRP: 5.0000 mL | ORAL_SOLUTION | Freq: Four times a day (QID) | ORAL | 0 refills | Status: DC | PRN

## 2020-12-31 MED ORDER — BENZONATATE 100 MG PO CAPS: 200.0000 mg | ORAL_CAPSULE | Freq: Three times a day (TID) | ORAL | 0 refills | Status: DC

## 2020-12-31 MED ORDER — IPRATROPIUM BROMIDE 0.06 % NA SOLN: 2.0000 | Freq: Four times a day (QID) | NASAL | 12 refills | Status: DC

## 2020-12-31 NOTE — Discharge Instructions (Signed)
Take the Tamiflu twice daily for 5 days for treatment of influenza.  Use the Atrovent nasal spray, 2 squirts up each nostril every 6 hours, as needed for nasal congestion and runny nose.  Use over-the-counter Tylenol and Ibuprofen as needed for body aches and fever.   Use the Tessalon Perles every 8 hours as needed for cough.  Taken with a small sip of water.  You may experience some numbness to your tongue or metallic taste in her mouth, this is normal.  Use the Promethazine DM cough syrup at bedtime as will make you drowsy but it should help dry up your postnasal drip and aid you in sleep and cough relief.  Return for reevaluation, or see your primary care provider, for new or worsening symptoms.

## 2020-12-31 NOTE — ED Provider Notes (Signed)
[facility]    CSN: [id number] Arrival date & time: 12/31/20  9450      History   Chief Complaint Chief Complaint  Patient presents with   Cough   Nasal Congestion   Generalized Body Aches    HPI Jeffery [name] is a 50 y.o. male.   HPI  50 year old male here for evaluation of respiratory complaints.  Patient reports that for the last 2 days has been experiencing subjective fever, nasal congestion with sinus pressure, sweats, productive cough for some yellow sputum as well as posttussive emesis, fatigue, and body aches.  He denies any ear pain, sore throat, shortness of breath or wheezing, or GI complaints.  Patient reports that he was around his granddaughters at [facility] and they are both ill but he is unsure of what they have.  Past Medical History:  Diagnosis Date   Dental crowns present    sides and back   Dyslipidemia    Enlarged prostate    Epididymitis    Headache    sinus   Hypotestosteronism    Lower urinary tract symptoms (LUTS) 06/14/2016   Nodular prostate with lower urinary tract symptoms    Obesity    Testosterone deficiency    Umbilical hernia    Umbilical hernia without obstruction or gangrene     Patient Active Problem List   Diagnosis Date Noted   Hypertriglyceridemia 09/06/2020   Scalp cyst 02/11/2020   Family history of colon cancer in mother 08/12/2019   Obstructive sleep apnea syndrome 08/12/2019   Current smoker 08/12/2019   Insulin resistance 38/88/2800   Metabolic syndrome 34/91/7915   Gastroesophageal reflux disease without esophagitis 03/17/2018   Other chest pain 03/16/2018   Mixed hyperlipidemia 03/16/2018   Essential hypertension 12/22/2017   Tobacco abuse counseling 10/10/2017   Biceps tendon rupture, right, subsequent encounter 03/20/2017   Hypogonadism male 09/19/2016   Family history of type 2 diabetes mellitus in mother 04/05/2016   Urinary incontinence 03/18/2016   Benign prostatic hyperplasia with urinary  obstruction 03/10/2015   Dyslipidemia 03/10/2015   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   Opioid type dependence, abuse ([place]) 03/10/2015   Class 1 obesity with serious comorbidity and body mass index (BMI) of 34.0 to 34.9 in adult 03/10/2015   Encounter for annual physical exam 12/23/2014   History of nasal septoplasty 11/23/2014   Chronic prostatitis 10/21/2014   Overweight (BMI 25.0-29.9) 05/69/7948   Umbilical hernia 01/65/5374    Past Surgical History:  Procedure Laterality Date   HERNIA REPAIR  10/21/05   umbilical hernia   NASAL SEPTOPLASTY W/ TURBINOPLASTY Bilateral 11/23/2014   Procedure: NASAL SEPTOPLASTY WITH TURBINATE REDUCTION;  Surgeon: Carloyn Manner, MD;  Location: [facility];  Service: ENT;  Laterality: Bilateral;   PROSTATE BIOPSY  2015   ROOT CANAL     WRIST FRACTURE SURGERY Right        Home Medications    Prior to Admission medications   Medication Sig Start Date End Date Taking? Authorizing Provider  atorvastatin (LIPITOR) 40 MG tablet TAKE 1 TABLET(40 MG) BY MOUTH DAILY 09/04/20  Yes Delsa Grana, PA-C  benzonatate (TESSALON) 100 MG capsule Take 2 capsules (200 mg total) by mouth every 8 (eight) hours. 12/31/20  Yes Margarette Canada, NP  ipratropium (ATROVENT) 0.06 % nasal spray Place 2 sprays into both nostrils 4 (four) times daily. 12/31/20  Yes Margarette Canada, NP  lisinopril (ZESTRIL) 10 MG tablet Take one tablet by mouth daily 08/08/20  Yes Delsa Grana, PA-C  omeprazole (PRILOSEC) 20 MG capsule Take 1 capsule (20 mg total) by mouth daily. 09/04/20  Yes Delsa Grana, PA-C  oseltamivir (TAMIFLU) 75 MG capsule Take 1 capsule (75 mg total) by mouth every 12 (twelve) hours. 12/31/20  Yes Margarette Canada, NP  promethazine-dextromethorphan (PROMETHAZINE-DM) 6.25-15 MG/5ML syrup Take 5 mLs by mouth 4 (four) times daily as needed. 12/31/20  Yes Margarette Canada, NP  tadalafil (CIALIS) 5 MG tablet Take 1 tablet (5 mg total) by mouth daily. 03/14/20  Yes McGowan, Larene Beach A,  PA-C  testosterone (ANDROGEL) 50 MG/5GM (1%) GEL Apply two packets daily to clean, dry and intact skin.  Avoid face and scrotum. 11/03/20  Yes McGowan, Larene Beach A, PA-C  tadalafil (CIALIS) 10 MG tablet Take 1 tablet (10 mg total) by mouth daily as needed for erectile dysfunction. 03/14/20   Nori Riis, PA-C    Family History Family History  Problem Relation Age of Onset   Diabetes Mother    Cancer Mother        Colon Cancer   Hypertension Father    Cancer Maternal Grandmother        Lung-  Non-Smoker    Social History Social History   Tobacco Use   Smoking status: Every Day    Packs/day: 1.00    Years: 30.00    Pack years: 30.00    Types: Cigarettes, E-cigarettes   Smokeless tobacco: Never  Vaping Use   Vaping Use: Every day  Substance Use Topics   Alcohol use: No    Alcohol/week: 0.0 standard drinks   Drug use: No     Allergies   Sulfa antibiotics and Doxycycline   Review of Systems Review of Systems  Constitutional:  Positive for fatigue and fever. Negative for activity change and appetite change.  HENT:  Positive for congestion, rhinorrhea and sinus pressure. Negative for ear pain and sore throat.   Respiratory:  Positive for cough. Negative for shortness of breath and wheezing.   Gastrointestinal:  Negative for diarrhea, nausea and vomiting.  Musculoskeletal:  Positive for arthralgias and myalgias.  Skin:  Negative for rash.  Neurological:  Positive for headaches.  Hematological: Negative.   Psychiatric/Behavioral: Negative.      Physical Exam Triage Vital Signs ED Triage Vitals  Enc Vitals Group     BP 12/31/20 1106 (!) 113/91     Pulse Rate 12/31/20 1106 (!) 102     Resp 12/31/20 1106 16     Temp 12/31/20 1106 98.6 F (37 C)     Temp Source 12/31/20 1106 Oral     SpO2 12/31/20 1106 97 %     Weight 12/31/20 1103 245 lb (111.1 kg)     Height 12/31/20 1103 5\' 11"  (1.803 m)     Head Circumference --      Peak Flow --      Pain Score 12/31/20  1103 8     Pain Loc --      Pain Edu? --      Excl. in [place]? --    No data found.  Updated Vital Signs BP (!) 113/91 (BP Location: Left Arm)   Pulse (!) 102   Temp 98.6 F (37 C) (Oral)   Resp 16   Ht 5\' 11"  (1.803 m)   Wt 245 lb (111.1 kg)   SpO2 97%   BMI 34.17 kg/m   Visual Acuity Right Eye Distance:   Left Eye Distance:   Bilateral Distance:    Right Eye Near:   Left  Eye Near:    Bilateral Near:     Physical Exam Vitals and nursing note reviewed.  Constitutional:      General: He is not in acute distress.    Appearance: Normal appearance. He is ill-appearing.  HENT:     Head: Normocephalic and atraumatic.     Right Ear: Tympanic membrane, ear canal and external ear normal. There is no impacted cerumen.     Left Ear: Tympanic membrane, ear canal and external ear normal. There is no impacted cerumen.     Nose: Congestion and rhinorrhea present.     Mouth/Throat:     Mouth: Mucous membranes are moist.     Pharynx: Oropharynx is clear. Posterior oropharyngeal erythema present.  Cardiovascular:     Rate and Rhythm: Normal rate and regular rhythm.     Pulses: Normal pulses.     Heart sounds: Normal heart sounds. No murmur heard.   No gallop.  Pulmonary:     Effort: Pulmonary effort is normal.     Breath sounds: Normal breath sounds. No wheezing, rhonchi or rales.  Musculoskeletal:     Cervical back: Normal range of motion and neck supple.  Lymphadenopathy:     Cervical: No cervical adenopathy.  Skin:    General: Skin is warm and dry.     Capillary Refill: Capillary refill takes less than 2 seconds.     Findings: No erythema or rash.  Neurological:     General: No focal deficit present.     Mental Status: He is alert and oriented to person, place, and time.  Psychiatric:        Mood and Affect: Mood normal.        Behavior: Behavior normal.        Thought Content: Thought content normal.        Judgment: Judgment normal.     UC Treatments / Results   Labs (all labs ordered are listed, but only abnormal results are displayed) Labs Reviewed  RAPID INFLUENZA A&B ANTIGENS - Abnormal; Notable for the following components:      Result Value   Influenza A ([facility]) POSITIVE (*)    All other components within normal limits    EKG   Radiology No results found.  Procedures Procedures (including critical care time)  Medications Ordered in UC Medications - No data to display  Initial Impression / Assessment and Plan / UC Course  I have reviewed the triage vital signs and the nursing notes.  Pertinent labs & imaging results that were available during my care of the patient were reviewed by me and considered in my medical decision making (see chart for details).  Patient is a pleasant but ill-appearing 50 year old male here for evaluation of respiratory complaints as outlined in HPI above.  Patient's physical exam reveals  Tympanic membrane's bilaterally with normal light reflex and clear external auditory canals.  Nasal mucosa is erythematous and edematous with clear nasal discharge in both nares.  Oropharyngeal exam reveals mild posterior oropharyngeal erythema with clear postnasal drip.  No cervical lymphadenopathy appreciated on exam.  Cardiopulmonary exam reveals clear lung sounds in all fields.  Patient was offered influenza triage and is positive for influenza A.  Will discharge patient home on Tamiflu twice daily for 5 days and will prescribe Atrovent nasal spray to help with nasal congestion and sinus pressure, Tessalon Perles and Promethazine DM cough syrup With cough and congestion.  Work note provided.   Final Clinical Impressions(s) / UC Diagnoses  Final diagnoses:  Influenza due to identified novel influenza A virus with other respiratory manifestations     Discharge Instructions      Take the Tamiflu twice daily for 5 days for treatment of influenza.  Use the Atrovent nasal spray, 2 squirts up each nostril every 6  hours, as needed for nasal congestion and runny nose.  Use over-the-counter Tylenol and Ibuprofen as needed for body aches and fever.   Use the Tessalon Perles every 8 hours as needed for cough.  Taken with a small sip of water.  You may experience some numbness to your tongue or metallic taste in her mouth, this is normal.  Use the Promethazine DM cough syrup at bedtime as will make you drowsy but it should help dry up your postnasal drip and aid you in sleep and cough relief.  Return for reevaluation, or see your primary care provider, for new or worsening symptoms.      ED Prescriptions     Medication Sig Dispense Auth. Provider   oseltamivir (TAMIFLU) 75 MG capsule Take 1 capsule (75 mg total) by mouth every 12 (twelve) hours. 10 capsule Margarette Canada, NP   benzonatate (TESSALON) 100 MG capsule Take 2 capsules (200 mg total) by mouth every 8 (eight) hours. 21 capsule Margarette Canada, NP   ipratropium (ATROVENT) 0.06 % nasal spray Place 2 sprays into both nostrils 4 (four) times daily. 15 mL Margarette Canada, NP   promethazine-dextromethorphan (PROMETHAZINE-DM) 6.25-15 MG/5ML syrup Take 5 mLs by mouth 4 (four) times daily as needed. 118 mL Margarette Canada, NP      PDMP not reviewed this encounter.   Margarette Canada, NP 12/31/20 1142

## 2020-12-31 NOTE — ED Triage Notes (Signed)
Patient c/o sinus pressure, cough, nasal congestion, and bodyaches that started on Friday.  Patient did a home covid test yesterday and was negative.  Patient unsure of fevers.

## 2021-01-08 ENCOUNTER — Telehealth: Payer: Self-pay

## 2021-01-08 DIAGNOSIS — G4733 Obstructive sleep apnea (adult) (pediatric): Secondary | ICD-10-CM | POA: Diagnosis not present

## 2021-01-08 NOTE — Telephone Encounter (Signed)
Temple from [facility] [phone]) called stating to send PA for clinical review she needs a copy of patient's recent testosterone labs faxed in. It was explained that patient has been on therapy since 10/21 and this is a continuation of therapy so current levels are normal. Testosterone labs were faxed most recent and previous including prior to starting therapy, for PA review to fax#[phone] Case ID #[id number]

## 2021-02-02 ENCOUNTER — Ambulatory Visit: Payer: BC Managed Care – PPO | Admitting: Family Medicine

## 2021-02-07 DIAGNOSIS — G4733 Obstructive sleep apnea (adult) (pediatric): Secondary | ICD-10-CM | POA: Diagnosis not present

## 2021-02-09 ENCOUNTER — Telehealth: Payer: BC Managed Care – PPO | Admitting: Nurse Practitioner

## 2021-02-27 ENCOUNTER — Telehealth: Payer: Self-pay | Admitting: *Deleted

## 2021-02-27 NOTE — Telephone Encounter (Signed)
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

## 2021-03-06 DIAGNOSIS — G4733 Obstructive sleep apnea (adult) (pediatric): Secondary | ICD-10-CM | POA: Diagnosis not present

## 2021-03-20 ENCOUNTER — Other Ambulatory Visit: Payer: Self-pay | Admitting: Urology

## 2021-03-20 DIAGNOSIS — E349 Endocrine disorder, unspecified: Secondary | ICD-10-CM

## 2021-03-27 DIAGNOSIS — G4733 Obstructive sleep apnea (adult) (pediatric): Secondary | ICD-10-CM | POA: Diagnosis not present

## 2021-04-06 DIAGNOSIS — G4733 Obstructive sleep apnea (adult) (pediatric): Secondary | ICD-10-CM | POA: Diagnosis not present

## 2021-04-24 DIAGNOSIS — G4733 Obstructive sleep apnea (adult) (pediatric): Secondary | ICD-10-CM | POA: Diagnosis not present

## 2021-05-03 ENCOUNTER — Other Ambulatory Visit: Payer: Self-pay

## 2021-05-03 ENCOUNTER — Other Ambulatory Visit: Payer: BC Managed Care – PPO

## 2021-05-03 DIAGNOSIS — E349 Endocrine disorder, unspecified: Secondary | ICD-10-CM

## 2021-05-03 DIAGNOSIS — N400 Enlarged prostate without lower urinary tract symptoms: Secondary | ICD-10-CM

## 2021-05-04 ENCOUNTER — Other Ambulatory Visit: Payer: BC Managed Care – PPO

## 2021-05-04 DIAGNOSIS — G4733 Obstructive sleep apnea (adult) (pediatric): Secondary | ICD-10-CM | POA: Diagnosis not present

## 2021-05-04 LAB — HEMOGLOBIN AND HEMATOCRIT, BLOOD
Hematocrit: 42.9 % (ref 37.5–51.0)
Hemoglobin: 14.9 g/dL (ref 13.0–17.7)

## 2021-05-04 LAB — TESTOSTERONE: Testosterone: 469 ng/dL (ref 264–916)

## 2021-05-04 LAB — PSA: Prostate Specific Ag, Serum: 0.7 ng/mL (ref 0.0–4.0)

## 2021-05-08 ENCOUNTER — Ambulatory Visit (INDEPENDENT_AMBULATORY_CARE_PROVIDER_SITE_OTHER): Payer: BC Managed Care – PPO | Admitting: Urology

## 2021-05-08 ENCOUNTER — Encounter: Payer: Self-pay | Admitting: Urology

## 2021-05-08 ENCOUNTER — Other Ambulatory Visit: Payer: Self-pay

## 2021-05-08 VITALS — BP 132/74 | HR 78 | Ht 71.0 in | Wt 245.0 lb

## 2021-05-08 DIAGNOSIS — N400 Enlarged prostate without lower urinary tract symptoms: Secondary | ICD-10-CM

## 2021-05-08 DIAGNOSIS — N529 Male erectile dysfunction, unspecified: Secondary | ICD-10-CM

## 2021-05-08 DIAGNOSIS — E349 Endocrine disorder, unspecified: Secondary | ICD-10-CM

## 2021-05-08 MED ORDER — TESTOSTERONE 50 MG/5GM (1%) TD GEL: TRANSDERMAL | 5 refills | Status: DC

## 2021-05-08 MED ORDER — TADALAFIL 20 MG PO TABS: 20.0000 mg | ORAL_TABLET | Freq: Every day | ORAL | 3 refills | Status: DC | PRN

## 2021-05-08 MED ORDER — TADALAFIL 5 MG PO TABS: 5.0000 mg | ORAL_TABLET | Freq: Every day | ORAL | 3 refills | Status: DC

## 2021-05-16 NOTE — Progress Notes (Signed)
? ? ?05/08/2021 ?1:04 PM  ? ?Jeffery [name] ?[birth year] ?[id number] ? ?Referring provider: Delsa Grana, PA-C ?[address] ?[address] ?[place],  [place] [address] ? ?Chief Complaint  ?Patient presents with  ? Other  ?  ?Urological history: ?1. BPH with LU TS ?-TUMT~ 5 years ago  ?-PSA 0.7 ng/mL in 04/2021 ?-I PSS 8/2 ?-managed with tadalafil 5 mg daily ? ?2. Testosterone deficiency ?-contributing factors of age, diabetes, chronic pain medications and obesity ?- applying AndroGel '50mg'$ /5g, 2 packets daily ?- testosterone level 469 ng/dL in 04/2021 ?- HCT and HBG WNL in 04/2021 ? ?3. Prostate nodule ?- PSA 0.7  ?- Prior prostate biopsy with Dr. Yves Dill around 2014 for a prostate nodule-negative  ? ?4. ED ?-contributing factors of age, testosterone deficiency, diabetes, sleep apnea, obesity, HTN, BPH and smoking ?- SHIM 24 ?- taking tadalafil 20 mg on demand dosing  ? ?HPI: ?Jeffery [name] is a 51 y.o. male who presents today for follow up.  ? ?No urinary complaints.  Patient denies any modifying or aggravating factors.  Patient denies any gross hematuria, dysuria or suprapubic/flank pain.  Patient denies any fevers, chills, nausea or vomiting.   ? ? IPSS   ? ? [place] Name 05/08/21 1600  ?  ?  ?  ? International Prostate Symptom Score  ? How often have you had the sensation of not emptying your bladder? Less than half the time    ? How often have you had to urinate less than every two hours? Less than 1 in 5 times    ? How often have you found you stopped and started again several times when you urinated? Less than 1 in 5 times    ? How often have you found it difficult to postpone urination? Less than 1 in 5 times    ? How often have you had a weak urinary stream? Less than 1 in 5 times    ? How often have you had to strain to start urination? Less than 1 in 5 times    ? How many times did you typically get up at night to urinate? 1 Time    ? Total IPSS Score 8    ?  ? Quality of Life due to urinary symptoms  ? If you were  to spend the rest of your life with your urinary condition just the way it is now how would you feel about that? Mostly Satisfied    ? ?  ?  ? ?  ? ? ? ?Score:  ?1-7 Mild ?8-19 Moderate ?20-35 Severe ? ?At goal with tadalafil 20 mg, on-demand-dosing.  Patient still having spontaneous erections.  He denies any pain or curvature with erections.   ? ? SHIM   ? ? [place] Name 05/08/21 1609  ?  ?  ?  ? SHIM: Over the last 6 months:  ? How do you rate your confidence that you could get and keep an erection? High    ? When you had erections with sexual stimulation, how often were your erections hard enough for penetration (entering your partner)? Almost Always or Always    ? During sexual intercourse, how often were you able to maintain your erection after you had penetrated (entered) your partner? Almost Always or Always    ? During sexual intercourse, how difficult was it to maintain your erection to completion of intercourse? Not Difficult    ? When you attempted sexual intercourse, how often was it satisfactory for you?  Almost Always or Always    ?  ? SHIM Total Score  ? SHIM 24    ? ?  ?  ? ?  ? ? ?Score: ?1-7 Severe ED ?8-11 Moderate ED ?12-16 Mild-Moderate ED ?17-21 Mild ED ?22-25 No ED  ? ?PMH: ?Past Medical History:  ?Diagnosis Date  ? Dental crowns present   ? sides and back  ? Dyslipidemia   ? Enlarged prostate   ? Epididymitis   ? Headache   ? sinus  ? Hypotestosteronism   ? Lower urinary tract symptoms (LUTS) 06/14/2016  ? Nodular prostate with lower urinary tract symptoms   ? Obesity   ? Testosterone deficiency   ? Umbilical hernia   ? Umbilical hernia without obstruction or gangrene   ? ? ?Surgical History: ?Past Surgical History:  ?Procedure Laterality Date  ? HERNIA REPAIR  0/63/01  ? umbilical hernia  ? NASAL SEPTOPLASTY W/ TURBINOPLASTY Bilateral 11/23/2014  ? Procedure: NASAL SEPTOPLASTY WITH TURBINATE REDUCTION;  Surgeon: Carloyn Manner, MD;  Location: [facility];  Service: ENT;  Laterality:  Bilateral;  ? PROSTATE BIOPSY  2015  ? ROOT CANAL    ? WRIST FRACTURE SURGERY Right   ? ? ?Home Medications:  ?Allergies as of 05/08/2021   ? ?   Reactions  ? Sulfa Antibiotics Nausea Only  ? Doxycycline Other (See Comments), Nausea And Vomiting  ? ?  ? ?  ?Medication List  ?  ? ?  ? Accurate as of May 08, 2021 11:59 PM. If you have any questions, ask your nurse or doctor.  ?  ?  ? ?  ? ?atorvastatin 40 MG tablet ?Commonly known as: LIPITOR ?TAKE 1 TABLET(40 MG) BY MOUTH DAILY ?  ?benzonatate 100 MG capsule ?Commonly known as: TESSALON ?Take 2 capsules (200 mg total) by mouth every 8 (eight) hours. ?  ?ipratropium 0.06 % nasal spray ?Commonly known as: ATROVENT ?Place 2 sprays into both nostrils 4 (four) times daily. ?  ?lisinopril 10 MG tablet ?Commonly known as: ZESTRIL ?Take one tablet by mouth daily ?  ?omeprazole 20 MG capsule ?Commonly known as: PRILOSEC ?Take 1 capsule (20 mg total) by mouth daily. ?  ?oseltamivir 75 MG capsule ?Commonly known as: TAMIFLU ?Take 1 capsule (75 mg total) by mouth every 12 (twelve) hours. ?  ?promethazine-dextromethorphan 6.25-15 MG/5ML syrup ?Commonly known as: PROMETHAZINE-DM ?Take 5 mLs by mouth 4 (four) times daily as needed. ?  ?tadalafil 10 MG tablet ?Commonly known as: Cialis ?Take 1 tablet (10 mg total) by mouth daily as needed for erectile dysfunction. ?What changed: Another medication with the same name was added. Make sure you understand how and when to take each. ?Changed by: Zara Council, PA-C ?  ?tadalafil 5 MG tablet ?Commonly known as: CIALIS ?Take 1 tablet (5 mg total) by mouth daily. ?What changed: Another medication with the same name was added. Make sure you understand how and when to take each. ?Changed by: Zara Council, PA-C ?  ?tadalafil 20 MG tablet ?Commonly known as: CIALIS ?Take 1 tablet (20 mg total) by mouth daily as needed for erectile dysfunction. ?What changed: You were already taking a medication with the same name, and this prescription  was added. Make sure you understand how and when to take each. ?Changed by: Zara Council, PA-C ?  ?testosterone 50 MG/5GM (1%) Gel ?Commonly known as: ANDROGEL ?APPLY THE CONTENTS OF 2 PACKETS TO CLEAN, DRY, AND INTACT SKIN DAILY. AVOID FACE AND SCROTUM. ?  ? ?  ? ? ?  Allergies:  ?Allergies  ?Allergen Reactions  ? Sulfa Antibiotics Nausea Only  ? Doxycycline Other (See Comments) and Nausea And Vomiting  ? ? ?Family History: ?Family History  ?Problem Relation Age of Onset  ? Diabetes Mother   ? Cancer Mother   ?     Colon Cancer  ? Hypertension Father   ? Cancer Maternal Grandmother   ?     Lung-  Non-Smoker  ? ? ?Social History:  reports that he has been smoking cigarettes and e-cigarettes. He has a 30.00 pack-year smoking history. He has never used smokeless tobacco. He reports that he does not drink alcohol and does not use drugs. ? ?ROS: ?Pertinent ROS in HPI ? ?Physical Exam: ?BP 132/74   Pulse 78   Ht '5\' 11"'$  (1.803 m)   Wt 245 lb (111.1 kg)   BMI 34.17 kg/m?   ?Constitutional:  Well nourished. Alert and oriented, No acute distress. ?HEENT: [place] AT, mask in place.  Trachea midline ?Cardiovascular: No clubbing, cyanosis, or edema. ?Respiratory: Normal respiratory effort, no increased work of breathing. ?Neurologic: Grossly intact, no focal deficits, moving all 4 extremities. ?Psychiatric: Normal mood and affect.  ? ?Laboratory Data: ? ?  Latest Ref Rng & Units 05/03/2021  ?  3:20 PM 09/12/2020  ? 11:40 AM 09/04/2020  ?  2:37 PM  ?CBC  ?WBC 3.8 - 10.8 Thousand/uL   10.6    ?Hemoglobin 13.0 - 17.7 g/dL 14.9   15.1   15.3    ?Hematocrit 37.5 - 51.0 % 42.9   43.1   44.9    ?Platelets 140 - 400 Thousand/uL   273    ?  ? ?  Latest Ref Rng & Units 09/04/2020  ?  2:37 PM 02/11/2020  ?  2:57 PM 08/13/2019  ?  8:04 AM  ?CMP  ?Glucose 65 - 139 mg/dL 97   79   104    ?BUN 7 - 25 mg/dL '19   26   23    '$ ?Creatinine 0.60 - 1.29 mg/dL 1.06   1.27   1.06    ?Sodium 135 - 146 mmol/L 138   139   139    ?Potassium 3.5 - 5.3 mmol/L 4.3    4.2   4.7    ?Chloride 98 - 110 mmol/L 105   104   105    ?CO2 20 - 32 mmol/L '27   26   28    '$ ?Calcium 8.6 - 10.3 mg/dL 9.3   9.2   9.4    ?Total Protein 6.1 - 8.1 g/dL 6.5   6.7   6.6    ?Total Bilirubin 0.2

## 2021-05-25 DIAGNOSIS — G4733 Obstructive sleep apnea (adult) (pediatric): Secondary | ICD-10-CM | POA: Diagnosis not present

## 2021-06-24 DIAGNOSIS — G4733 Obstructive sleep apnea (adult) (pediatric): Secondary | ICD-10-CM | POA: Diagnosis not present

## 2021-07-02 DIAGNOSIS — G4733 Obstructive sleep apnea (adult) (pediatric): Secondary | ICD-10-CM | POA: Diagnosis not present

## 2021-07-18 DIAGNOSIS — G4733 Obstructive sleep apnea (adult) (pediatric): Secondary | ICD-10-CM | POA: Diagnosis not present

## 2021-08-02 DIAGNOSIS — G4733 Obstructive sleep apnea (adult) (pediatric): Secondary | ICD-10-CM | POA: Diagnosis not present

## 2021-08-20 ENCOUNTER — Other Ambulatory Visit: Payer: Self-pay

## 2021-08-20 DIAGNOSIS — I10 Essential (primary) hypertension: Secondary | ICD-10-CM

## 2021-08-21 MED ORDER — LISINOPRIL 10 MG PO TABS: ORAL_TABLET | ORAL | 0 refills | Status: DC

## 2021-08-30 NOTE — Progress Notes (Deleted)
08/31/2021 7:54 PM   Jeffery [name] 1970/07/03 [id number]  Referring provider: Delsa Grana, PA-C [address] [place],  [place] [address]  Urological history: 1. BPH with LU TS -TUMT~ 5 years ago  -PSA 0.7 ng/mL in 04/2021 -I PSS 8/2 -managed with tadalafil 5 mg daily  2. Testosterone deficiency -contributing factors of age, diabetes, chronic pain medications and obesity - applying AndroGel '50mg'$ /5g, 2 packets daily - testosterone level 469 ng/dL in 04/2021 - HCT and HBG WNL in 04/2021  3. Prostate nodule - PSA 0.7  - Prior prostate biopsy with Dr. Yves Dill around 2014 for a prostate nodule-negative   4. ED -contributing factors of age, testosterone deficiency, diabetes, sleep apnea, obesity, HTN, BPH and smoking - SHIM 24 - taking tadalafil 20 mg on demand dosing   No chief complaint on file.   HPI: Jeffery [name] is a 51 y.o. male who presents today for urinary pain.   UA ***  PVR ***  PMH: Past Medical History:  Diagnosis Date   Dental crowns present    sides and back   Dyslipidemia    Enlarged prostate    Epididymitis    Headache    sinus   Hypotestosteronism    Lower urinary tract symptoms (LUTS) 06/14/2016   Nodular prostate with lower urinary tract symptoms    Obesity    Testosterone deficiency    Umbilical hernia    Umbilical hernia without obstruction or gangrene     Surgical History: Past Surgical History:  Procedure Laterality Date   HERNIA REPAIR  2/56/38   umbilical hernia   NASAL SEPTOPLASTY W/ TURBINOPLASTY Bilateral 11/23/2014   Procedure: NASAL SEPTOPLASTY WITH TURBINATE REDUCTION;  Surgeon: Carloyn Manner, MD;  Location: [facility];  Service: ENT;  Laterality: Bilateral;   PROSTATE BIOPSY  2015   ROOT CANAL     WRIST FRACTURE SURGERY Right     Home Medications:  Allergies as of 08/31/2021       Reactions   Sulfa Antibiotics Nausea Only   Doxycycline Other (See Comments), Nausea And Vomiting         Medication List        Accurate as of August 30, 2021  7:54 PM. If you have any questions, ask your nurse or doctor.          atorvastatin 40 MG tablet Commonly known as: LIPITOR TAKE 1 TABLET(40 MG) BY MOUTH DAILY   benzonatate 100 MG capsule Commonly known as: TESSALON Take 2 capsules (200 mg total) by mouth every 8 (eight) hours.   ipratropium 0.06 % nasal spray Commonly known as: ATROVENT Place 2 sprays into both nostrils 4 (four) times daily.   lisinopril 10 MG tablet Commonly known as: ZESTRIL Take one tablet by mouth daily   omeprazole 20 MG capsule Commonly known as: PRILOSEC Take 1 capsule (20 mg total) by mouth daily.   oseltamivir 75 MG capsule Commonly known as: TAMIFLU Take 1 capsule (75 mg total) by mouth every 12 (twelve) hours.   promethazine-dextromethorphan 6.25-15 MG/5ML syrup Commonly known as: PROMETHAZINE-DM Take 5 mLs by mouth 4 (four) times daily as needed.   tadalafil 10 MG tablet Commonly known as: Cialis Take 1 tablet (10 mg total) by mouth daily as needed for erectile dysfunction.   tadalafil 5 MG tablet Commonly known as: CIALIS Take 1 tablet (5 mg total) by mouth daily.   tadalafil 20 MG tablet Commonly known as: CIALIS Take 1 tablet (20 mg total) by  mouth daily as needed for erectile dysfunction.   testosterone 50 MG/5GM (1%) Gel Commonly known as: ANDROGEL APPLY THE CONTENTS OF 2 PACKETS TO CLEAN, DRY, AND INTACT SKIN DAILY. AVOID FACE AND SCROTUM.        Allergies:  Allergies  Allergen Reactions   Sulfa Antibiotics Nausea Only   Doxycycline Other (See Comments) and Nausea And Vomiting    Family History: Family History  Problem Relation Age of Onset   Diabetes Mother    Cancer Mother        Colon Cancer   Hypertension Father    Cancer Maternal Grandmother        Lung-  Non-Smoker    Social History:  reports that he has been smoking cigarettes and e-cigarettes. He has a 30.00 pack-year smoking history. He has  never used smokeless tobacco. He reports that he does not drink alcohol and does not use drugs.  ROS: Pertinent ROS in HPI  Physical Exam: There were no vitals taken for this visit.  Constitutional:  Well nourished. Alert and oriented, No acute distress. HEENT: [place] AT, mask in place.  Trachea midline Cardiovascular: No clubbing, cyanosis, or edema. Respiratory: Normal respiratory effort, no increased work of breathing. Neurologic: Grossly intact, no focal deficits, moving all 4 extremities. Psychiatric: Normal mood and affect.   Laboratory Data:    Latest Ref Rng & Units 05/03/2021    3:20 PM 09/12/2020   11:40 AM 09/04/2020    2:37 PM  CBC  WBC 3.8 - 10.8 Thousand/uL   10.6   Hemoglobin 13.0 - 17.7 g/dL 14.9  15.1  15.3   Hematocrit 37.5 - 51.0 % 42.9  43.1  44.9   Platelets 140 - 400 Thousand/uL   273        Latest Ref Rng & Units 09/04/2020    2:37 PM 02/11/2020    2:57 PM 08/13/2019    8:04 AM  CMP  Glucose 65 - 139 mg/dL 97  79  104   BUN 7 - 25 mg/dL '19  26  23   '$ Creatinine 0.60 - 1.29 mg/dL 1.06  1.27  1.06   Sodium 135 - 146 mmol/L 138  139  139   Potassium 3.5 - 5.3 mmol/L 4.3  4.2  4.7   Chloride 98 - 110 mmol/L 105  104  105   CO2 20 - 32 mmol/L '27  26  28   '$ Calcium 8.6 - 10.3 mg/dL 9.3  9.2  9.4   Total Protein 6.1 - 8.1 g/dL 6.5  6.7  6.6   Total Bilirubin 0.2 - 1.2 mg/dL 0.3  0.3  0.3   AST 10 - 40 U/L 40  22  27   ALT 9 - 46 U/L '17  11  17      '$ Lab Results  Component Value Date   TESTOSTERONE 469 05/03/2021  I have reviewed the labs.   Pertinent Imaging: No recent imaging  Assessment & Plan:    1. Dysuria -UA *** -urine culture  2. Testosterone deficiency -Testosterone at therapeutic levels -Continue AndroGel 1% gel, 2 packets daily - refills sent to [facility] in [place]  3. BPH with LUTS -PSA stable -continue conservative management, avoiding bladder irritants and timed voiding's -Continue tadalafil  5 mg daily-refill sent  4. Prostate  nodule -PSA stable  5. ED -continue tadalafil 20 mg, on-demand-dosing -refill sent  No follow-ups on file.  These notes generated with voice recognition software. I apologize for typographical errors.  Jeffery Sally,  PA-C  [place] [address]  [place], [place] [address] [phone]

## 2021-08-31 ENCOUNTER — Ambulatory Visit: Payer: BC Managed Care – PPO | Admitting: Urology

## 2021-08-31 DIAGNOSIS — R300 Dysuria: Secondary | ICD-10-CM

## 2021-09-01 DIAGNOSIS — G4733 Obstructive sleep apnea (adult) (pediatric): Secondary | ICD-10-CM | POA: Diagnosis not present

## 2021-09-24 ENCOUNTER — Telehealth (INDEPENDENT_AMBULATORY_CARE_PROVIDER_SITE_OTHER): Payer: BC Managed Care – PPO | Admitting: Nurse Practitioner

## 2021-09-25 ENCOUNTER — Ambulatory Visit (INDEPENDENT_AMBULATORY_CARE_PROVIDER_SITE_OTHER): Payer: BC Managed Care – PPO | Admitting: Family Medicine

## 2021-09-25 ENCOUNTER — Encounter: Payer: Self-pay | Admitting: Family Medicine

## 2021-09-25 VITALS — BP 110/72 | HR 94 | Temp 97.9°F | Resp 16 | Ht 71.0 in | Wt 241.2 lb

## 2021-09-25 DIAGNOSIS — R300 Dysuria: Secondary | ICD-10-CM | POA: Diagnosis not present

## 2021-09-25 DIAGNOSIS — R350 Frequency of micturition: Secondary | ICD-10-CM

## 2021-09-25 DIAGNOSIS — E88819 Insulin resistance, unspecified: Secondary | ICD-10-CM

## 2021-09-25 DIAGNOSIS — N401 Enlarged prostate with lower urinary tract symptoms: Secondary | ICD-10-CM | POA: Diagnosis not present

## 2021-09-25 DIAGNOSIS — N138 Other obstructive and reflux uropathy: Secondary | ICD-10-CM

## 2021-09-25 DIAGNOSIS — G4733 Obstructive sleep apnea (adult) (pediatric): Secondary | ICD-10-CM

## 2021-09-25 DIAGNOSIS — B9689 Other specified bacterial agents as the cause of diseases classified elsewhere: Secondary | ICD-10-CM

## 2021-09-25 DIAGNOSIS — R5383 Other fatigue: Secondary | ICD-10-CM

## 2021-09-25 DIAGNOSIS — R319 Hematuria, unspecified: Secondary | ICD-10-CM | POA: Diagnosis not present

## 2021-09-25 DIAGNOSIS — J019 Acute sinusitis, unspecified: Secondary | ICD-10-CM | POA: Diagnosis not present

## 2021-09-25 DIAGNOSIS — R591 Generalized enlarged lymph nodes: Secondary | ICD-10-CM | POA: Diagnosis not present

## 2021-09-25 DIAGNOSIS — I10 Essential (primary) hypertension: Secondary | ICD-10-CM

## 2021-09-25 DIAGNOSIS — E8881 Metabolic syndrome: Secondary | ICD-10-CM

## 2021-09-25 DIAGNOSIS — F17200 Nicotine dependence, unspecified, uncomplicated: Secondary | ICD-10-CM

## 2021-09-25 LAB — POCT URINALYSIS DIPSTICK
Bilirubin, UA: NEGATIVE
Glucose, UA: NEGATIVE
Ketones, UA: NEGATIVE
Nitrite, UA: NEGATIVE
Protein, UA: NEGATIVE
Spec Grav, UA: 1.01 (ref 1.010–1.025)
Urobilinogen, UA: 0.2 E.U./dL
pH, UA: 5 (ref 5.0–8.0)

## 2021-09-25 MED ORDER — AMOXICILLIN-POT CLAVULANATE 875-125 MG PO TABS: 1.0000 | ORAL_TABLET | Freq: Two times a day (BID) | ORAL | 0 refills | Status: AC

## 2021-09-25 NOTE — Progress Notes (Signed)
Appointment was changed to In person appointment

## 2021-09-25 NOTE — Progress Notes (Signed)
Patient ID: Jeffery [name], male    DOB: 07/03/1970, 51 y.o.   MRN: [id number]  PCP: Delsa Grana, PA-C  Chief Complaint  Patient presents with   Dysuria   Urinary Frequency    Urine sx onset for a week   Sinusitis    Has been on going for weeks    Subjective:   Jeffery [name] is a 51 y.o. male, presents to clinic with CC of the following:  HPI  Urinary sx worsened after a month of using a lot of otc sinus and allergy meds  Results for orders placed or performed in visit on 09/25/21  POCT Urinalysis Dipstick  Result Value Ref Range   Color, UA Dark Yellow    Clarity, UA Cloudy    Glucose, UA Negative Negative   Bilirubin, UA Negative    Ketones, UA Negative    Spec Grav, UA 1.010 1.010 - 1.025   Blood, UA Moderate    pH, UA 5.0 5.0 - 8.0   Protein, UA Negative Negative   Urobilinogen, UA 0.2 0.2 or 1.0 E.U./dL   Nitrite, UA Negative    Leukocytes, UA Trace (A) Negative   Appearance Yellow    Odor Foul     -IPSS 8/2  Hx of BPH and LUTS, ED and testosterone deficiency managed by urology last OV 05/08/21  Freq worse than baseline, no dysuria or hematuria He denies any penile discharge, scrotal or testicular pain denies any risk for STD exposure, is married with 1 partner for many years.  Mostly concerned about sinus congestion, drainage, sore throat and neck pain and swollen lymph nodes for over a month with no improvement after trying everything otc -he has tried antihistamines, decongestant, nasal sprays He has remote history of recurrent sinusitis but that seemed to resolve after having a nasal turbinate surgery with ENT over 10 years ago, and he does not have any history of seasonal allergies, no recent known sick contacts He also endorses extreme exhaustion and sleepiness, if he takes a short break at all he will fall asleep but does not like him His last testosterone was in normal range, he has obstructive sleep apnea and is compliant on CPAP and states that he  is even gotten good sleep with the congestion    Patient Active Problem List   Diagnosis Date Noted   Hypertriglyceridemia 09/06/2020   Scalp cyst 02/11/2020   Family history of colon cancer in mother 08/12/2019   Obstructive sleep apnea syndrome 08/12/2019   Current smoker 08/12/2019   Insulin resistance 81/44/8185   Metabolic syndrome 63/14/9702   Gastroesophageal reflux disease without esophagitis 03/17/2018   Other chest pain 03/16/2018   Mixed hyperlipidemia 03/16/2018   Essential hypertension 12/22/2017   Tobacco abuse counseling 10/10/2017   Biceps tendon rupture, right, subsequent encounter 03/20/2017   Hypogonadism male 09/19/2016   Family history of type 2 diabetes mellitus in mother 04/05/2016   Urinary incontinence 03/18/2016   Benign prostatic hyperplasia with urinary obstruction 03/10/2015   Dyslipidemia 03/10/2015   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   Opioid type dependence, abuse ([place]) 03/10/2015   Class 1 obesity with serious comorbidity and body mass index (BMI) of 34.0 to 34.9 in adult 03/10/2015   Encounter for annual physical exam 12/23/2014   History of nasal septoplasty 11/23/2014   Chronic prostatitis 10/21/2014   Overweight (BMI 25.0-29.9) 63/78/5885   Umbilical hernia 02/77/4128      Current Outpatient Medications:    atorvastatin (LIPITOR) 40 MG  tablet, TAKE 1 TABLET(40 MG) BY MOUTH DAILY, Disp: 90 tablet, Rfl: 3   benzonatate (TESSALON) 100 MG capsule, Take 2 capsules (200 mg total) by mouth every 8 (eight) hours., Disp: 21 capsule, Rfl: 0   ipratropium (ATROVENT) 0.06 % nasal spray, Place 2 sprays into both nostrils 4 (four) times daily., Disp: 15 mL, Rfl: 12   lisinopril (ZESTRIL) 10 MG tablet, Take one tablet by mouth daily, Disp: 90 tablet, Rfl: 0   omeprazole (PRILOSEC) 20 MG capsule, Take 1 capsule (20 mg total) by mouth daily., Disp: 90 capsule, Rfl: 0   oseltamivir (TAMIFLU) 75 MG capsule, Take 1 capsule (75 mg total) by mouth every  12 (twelve) hours., Disp: 10 capsule, Rfl: 0   promethazine-dextromethorphan (PROMETHAZINE-DM) 6.25-15 MG/5ML syrup, Take 5 mLs by mouth 4 (four) times daily as needed., Disp: 118 mL, Rfl: 0   tadalafil (CIALIS) 10 MG tablet, Take 1 tablet (10 mg total) by mouth daily as needed for erectile dysfunction., Disp: 30 tablet, Rfl: 6   tadalafil (CIALIS) 20 MG tablet, Take 1 tablet (20 mg total) by mouth daily as needed for erectile dysfunction., Disp: 30 tablet, Rfl: 3   tadalafil (CIALIS) 5 MG tablet, Take 1 tablet (5 mg total) by mouth daily., Disp: 90 tablet, Rfl: 3   testosterone (ANDROGEL) 50 MG/5GM (1%) GEL, APPLY THE CONTENTS OF 2 PACKETS TO CLEAN, DRY, AND INTACT SKIN DAILY. AVOID FACE AND SCROTUM., Disp: 300 g, Rfl: 5   Allergies  Allergen Reactions   Sulfa Antibiotics Nausea Only   Doxycycline Other (See Comments) and Nausea And Vomiting     Social History   Tobacco Use   Smoking status: Every Day    Packs/day: 1.00    Years: 30.00    Total pack years: 30.00    Types: Cigarettes, E-cigarettes   Smokeless tobacco: Never  Vaping Use   Vaping Use: Every day  Substance Use Topics   Alcohol use: No    Alcohol/week: 0.0 standard drinks of alcohol   Drug use: No      Chart Review Today: I personally reviewed active problem list, medication list, allergies, family history, social history, health maintenance, notes from last encounter, lab results, imaging with the patient/caregiver today.  Review of Systems  Constitutional: Negative.   HENT: Negative.    Eyes: Negative.   Respiratory: Negative.    Cardiovascular: Negative.   Gastrointestinal: Negative.   Endocrine: Negative.   Genitourinary: Negative.   Musculoskeletal: Negative.   Skin: Negative.   Allergic/Immunologic: Negative.   Neurological: Negative.   Hematological: Negative.   Psychiatric/Behavioral: Negative.    All other systems reviewed and are negative.      Objective:   Vitals:   09/25/21 1535  BP:  110/72  Pulse: 94  Resp: 16  Temp: 97.9 F (36.6 C)  TempSrc: Oral  SpO2: 98%  Weight: 241 lb 3.2 oz (109.4 kg)  Height: '5\' 11"'$  (1.803 m)    Body mass index is 33.64 kg/m.  Physical Exam Vitals and nursing note reviewed.  Constitutional:      General: He is not in acute distress.    Appearance: Normal appearance. He is well-developed. He is obese. He is not ill-appearing, toxic-appearing or diaphoretic.  HENT:     Head: Normocephalic and atraumatic.     Jaw: There is normal jaw occlusion. No trismus.     Right Ear: Tympanic membrane, ear canal and external ear normal. There is no impacted cerumen.     Left Ear: Tympanic  membrane, ear canal and external ear normal. There is no impacted cerumen.     Nose: Mucosal edema, congestion and rhinorrhea present.     Right Turbinates: Swollen.     Left Turbinates: Swollen.     Right Sinus: Maxillary sinus tenderness and frontal sinus tenderness present.     Left Sinus: Maxillary sinus tenderness and frontal sinus tenderness present.     Mouth/Throat:     Mouth: Mucous membranes are not pale, dry and not cyanotic.     Pharynx: Uvula midline. Posterior oropharyngeal erythema present. No pharyngeal swelling, oropharyngeal exudate or uvula swelling.     Tonsils: No tonsillar exudate or tonsillar abscesses. 0 on the right. 0 on the left.     Comments: Tongue matted white Eyes:     General: Lids are normal.        Right eye: No discharge.        Left eye: No discharge.     Conjunctiva/sclera: Conjunctivae normal.  Neck:     Trachea: Trachea and phonation normal. No tracheal deviation.  Cardiovascular:     Rate and Rhythm: Normal rate and regular rhythm.     Pulses: Normal pulses.          Radial pulses are 2+ on the right side and 2+ on the left side.     Heart sounds: Normal heart sounds. No murmur heard.    No friction rub. No gallop.  Pulmonary:     Effort: Pulmonary effort is normal. No tachypnea, accessory muscle usage or  respiratory distress.     Breath sounds: Normal breath sounds. No decreased breath sounds.  Abdominal:     General: Bowel sounds are normal.     Palpations: Abdomen is soft.  Musculoskeletal:     Cervical back: Normal range of motion and neck supple.  Lymphadenopathy:     Head:     Right side of head: No submandibular or tonsillar adenopathy.     Left side of head: No submandibular or tonsillar adenopathy.     Cervical: Cervical adenopathy present.  Skin:    General: Skin is warm and dry.     Coloration: Skin is not pale.     Findings: No rash.     Nails: There is no clubbing.  Neurological:     Mental Status: He is alert. Mental status is at baseline.     Motor: No abnormal muscle tone.     Coordination: Coordination normal.     Gait: Gait normal.  Psychiatric:        Mood and Affect: Mood normal.        Speech: Speech normal.        Behavior: Behavior normal. Behavior is cooperative.      Results for orders placed or performed in visit on 09/25/21  POCT Urinalysis Dipstick  Result Value Ref Range   Color, UA Dark Yellow    Clarity, UA Cloudy    Glucose, UA Negative Negative   Bilirubin, UA Negative    Ketones, UA Negative    Spec Grav, UA 1.010 1.010 - 1.025   Blood, UA Moderate    pH, UA 5.0 5.0 - 8.0   Protein, UA Negative Negative   Urobilinogen, UA 0.2 0.2 or 1.0 E.U./dL   Nitrite, UA Negative    Leukocytes, UA Trace (A) Negative   Appearance Yellow    Odor Foul        Assessment & Plan:     ICD-10-CM   1. Hematuria,  unspecified type  R31.9 POCT Urinalysis Dipstick    Urine Culture    CBC with Differential/Platelet    COMPLETE METABOLIC PANEL WITH GFR   recheck after abx    2. Urinary frequency  R35.0 POCT Urinalysis Dipstick    Urine Culture    CBC with Differential/Platelet    COMPLETE METABOLIC PANEL WITH GFR   slightly more than his baseline BPH LUTS he attributes towards using a lot of OTC cold/sinus/allergy meds, add urine culture    3.  Benign prostatic hyperplasia with urinary obstruction  N40.1    N13.8    per urology, IPSS 8     4. Acute bacterial rhinosinusitis  J01.90 amoxicillin-clavulanate (AUGMENTIN) 875-125 MG tablet   B96.89    sx for over a month, erythematous and edematous nasal mucosa with tenderness, OP erythema and lymphadenopathy, cover with aug for ABS/pharyngitis    5. Lymphadenopathy  R59.1 CBC with Differential/Platelet   likely reactive    6. Fatigue, unspecified type  R53.83 CBC with Differential/Platelet    COMPLETE METABOLIC PANEL WITH GFR    TSH   tx acute illness, basic labs, r/o anemia, hypothyroid and check chemistry and encouraged pt to f/up if not improved    7. Obstructive sleep apnea syndrome  G47.33 CBC with Differential/Platelet   compliant on CPAP     8. Current smoker  F17.200 CBC with Differential/Platelet    COMPLETE METABOLIC PANEL WITH GFR   sig smoking hx     9. Essential hypertension  D42 COMPLETE METABOLIC PANEL WITH GFR   BP at goal today, well controlled    10. Insulin resistance  A76.81 COMPLETE METABOLIC PANEL WITH GFR   high insulin and hx of metabolic syndrome w/o elevated A1C checking basic labs and will add on A1C if glucose is elevated     Treating patient for sinusitis over a month with severe erythema and edema and lymphadenopathy on exam and sinus tenderness do feel antibiotics are indicated He endorses severe fatigue and exhaustion.  Basic labs will be done today and explained to him that he may have some of the symptoms resolve after sinusitis is resolved and encouraged him to follow-up in office if his fatigue is not improved   Delsa Grana, PA-C 09/25/21 3:55 PM

## 2021-09-26 LAB — CBC WITH DIFFERENTIAL/PLATELET
Absolute Monocytes: 490 cells/uL (ref 200–950)
Basophils Absolute: 61 cells/uL (ref 0–200)
Basophils Relative: 0.6 %
Eosinophils Absolute: 122 cells/uL (ref 15–500)
Eosinophils Relative: 1.2 %
HCT: 46.8 % (ref 38.5–50.0)
Hemoglobin: 16.1 g/dL (ref 13.2–17.1)
Lymphs Abs: 3611 cells/uL (ref 850–3900)
MCH: 30.8 pg (ref 27.0–33.0)
MCHC: 34.4 g/dL (ref 32.0–36.0)
MCV: 89.5 fL (ref 80.0–100.0)
MPV: 10.5 fL (ref 7.5–12.5)
Monocytes Relative: 4.8 %
Neutro Abs: 5916 cells/uL (ref 1500–7800)
Neutrophils Relative %: 58 %
Platelets: 249 10*3/uL (ref 140–400)
RBC: 5.23 10*6/uL (ref 4.20–5.80)
RDW: 13.2 % (ref 11.0–15.0)
Total Lymphocyte: 35.4 %
WBC: 10.2 10*3/uL (ref 3.8–10.8)

## 2021-09-26 LAB — COMPLETE METABOLIC PANEL WITH GFR
AG Ratio: 1.9 (calc) (ref 1.0–2.5)
ALT: 11 U/L (ref 9–46)
AST: 21 U/L (ref 10–40)
Albumin: 4.5 g/dL (ref 3.6–5.1)
Alkaline phosphatase (APISO): 68 U/L (ref 36–130)
BUN/Creatinine Ratio: 13 (calc) (ref 6–22)
BUN: 20 mg/dL (ref 7–25)
CO2: 27 mmol/L (ref 20–32)
Calcium: 9.4 mg/dL (ref 8.6–10.3)
Chloride: 105 mmol/L (ref 98–110)
Creat: 1.58 mg/dL — ABNORMAL HIGH (ref 0.60–1.29)
Globulin: 2.4 g/dL (calc) (ref 1.9–3.7)
Glucose, Bld: 86 mg/dL (ref 65–99)
Potassium: 4 mmol/L (ref 3.5–5.3)
Sodium: 141 mmol/L (ref 135–146)
Total Bilirubin: 0.2 mg/dL (ref 0.2–1.2)
Total Protein: 6.9 g/dL (ref 6.1–8.1)
eGFR: 54 mL/min/{1.73_m2} — ABNORMAL LOW (ref >=60)

## 2021-09-26 LAB — URINE CULTURE
MICRO NUMBER:: 13721644
SPECIMEN QUALITY:: ADEQUATE

## 2021-09-26 LAB — TSH: TSH: 0.89 mIU/L (ref 0.40–4.50)

## 2021-10-02 ENCOUNTER — Ambulatory Visit: Payer: BC Managed Care – PPO | Admitting: Nurse Practitioner

## 2021-10-02 DIAGNOSIS — G4733 Obstructive sleep apnea (adult) (pediatric): Secondary | ICD-10-CM | POA: Diagnosis not present

## 2021-10-03 ENCOUNTER — Other Ambulatory Visit: Payer: Self-pay | Admitting: Family Medicine

## 2021-10-03 DIAGNOSIS — K219 Gastro-esophageal reflux disease without esophagitis: Secondary | ICD-10-CM

## 2021-10-03 MED ORDER — OMEPRAZOLE 20 MG PO CPDR: 20.0000 mg | DELAYED_RELEASE_CAPSULE | Freq: Every day | ORAL | 0 refills | Status: DC

## 2021-11-02 DIAGNOSIS — G4733 Obstructive sleep apnea (adult) (pediatric): Secondary | ICD-10-CM | POA: Diagnosis not present

## 2021-11-08 ENCOUNTER — Ambulatory Visit: Payer: BC Managed Care – PPO | Admitting: Urology

## 2021-11-15 NOTE — Progress Notes (Signed)
11/16/2021 10:51 AM   Jeffery [name] [birth year] [id number]  Referring provider: Delsa Grana, PA-C [address] [place],  [place] [address]  Urological history: 1. BPH with LU TS -TUMT~ 5 years ago  -PSA pending -I PSS 7/2 -PVR 64 mL -tadalafil 5 mg daily  2. Testosterone deficiency -contributing factors of age, diabetes, chronic pain medications and obesity -testosterone pending -hemoglobin/hematocrit (09/2021) 14.9/42.9  -applying AndroGel '50mg'$ /5g, 2 packets daily  3. Prostate nodule - PSA pending -Paternal uncle with prostate cancer at age 21 - Prior prostate biopsy with Dr. Yves Dill around 2014 for a prostate nodule-negative   4. ED -contributing factors of age, testosterone deficiency, diabetes, sleep apnea, obesity, HTN, BPH and smoking - SHIM 22 - taking tadalafil 20 mg on demand dosing   Chief Complaint  Patient presents with   Follow-up     Follow up psa , testosterone      HPI: Jeffery [name] is a 51 y.o. male who presents today for follow up.   He has no issues w/ urination.  Patient denies any modifying or aggravating factors.  Patient denies any gross hematuria, dysuria or suprapubic/flank pain.  Patient denies any fevers, chills, nausea or vomiting.    I PSS 7/2  PVR 64 mL  UA negative    IPSS     Row Name 11/16/21 1000         International Prostate Symptom Score   How often have you had the sensation of not emptying your bladder? Less than 1 in 5     How often have you had to urinate less than every two hours? Less than 1 in 5 times     How often have you found you stopped and started again several times when you urinated? Less than half the time     How often have you found it difficult to postpone urination? Less than 1 in 5 times     How often have you had a weak urinary stream? Less than 1 in 5 times     How often have you had to strain to start urination? Less than 1 in 5 times     How many times did you typically get  up at night to urinate? None     Total IPSS Score 7       Quality of Life due to urinary symptoms   If you were to spend the rest of your life with your urinary condition just the way it is now how would you feel about that? Pleased                Score:  1-7 Mild 8-19 Moderate 20-35 Severe  Patient still having spontaneous erections.  He denies any pain or curvature with erections.  He has an headache sometimes with the tadalafil 20 mg, on-demand-dosing.    SHIM     Row Name 11/16/21 1030         SHIM: Over the last 6 months:   How do you rate your confidence that you could get and keep an erection? High     When you had erections with sexual stimulation, how often were your erections hard enough for penetration (entering your partner)? Most Times (much more than half the time)     During sexual intercourse, how often were you able to maintain your erection after you had penetrated (entered) your partner? Most Times (much more than half the time)     During sexual  intercourse, how difficult was it to maintain your erection to completion of intercourse? Not Difficult     When you attempted sexual intercourse, how often was it satisfactory for you? Almost Always or Always       SHIM Total Score   SHIM 22               Score: 1-7 Severe ED 8-11 Moderate ED 12-16 Mild-Moderate ED 17-21 Mild ED 22-25 No ED   PMH: Past Medical History:  Diagnosis Date   Dental crowns present    sides and back   Dyslipidemia    Enlarged prostate    Epididymitis    Headache    sinus   Hypotestosteronism    Lower urinary tract symptoms (LUTS) 06/14/2016   Nodular prostate with lower urinary tract symptoms    Obesity    Testosterone deficiency    Umbilical hernia    Umbilical hernia without obstruction or gangrene     Surgical History: Past Surgical History:  Procedure Laterality Date   HERNIA REPAIR  6/83/41   umbilical hernia   NASAL SEPTOPLASTY W/ TURBINOPLASTY  Bilateral 11/23/2014   Procedure: NASAL SEPTOPLASTY WITH TURBINATE REDUCTION;  Surgeon: Carloyn Manner, MD;  Location: [facility];  Service: ENT;  Laterality: Bilateral;   PROSTATE BIOPSY  2015   ROOT CANAL     WRIST FRACTURE SURGERY Right     Home Medications:  Allergies as of 11/16/2021       Reactions   Sulfa Antibiotics Nausea Only   Doxycycline Other (See Comments), Nausea And Vomiting        Medication List        Accurate as of November 16, 2021 10:51 AM. If you have any questions, ask your nurse or doctor.          atorvastatin 40 MG tablet Commonly known as: LIPITOR TAKE 1 TABLET(40 MG) BY MOUTH DAILY   ipratropium 0.06 % nasal spray Commonly known as: ATROVENT Place 2 sprays into both nostrils 4 (four) times daily.   lisinopril 10 MG tablet Commonly known as: ZESTRIL Take one tablet by mouth daily   omeprazole 20 MG capsule Commonly known as: PRILOSEC Take 1 capsule (20 mg total) by mouth daily.   sildenafil 100 MG tablet Commonly known as: VIAGRA Take 1 tablet (100 mg total) by mouth daily as needed for erectile dysfunction. Take two hours prior to intercourse on an empty stomach Started by: Zara Council, PA-C   tadalafil 10 MG tablet Commonly known as: Cialis Take 1 tablet (10 mg total) by mouth daily as needed for erectile dysfunction.   tadalafil 5 MG tablet Commonly known as: CIALIS Take 1 tablet (5 mg total) by mouth daily.   tadalafil 20 MG tablet Commonly known as: CIALIS Take 1 tablet (20 mg total) by mouth daily as needed for erectile dysfunction.   testosterone 50 MG/5GM (1%) Gel Commonly known as: ANDROGEL APPLY THE CONTENTS OF 2 PACKETS TO CLEAN, DRY, AND INTACT SKIN DAILY. AVOID FACE AND SCROTUM.        Allergies:  Allergies  Allergen Reactions   Sulfa Antibiotics Nausea Only   Doxycycline Other (See Comments) and Nausea And Vomiting    Family History: Family History  Problem Relation Age of Onset    Diabetes Mother    Cancer Mother        Colon Cancer   Hypertension Father    Cancer Maternal Grandmother        Lung-  Non-Smoker  Social History:  reports that he has been smoking cigarettes and e-cigarettes. He has a 30.00 pack-year smoking history. He has never used smokeless tobacco. He reports that he does not drink alcohol and does not use drugs.  ROS: Pertinent ROS in HPI  Physical Exam: BP 124/84   Pulse 77   Ht '5\' 11"'$  (1.803 m)   Wt 243 lb 8 oz (110.5 kg)   BMI 33.96 kg/m   Constitutional:  Well nourished. Alert and oriented, No acute distress. HEENT: [place] AT, moist mucus membranes.  Trachea midline Cardiovascular: No clubbing, cyanosis, or edema. Respiratory: Normal respiratory effort, no increased work of breathing. GU: No CVA tenderness.  No bladder fullness or masses.  Patient with uncircumcised phallus. Foreskin easily retracted  Urethral meatus is patent.  No penile discharge. No penile lesions or rashes. Scrotum without lesions, cysts, rashes and/or edema.  Testicles are located scrotally bilaterally. No masses are appreciated in the testicles. Left and right epididymis are normal. Rectal: Patient with  normal sphincter tone. Anus and perineum without scarring or rashes. No rectal masses are appreciated. Prostate is approximately 45 grams, 10 mm rubbery nodule is appreciated and tender.  Seminal vesicles could not be palpated.   Neurologic: Grossly intact, no focal deficits, moving all 4 extremities. Psychiatric: Normal mood and affect.   Laboratory Data:    Latest Ref Rng & Units 09/25/2021    4:21 PM 05/03/2021    3:20 PM 09/12/2020   11:40 AM  CBC  WBC 3.8 - 10.8 Thousand/uL 10.2     Hemoglobin 13.2 - 17.1 g/dL 16.1  14.9  15.1   Hematocrit 38.5 - 50.0 % 46.8  42.9  43.1   Platelets 140 - 400 Thousand/uL 249          Latest Ref Rng & Units 09/25/2021    4:21 PM 09/04/2020    2:37 PM 02/11/2020    2:57 PM  CMP  Glucose 65 - 99 mg/dL 86  97  79   BUN 7 - 25  mg/dL '20  19  26   '$ Creatinine 0.60 - 1.29 mg/dL 1.58  1.06  1.27   Sodium 135 - 146 mmol/L 141  138  139   Potassium 3.5 - 5.3 mmol/L 4.0  4.3  4.2   Chloride 98 - 110 mmol/L 105  105  104   CO2 20 - 32 mmol/L '27  27  26   '$ Calcium 8.6 - 10.3 mg/dL 9.4  9.3  9.2   Total Protein 6.1 - 8.1 g/dL 6.9  6.5  6.7   Total Bilirubin 0.2 - 1.2 mg/dL 0.2  0.3  0.3   AST 10 - 40 U/L 21  40  22   ALT 9 - 46 U/L '11  17  11    '$ Urinalysis  See Epic and HPI I have reviewed the labs.   Pertinent Imaging: No recent imaging  Assessment & Plan:    1. Testosterone deficiency -Testosterone pending -Hemoglobin/hematocrit drawn in August at PCPs office were normal -Continue AndroGel 1% gel, 2 packets daily  2. BPH with LUTS -PSA pending -continue conservative management, avoiding bladder irritants and timed voiding's -Continue tadalafil  5 mg daily-refill sent  3. Prostate nodule -PSA pending -Prior prostate biopsy in 2014-negative  4. ED -continue tadalafil 20 mg, on-demand-dosing -refill sent -Also gave a prescription for sildenafil 100 mg on-demand dosing as he has headaches with tadalafil 20 mg  Return in about 6 months (around 05/17/2022) for PSA, testosterone and H &  H.  These notes generated with voice recognition software. I apologize for typographical errors.  Zara Council, PA-C  [facility] [address]  [place], [place] [address] [phone]

## 2021-11-16 ENCOUNTER — Encounter: Payer: Self-pay | Admitting: Urology

## 2021-11-16 ENCOUNTER — Ambulatory Visit (INDEPENDENT_AMBULATORY_CARE_PROVIDER_SITE_OTHER): Payer: BC Managed Care – PPO | Admitting: Urology

## 2021-11-16 ENCOUNTER — Other Ambulatory Visit: Payer: Self-pay

## 2021-11-16 ENCOUNTER — Telehealth: Payer: Self-pay | Admitting: Family Medicine

## 2021-11-16 VITALS — BP 124/84 | HR 77 | Ht 71.0 in | Wt 243.5 lb

## 2021-11-16 DIAGNOSIS — E291 Testicular hypofunction: Secondary | ICD-10-CM | POA: Diagnosis not present

## 2021-11-16 DIAGNOSIS — N529 Male erectile dysfunction, unspecified: Secondary | ICD-10-CM

## 2021-11-16 DIAGNOSIS — N400 Enlarged prostate without lower urinary tract symptoms: Secondary | ICD-10-CM | POA: Diagnosis not present

## 2021-11-16 DIAGNOSIS — E349 Endocrine disorder, unspecified: Secondary | ICD-10-CM

## 2021-11-16 DIAGNOSIS — N402 Nodular prostate without lower urinary tract symptoms: Secondary | ICD-10-CM | POA: Diagnosis not present

## 2021-11-16 LAB — URINALYSIS, COMPLETE
Bilirubin, UA: NEGATIVE
Glucose, UA: NEGATIVE
Ketones, UA: NEGATIVE
Leukocytes,UA: NEGATIVE
Nitrite, UA: NEGATIVE
Protein,UA: NEGATIVE
RBC, UA: NEGATIVE
Specific Gravity, UA: 1.02 (ref 1.005–1.030)
Urobilinogen, Ur: 0.2 mg/dL (ref 0.2–1.0)
pH, UA: 7 (ref 5.0–7.5)

## 2021-11-16 LAB — BLADDER SCAN AMB NON-IMAGING: Scan Result: 64

## 2021-11-16 LAB — MICROSCOPIC EXAMINATION: Bacteria, UA: NONE SEEN

## 2021-11-16 MED ORDER — TESTOSTERONE 50 MG/5GM (1%) TD GEL: TRANSDERMAL | 5 refills | Status: DC

## 2021-11-16 MED ORDER — SILDENAFIL CITRATE 100 MG PO TABS: 100.0000 mg | ORAL_TABLET | Freq: Every day | ORAL | 3 refills | Status: DC | PRN

## 2021-11-16 MED ORDER — TADALAFIL 20 MG PO TABS: 20.0000 mg | ORAL_TABLET | Freq: Every day | ORAL | 3 refills | Status: DC | PRN

## 2021-11-16 MED ORDER — TADALAFIL 5 MG PO TABS: 5.0000 mg | ORAL_TABLET | Freq: Every day | ORAL | 3 refills | Status: DC

## 2021-11-17 LAB — TESTOSTERONE: Testosterone: 382 ng/dL (ref 264–916)

## 2021-11-17 LAB — PSA: Prostate Specific Ag, Serum: 0.8 ng/mL (ref 0.0–4.0)

## 2021-12-28 ENCOUNTER — Encounter: Payer: Self-pay | Admitting: Family Medicine

## 2022-01-04 ENCOUNTER — Telehealth: Payer: Self-pay

## 2022-01-04 ENCOUNTER — Ambulatory Visit (INDEPENDENT_AMBULATORY_CARE_PROVIDER_SITE_OTHER): Payer: BC Managed Care – PPO | Admitting: Family Medicine

## 2022-01-04 ENCOUNTER — Other Ambulatory Visit: Payer: Self-pay

## 2022-01-04 ENCOUNTER — Encounter: Payer: Self-pay | Admitting: Family Medicine

## 2022-01-04 VITALS — BP 110/70 | HR 98 | Temp 98.7°F | Resp 16 | Ht 71.0 in | Wt 248.3 lb

## 2022-01-04 DIAGNOSIS — Z1211 Encounter for screening for malignant neoplasm of colon: Secondary | ICD-10-CM

## 2022-01-04 DIAGNOSIS — Z6834 Body mass index (BMI) 34.0-34.9, adult: Secondary | ICD-10-CM

## 2022-01-04 DIAGNOSIS — E669 Obesity, unspecified: Secondary | ICD-10-CM

## 2022-01-04 DIAGNOSIS — E782 Mixed hyperlipidemia: Secondary | ICD-10-CM | POA: Diagnosis not present

## 2022-01-04 DIAGNOSIS — I10 Essential (primary) hypertension: Secondary | ICD-10-CM

## 2022-01-04 DIAGNOSIS — E781 Pure hyperglyceridemia: Secondary | ICD-10-CM | POA: Diagnosis not present

## 2022-01-04 DIAGNOSIS — K219 Gastro-esophageal reflux disease without esophagitis: Secondary | ICD-10-CM | POA: Diagnosis not present

## 2022-01-04 DIAGNOSIS — E88819 Insulin resistance, unspecified: Secondary | ICD-10-CM | POA: Diagnosis not present

## 2022-01-04 DIAGNOSIS — Z23 Encounter for immunization: Secondary | ICD-10-CM

## 2022-01-04 DIAGNOSIS — F17200 Nicotine dependence, unspecified, uncomplicated: Secondary | ICD-10-CM

## 2022-01-04 MED ORDER — OMEPRAZOLE 20 MG PO CPDR: 20.0000 mg | DELAYED_RELEASE_CAPSULE | Freq: Every day | ORAL | 3 refills | Status: DC

## 2022-01-04 MED ORDER — LISINOPRIL 10 MG PO TABS: ORAL_TABLET | ORAL | 3 refills | Status: DC

## 2022-01-04 MED ORDER — NA SULFATE-K SULFATE-MG SULF 17.5-3.13-1.6 GM/177ML PO SOLN: 1.0000 | Freq: Once | ORAL | 0 refills | Status: AC

## 2022-01-04 NOTE — Assessment & Plan Note (Signed)
Requiring long term PPI, previously consulted with GI and did upper endoscopy Long term PPI reviewed, pt can try to wean off, has tried previously w/o success Encouraged pepcid bid prn, and continued diet/lifestyle efforts to manage along with refill on prilosec

## 2022-01-04 NOTE — Assessment & Plan Note (Signed)
30-pack-year history

## 2022-01-04 NOTE — Assessment & Plan Note (Signed)
He recently restarted statin for about 1 month Labs today Currently has atorvastatin 40 mg which she is tolerating and not having any concerning side effects If lipids are not well controlled we could recheck in a few more months with diet and lifestyle efforts or we could also increase his medication or try Crestor

## 2022-01-04 NOTE — Assessment & Plan Note (Signed)
Last office visit triglycerides were extremely high, will need to do direct LDL today Patient has no history of pancreatitis he denies any EtOH at all Discussed low glycemic index diet, rechecking A1c, insulin and lipid panel today His triglycerides may have been better in the past while he was on Ozempic -it was likely managing some of the metabolic dysfunction and insulin resistance in the past and associated high triglycerides

## 2022-01-04 NOTE — Assessment & Plan Note (Signed)
Previously was on Ozempic Weight a few years ago was around 210-215 for the past couple years she has been 240-250 Healthy diet discussed today, we will try to get medications reapproved he was encouraged to check with his insurance coverage for Saxenda or Wegovy

## 2022-01-04 NOTE — Telephone Encounter (Signed)
Gastroenterology Pre-Procedure Review  Request Date: 01/25/22 Requesting Physician: Dr. Vicente Males  PATIENT REVIEW QUESTIONS: The patient responded to the following health history questions as indicated:    1. Are you having any GI issues? no 2. Do you have a personal history of Polyps? no 3. Do you have a family history of Colon Cancer or Polyps? Mother colon cancer 4. Diabetes Mellitus? no 5. Joint replacements in the past 12 months?no 6. Major health problems in the past 3 months?no 7. Any artificial heart valves, MVP, or defibrillator?no    MEDICATIONS & ALLERGIES:    Patient reports the following regarding taking any anticoagulation/antiplatelet therapy:   Plavix, Coumadin, Eliquis, Xarelto, Lovenox, Pradaxa, Brilinta, or Effient? no Aspirin? no  Patient confirms/reports the following medications:  Current Outpatient Medications  Medication Sig Dispense Refill   atorvastatin (LIPITOR) 40 MG tablet TAKE 1 TABLET(40 MG) BY MOUTH DAILY 90 tablet 3   ipratropium (ATROVENT) 0.06 % nasal spray Place 2 sprays into both nostrils 4 (four) times daily. (Patient not taking: Reported on 01/04/2022) 15 mL 12   lisinopril (ZESTRIL) 10 MG tablet Take one tablet by mouth daily 90 tablet 3   omeprazole (PRILOSEC) 20 MG capsule Take 1 capsule (20 mg total) by mouth daily. 90 capsule 3   sildenafil (VIAGRA) 100 MG tablet Take 1 tablet (100 mg total) by mouth daily as needed for erectile dysfunction. Take two hours prior to intercourse on an empty stomach 30 tablet 3   tadalafil (CIALIS) 10 MG tablet Take 1 tablet (10 mg total) by mouth daily as needed for erectile dysfunction. (Patient not taking: Reported on 01/04/2022) 30 tablet 6   tadalafil (CIALIS) 20 MG tablet Take 1 tablet (20 mg total) by mouth daily as needed for erectile dysfunction. 30 tablet 3   tadalafil (CIALIS) 5 MG tablet Take 1 tablet (5 mg total) by mouth daily. (Patient not taking: Reported on 01/04/2022) 90 tablet 3   testosterone  (ANDROGEL) 50 MG/5GM (1%) GEL APPLY THE CONTENTS OF 2 PACKETS TO CLEAN, DRY, AND INTACT SKIN DAILY. AVOID FACE AND SCROTUM. 300 g 5   No current facility-administered medications for this visit.    Patient confirms/reports the following allergies:  Allergies  Allergen Reactions   Sulfa Antibiotics Nausea Only   Doxycycline Other (See Comments) and Nausea And Vomiting    No orders of the defined types were placed in this encounter.   AUTHORIZATION INFORMATION Primary Insurance: 1D#: Group #:  Secondary Insurance: 1D#: Group #:  SCHEDULE INFORMATION: Date:01/25/22  Time: Location: [place]

## 2022-01-04 NOTE — Assessment & Plan Note (Signed)
Blood pressure well controlled and at goal today on lisinopril 10 mg daily, good compliance no side effects or concerns BP Readings from Last 3 Encounters:  01/04/22 110/70  11/16/21 124/84  09/25/21 110/72  Continue lisinopril 10, DASH and smoking cessation encouraged

## 2022-01-04 NOTE — Progress Notes (Signed)
Name: Jeffery [name]   MRN: [id number]    DOB: August 25, 1970   Date:01/04/2022       Progress Note  Chief Complaint  Patient presents with   Follow-up   Hypertension   Gastroesophageal Reflux   Hyperlipidemia     Subjective:   Jeffery [name] is a 51 y.o. male, presents to clinic for routine f/up  Metabolic syndrome previously on ozempic high insulin, high trigs, low HDL HLD w/o being able to calculate LDL, hx of low T and BPH A1C normal Weight a few years ago 210-215, last 3 years more 240 Wt Readings from Last 10 Encounters:  01/04/22 248 lb 4.8 oz (112.6 kg)  11/16/21 243 lb 8 oz (110.5 kg)  09/25/21 241 lb 3.2 oz (109.4 kg)  05/08/21 245 lb (111.1 kg)  12/31/20 245 lb (111.1 kg)  11/03/20 244 lb (110.7 kg)  09/04/20 244 lb 1.6 oz (110.7 kg)  03/14/20 231 lb (104.8 kg)  02/11/20 237 lb 4.8 oz (107.6 kg)  11/26/19 235 lb (106.6 kg)   BMI Readings from Last 5 Encounters:  01/04/22 34.63 kg/m  11/16/21 33.96 kg/m  09/25/21 33.64 kg/m  05/08/21 34.17 kg/m  12/31/20 34.17 kg/m    GERD:   Omeprazole has seen GI previously Due for colonoscopy    Hypertension:  Currently managed on lisinopril 10 mg Pt reports good med compliance and denies any SE.   Blood pressure today is well controlled. BP Readings from Last 3 Encounters:  01/04/22 110/70  11/16/21 124/84  09/25/21 110/72   Pt denies CP, SOB, exertional sx, LE edema, palpitation, Ha's, visual disturbances, lightheadedness, hypotension, syncope. Dietary efforts for BP?  None  Taking statin for the last month atorvastin -atorvastatin 40 mg prior to month ago he has stopped taking it Last labs were over a year ago and triglycerides were extremely high so LDL could not be calculated it was ordered but he did not complete labs Lab Results  Component Value Date   CHOL 238 (H) 09/04/2020   HDL 28 (L) 09/04/2020   [place]  09/04/2020     Comment:     . LDL cholesterol not calculated. Triglyceride  levels greater than 400 mg/dL invalidate calculated LDL results. . Reference range: <100 . Desirable range <100 mg/dL for primary prevention;   <70 mg/dL for patients with CHD or diabetic patients  with > or = 2 CHD risk factors. Marland Kitchen LDL-C is now calculated using the Martin-Hopkins  calculation, which is a validated novel method providing  better accuracy than the Friedewald equation in the  estimation of LDL-C.  Cresenciano Genre et al. Annamaria Helling. 0370;488(89): 2061-2068  (http://education.[facility].com/faq/FAQ164)    TRIG 1,161 (H) 09/04/2020   CHOLHDL 8.5 (H) 09/04/2020    He has had mild triglyceride elevation in the past but not as high as July 2022 he denies any episodes of pancreatitis  Low T, BPH all per urology - labs, meds, ov reviewed today  Current Outpatient Medications:    atorvastatin (LIPITOR) 40 MG tablet, TAKE 1 TABLET(40 MG) BY MOUTH DAILY, Disp: 90 tablet, Rfl: 3   sildenafil (VIAGRA) 100 MG tablet, Take 1 tablet (100 mg total) by mouth daily as needed for erectile dysfunction. Take two hours prior to intercourse on an empty stomach, Disp: 30 tablet, Rfl: 3   tadalafil (CIALIS) 20 MG tablet, Take 1 tablet (20 mg total) by mouth daily as needed for erectile dysfunction., Disp: 30 tablet, Rfl: 3   testosterone (ANDROGEL) 50 MG/5GM (1%) GEL,  APPLY THE CONTENTS OF 2 PACKETS TO CLEAN, DRY, AND INTACT SKIN DAILY. AVOID FACE AND SCROTUM., Disp: 300 g, Rfl: 5   ipratropium (ATROVENT) 0.06 % nasal spray, Place 2 sprays into both nostrils 4 (four) times daily. (Patient not taking: Reported on 01/04/2022), Disp: 15 mL, Rfl: 12   lisinopril (ZESTRIL) 10 MG tablet, Take one tablet by mouth daily, Disp: 90 tablet, Rfl: 3   Na Sulfate-K Sulfate-Mg Sulf 17.5-3.13-1.6 GM/177ML SOLN, Take 1 kit by mouth once for 1 dose., Disp: 354 mL, Rfl: 0   omeprazole (PRILOSEC) 20 MG capsule, Take 1 capsule (20 mg total) by mouth daily., Disp: 90 capsule, Rfl: 3   tadalafil (CIALIS) 10 MG tablet, Take  1 tablet (10 mg total) by mouth daily as needed for erectile dysfunction. (Patient not taking: Reported on 01/04/2022), Disp: 30 tablet, Rfl: 6   tadalafil (CIALIS) 5 MG tablet, Take 1 tablet (5 mg total) by mouth daily. (Patient not taking: Reported on 01/04/2022), Disp: 90 tablet, Rfl: 3  Patient Active Problem List   Diagnosis Date Noted   Hypertriglyceridemia 09/06/2020   Scalp cyst 02/11/2020   Family history of colon cancer in mother 08/12/2019   Obstructive sleep apnea syndrome 08/12/2019   Current smoker 08/12/2019   Insulin resistance 96/29/5284   Metabolic syndrome 13/24/4010   Gastroesophageal reflux disease without esophagitis 03/17/2018   Other chest pain 03/16/2018   Mixed hyperlipidemia 03/16/2018   Essential hypertension 12/22/2017   Tobacco abuse counseling 10/10/2017   Biceps tendon rupture, right, subsequent encounter 03/20/2017   Hypogonadism male 09/19/2016   Family history of type 2 diabetes mellitus in mother 04/05/2016   Urinary incontinence 03/18/2016   Benign prostatic hyperplasia with urinary obstruction 03/10/2015   Dyslipidemia 03/10/2015   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   Opioid type dependence, abuse ([place]) 03/10/2015   Class 1 obesity with serious comorbidity and body mass index (BMI) of 34.0 to 34.9 in adult 03/10/2015   Encounter for annual physical exam 12/23/2014   History of nasal septoplasty 11/23/2014   Chronic prostatitis 10/21/2014   Overweight (BMI 25.0-29.9) 27/25/3664   Umbilical hernia 40/34/7425    Past Surgical History:  Procedure Laterality Date   HERNIA REPAIR  9/56/38   umbilical hernia   NASAL SEPTOPLASTY W/ TURBINOPLASTY Bilateral 11/23/2014   Procedure: NASAL SEPTOPLASTY WITH TURBINATE REDUCTION;  Surgeon: Carloyn Manner, MD;  Location: [facility];  Service: ENT;  Laterality: Bilateral;   PROSTATE BIOPSY  2015   ROOT CANAL     WRIST FRACTURE SURGERY Right     Family History  Problem Relation Age of  Onset   Diabetes Mother    Cancer Mother        Colon Cancer   Hypertension Father    Cancer Maternal Grandmother        Lung-  Non-Smoker    Social History   Tobacco Use   Smoking status: Every Day    Packs/day: 1.00    Years: 30.00    Total pack years: 30.00    Types: Cigarettes, E-cigarettes   Smokeless tobacco: Never  Vaping Use   Vaping Use: Every day  Substance Use Topics   Alcohol use: No    Alcohol/week: 0.0 standard drinks of alcohol   Drug use: No     Allergies  Allergen Reactions   Sulfa Antibiotics Nausea Only   Doxycycline Other (See Comments) and Nausea And Vomiting    Health Maintenance  Topic Date Due   COLONOSCOPY (Pts 45-68yr Insurance coverage  will need to be confirmed)  10/12/2021   COVID-19 Vaccine (4 - Moderna series) 01/20/2022 (Originally 03/20/2020)   TETANUS/TDAP  12/19/2026   INFLUENZA VACCINE  Completed   Hepatitis C Screening  Completed   HIV Screening  Completed   HPV VACCINES  Aged Out    Chart Review Today: I personally reviewed active problem list, medication list, allergies, family history, social history, health maintenance, notes from last encounter, lab results, imaging with the patient/caregiver today.   Review of Systems  Constitutional: Negative.   HENT: Negative.    Eyes: Negative.   Respiratory: Negative.    Cardiovascular: Negative.   Gastrointestinal: Negative.   Endocrine: Negative.   Genitourinary: Negative.   Musculoskeletal: Negative.   Skin: Negative.   Allergic/Immunologic: Negative.   Neurological: Negative.   Hematological: Negative.   Psychiatric/Behavioral: Negative.    All other systems reviewed and are negative.    Objective:   Vitals:   01/04/22 0848  BP: 110/70  Pulse: 98  Resp: 16  Temp: 98.7 F (37.1 C)  TempSrc: Oral  SpO2: 94%  Weight: 248 lb 4.8 oz (112.6 kg)  Height: _0  (1.803 m)    Body mass index is 34.63 kg/m.  Physical Exam Vitals and nursing note reviewed.   Constitutional:      General: He is not in acute distress.    Appearance: Normal appearance. He is well-developed. He is obese. He is not ill-appearing, toxic-appearing or diaphoretic.  HENT:     Head: Normocephalic and atraumatic.     Jaw: No trismus.     Right Ear: External ear normal.     Left Ear: External ear normal.  Eyes:     General: Lids are normal. No scleral icterus.       Right eye: No discharge.        Left eye: No discharge.     Conjunctiva/sclera: Conjunctivae normal.  Neck:     Trachea: Trachea and phonation normal. No tracheal deviation.  Cardiovascular:     Rate and Rhythm: Normal rate and regular rhythm.     Pulses: Normal pulses.          Radial pulses are 2+ on the right side and 2+ on the left side.       Posterior tibial pulses are 2+ on the right side and 2+ on the left side.     Heart sounds: Normal heart sounds. No murmur heard.    No friction rub. No gallop.  Pulmonary:     Effort: Pulmonary effort is normal. No respiratory distress.     Breath sounds: Normal breath sounds. No stridor. No wheezing, rhonchi or rales.  Abdominal:     General: Bowel sounds are normal. There is no distension.     Palpations: Abdomen is soft.  Musculoskeletal:     Right lower leg: No edema.     Left lower leg: No edema.  Skin:    General: Skin is warm and dry.     Coloration: Skin is not jaundiced.     Findings: No rash.     Nails: There is no clubbing.  Neurological:     Mental Status: He is alert. Mental status is at baseline.     Cranial Nerves: No dysarthria or facial asymmetry.     Motor: No tremor or abnormal muscle tone.     Gait: Gait normal.  Psychiatric:        Mood and Affect: Mood normal.  Speech: Speech normal.        Behavior: Behavior normal. Behavior is cooperative.         Assessment & Plan:   Problem List Items Addressed This Visit       Cardiovascular and Mediastinum   Essential hypertension - Primary    Blood pressure well  controlled and at goal today on lisinopril 10 mg daily, good compliance no side effects or concerns BP Readings from Last 3 Encounters:  01/04/22 110/70  11/16/21 124/84  09/25/21 110/72  Continue lisinopril 10, DASH and smoking cessation encouraged       Relevant Medications   lisinopril (ZESTRIL) 10 MG tablet   Other Relevant Orders   COMPLETE METABOLIC PANEL WITH GFR     Digestive   Gastroesophageal reflux disease without esophagitis    Requiring long term PPI, previously consulted with GI and did upper endoscopy Long term PPI reviewed, pt can try to wean off, has tried previously w/o success Encouraged pepcid bid prn, and continued diet/lifestyle efforts to manage along with refill on prilosec      Relevant Medications   omeprazole (PRILOSEC) 20 MG capsule   Other Relevant Orders   Ambulatory referral to Gastroenterology     Endocrine   Insulin resistance    Recheck insulin level it was done a few years ago, he was previously on a GLP 1      Relevant Orders   COMPLETE METABOLIC PANEL WITH GFR   Insulin, Free (Bioactive)   HgB A1c   Lipid Panel w/reflex Direct LDL     Other   Class 1 obesity with serious comorbidity and body mass index (BMI) of 34.0 to 34.9 in adult    Previously was on Ozempic Weight a few years ago was around 210-215 for the past couple years she has been 240-250 Healthy diet discussed today, we will try to get medications reapproved he was encouraged to check with his insurance coverage for Saxenda or Wegovy      Relevant Orders   COMPLETE METABOLIC PANEL WITH GFR   Insulin, Free (Bioactive)   HgB A1c   Lipid Panel w/reflex Direct LDL   Mixed hyperlipidemia    He recently restarted statin for about 1 month Labs today Currently has atorvastatin 40 mg which she is tolerating and not having any concerning side effects If lipids are not well controlled we could recheck in a few more months with diet and lifestyle efforts or we could also  increase his medication or try Crestor      Relevant Medications   lisinopril (ZESTRIL) 10 MG tablet   Other Relevant Orders   COMPLETE METABOLIC PANEL WITH GFR   Lipid Panel w/reflex Direct LDL   Current smoker    30-pack-year history      Hypertriglyceridemia    Last office visit triglycerides were extremely high, will need to do direct LDL today Patient has no history of pancreatitis he denies any EtOH at all Discussed low glycemic index diet, rechecking A1c, insulin and lipid panel today His triglycerides may have been better in the past while he was on Ozempic -it was likely managing some of the metabolic dysfunction and insulin resistance in the past and associated high triglycerides      Relevant Medications   lisinopril (ZESTRIL) 10 MG tablet   Other Relevant Orders   COMPLETE METABOLIC PANEL WITH GFR   Insulin, Free (Bioactive)   HgB A1c   Lipid Panel w/reflex Direct LDL   Other Visit Diagnoses  Need for influenza vaccination       Declined today   Screening for colon cancer       Relevant Orders   Ambulatory referral to Gastroenterology        Return in about 6 months (around 07/05/2022) for Annual Physical.   Delsa Grana, PA-C 01/04/22 5:51 PM

## 2022-01-04 NOTE — Assessment & Plan Note (Signed)
Recheck insulin level it was done a few years ago, he was previously on a GLP 1

## 2022-01-06 ENCOUNTER — Other Ambulatory Visit: Payer: Self-pay | Admitting: Urology

## 2022-01-06 DIAGNOSIS — E349 Endocrine disorder, unspecified: Secondary | ICD-10-CM

## 2022-01-11 ENCOUNTER — Other Ambulatory Visit: Payer: Self-pay

## 2022-01-11 DIAGNOSIS — E782 Mixed hyperlipidemia: Secondary | ICD-10-CM

## 2022-01-11 DIAGNOSIS — E781 Pure hyperglyceridemia: Secondary | ICD-10-CM

## 2022-01-16 ENCOUNTER — Other Ambulatory Visit: Payer: Self-pay | Admitting: Family Medicine

## 2022-01-16 DIAGNOSIS — E88819 Insulin resistance, unspecified: Secondary | ICD-10-CM

## 2022-01-16 DIAGNOSIS — E782 Mixed hyperlipidemia: Secondary | ICD-10-CM

## 2022-01-16 DIAGNOSIS — E781 Pure hyperglyceridemia: Secondary | ICD-10-CM

## 2022-01-16 DIAGNOSIS — E669 Obesity, unspecified: Secondary | ICD-10-CM

## 2022-01-16 DIAGNOSIS — E8881 Metabolic syndrome: Secondary | ICD-10-CM

## 2022-01-16 LAB — COMPLETE METABOLIC PANEL WITH GFR
AG Ratio: 1.6 (calc) (ref 1.0–2.5)
ALT: 12 U/L (ref 9–46)
AST: 21 U/L (ref 10–40)
Albumin: 4.5 g/dL (ref 3.6–5.1)
Alkaline phosphatase (APISO): 72 U/L (ref 36–130)
BUN: 20 mg/dL (ref 7–25)
CO2: 26 mmol/L (ref 20–32)
Calcium: 9.7 mg/dL (ref 8.6–10.3)
Chloride: 104 mmol/L (ref 98–110)
Creat: 1.12 mg/dL (ref 0.60–1.29)
Globulin: 2.8 g/dL (calc) (ref 1.9–3.7)
Glucose, Bld: 96 mg/dL (ref 65–99)
Potassium: 4.4 mmol/L (ref 3.5–5.3)
Sodium: 137 mmol/L (ref 135–146)
Total Bilirubin: 0.3 mg/dL (ref 0.2–1.2)
Total Protein: 7.3 g/dL (ref 6.1–8.1)
eGFR: 81 mL/min/{1.73_m2} (ref >=60)

## 2022-01-16 LAB — LIPID PANEL W/REFLEX DIRECT LDL
Cholesterol: 225 mg/dL — ABNORMAL HIGH (ref <200)
HDL: 34 mg/dL — ABNORMAL LOW (ref >=40)
LDL Cholesterol (Calc): 131 mg/dL (calc) — ABNORMAL HIGH
Non-HDL Cholesterol (Calc): 191 mg/dL (calc) — ABNORMAL HIGH (ref <130)
Total CHOL/HDL Ratio: 6.6 (calc) — ABNORMAL HIGH (ref <5.0)
Triglycerides: 393 mg/dL — ABNORMAL HIGH (ref <150)

## 2022-01-16 LAB — HEMOGLOBIN A1C
Hgb A1c MFr Bld: 5.6 % of total Hgb (ref <5.7)
Mean Plasma Glucose: 114 mg/dL
eAG (mmol/L): 6.3 mmol/L

## 2022-01-16 LAB — INSULIN, FREE (BIOACTIVE): Insulin, Free: 37.5 u[IU]/mL — ABNORMAL HIGH (ref 1.5–14.9)

## 2022-01-16 MED ORDER — SEMAGLUTIDE-WEIGHT MANAGEMENT 1 MG/0.5ML ~~LOC~~ SOAJ: 1.0000 mg | SUBCUTANEOUS | 3 refills | Status: DC

## 2022-01-16 MED ORDER — SEMAGLUTIDE-WEIGHT MANAGEMENT 0.25 MG/0.5ML ~~LOC~~ SOAJ: 0.2500 mg | SUBCUTANEOUS | 0 refills | Status: AC

## 2022-01-16 MED ORDER — SEMAGLUTIDE-WEIGHT MANAGEMENT 0.5 MG/0.5ML ~~LOC~~ SOAJ: 0.5000 mg | SUBCUTANEOUS | 0 refills | Status: AC

## 2022-01-21 ENCOUNTER — Encounter: Payer: Self-pay | Admitting: Family Medicine

## 2022-01-21 DIAGNOSIS — E88819 Insulin resistance, unspecified: Secondary | ICD-10-CM

## 2022-01-21 DIAGNOSIS — E781 Pure hyperglyceridemia: Secondary | ICD-10-CM

## 2022-01-21 DIAGNOSIS — R7303 Prediabetes: Secondary | ICD-10-CM

## 2022-01-21 DIAGNOSIS — E8881 Metabolic syndrome: Secondary | ICD-10-CM

## 2022-01-22 ENCOUNTER — Other Ambulatory Visit: Payer: Self-pay

## 2022-01-22 DIAGNOSIS — E782 Mixed hyperlipidemia: Secondary | ICD-10-CM

## 2022-01-22 MED ORDER — ATORVASTATIN CALCIUM 40 MG PO TABS: ORAL_TABLET | ORAL | 3 refills | Status: DC

## 2022-01-25 ENCOUNTER — Ambulatory Visit
Admission: RE | Admit: 2022-01-25 | Discharge: 2022-01-25 | Disposition: A | Discharge status: Home / Self Care | Payer: BC Managed Care – PPO | Attending: Gastroenterology | Admitting: Gastroenterology

## 2022-01-25 ENCOUNTER — Ambulatory Visit: Payer: BC Managed Care – PPO | Admitting: Family Medicine

## 2022-01-25 ENCOUNTER — Ambulatory Visit: Payer: BC Managed Care – PPO | Admitting: Anesthesiology

## 2022-01-25 ENCOUNTER — Encounter: Payer: Self-pay | Admitting: Gastroenterology

## 2022-01-25 ENCOUNTER — Encounter
Admission: RE | Disposition: A | Discharge status: Home / Self Care | Payer: Self-pay | Source: Home / Self Care | Attending: Gastroenterology

## 2022-01-25 ENCOUNTER — Other Ambulatory Visit: Payer: Self-pay

## 2022-01-25 DIAGNOSIS — K219 Gastro-esophageal reflux disease without esophagitis: Secondary | ICD-10-CM | POA: Insufficient documentation

## 2022-01-25 DIAGNOSIS — Z6833 Body mass index (BMI) 33.0-33.9, adult: Secondary | ICD-10-CM | POA: Diagnosis not present

## 2022-01-25 DIAGNOSIS — F17210 Nicotine dependence, cigarettes, uncomplicated: Secondary | ICD-10-CM | POA: Insufficient documentation

## 2022-01-25 DIAGNOSIS — K635 Polyp of colon: Secondary | ICD-10-CM | POA: Diagnosis not present

## 2022-01-25 DIAGNOSIS — R519 Headache, unspecified: Secondary | ICD-10-CM | POA: Insufficient documentation

## 2022-01-25 DIAGNOSIS — Z1211 Encounter for screening for malignant neoplasm of colon: Secondary | ICD-10-CM | POA: Insufficient documentation

## 2022-01-25 DIAGNOSIS — E785 Hyperlipidemia, unspecified: Secondary | ICD-10-CM | POA: Diagnosis not present

## 2022-01-25 DIAGNOSIS — Z860100 Personal history of colon polyps, unspecified: Secondary | ICD-10-CM

## 2022-01-25 DIAGNOSIS — D124 Benign neoplasm of descending colon: Secondary | ICD-10-CM | POA: Diagnosis not present

## 2022-01-25 DIAGNOSIS — D125 Benign neoplasm of sigmoid colon: Secondary | ICD-10-CM | POA: Diagnosis not present

## 2022-01-25 DIAGNOSIS — G4730 Sleep apnea, unspecified: Secondary | ICD-10-CM | POA: Insufficient documentation

## 2022-01-25 DIAGNOSIS — I10 Essential (primary) hypertension: Secondary | ICD-10-CM | POA: Insufficient documentation

## 2022-01-25 DIAGNOSIS — D122 Benign neoplasm of ascending colon: Secondary | ICD-10-CM | POA: Diagnosis not present

## 2022-01-25 DIAGNOSIS — E669 Obesity, unspecified: Secondary | ICD-10-CM | POA: Diagnosis not present

## 2022-01-25 DIAGNOSIS — D126 Benign neoplasm of colon, unspecified: Secondary | ICD-10-CM | POA: Diagnosis not present

## 2022-01-25 HISTORY — PX: COLONOSCOPY WITH PROPOFOL: SHX5780

## 2022-01-25 SURGERY — COLONOSCOPY WITH PROPOFOL
Anesthesia: General

## 2022-01-25 MED ORDER — PROPOFOL 10 MG/ML IV BOLUS
INTRAVENOUS | Status: DC | PRN
  Administered 2022-01-25: 100 mg via INTRAVENOUS

## 2022-01-25 MED ORDER — SODIUM CHLORIDE 0.9 % IV SOLN: INTRAVENOUS | Status: DC

## 2022-01-25 MED ORDER — PROPOFOL 500 MG/50ML IV EMUL
INTRAVENOUS | Status: DC | PRN
  Administered 2022-01-25: 150 ug/kg/min via INTRAVENOUS

## 2022-01-25 MED ORDER — STERILE WATER FOR IRRIGATION IR SOLN
Status: DC | PRN
  Administered 2022-01-25: 120 mL

## 2022-01-25 MED ORDER — LIDOCAINE HCL (CARDIAC) PF 100 MG/5ML IV SOSY
PREFILLED_SYRINGE | INTRAVENOUS | Status: DC | PRN
  Administered 2022-01-25: 100 mg via INTRAVENOUS

## 2022-01-25 NOTE — Anesthesia Postprocedure Evaluation (Signed)
Anesthesia Post Note  Patient: Jeffery [name]  Procedure(s) Performed: COLONOSCOPY WITH PROPOFOL  Patient location during evaluation: Endoscopy Anesthesia Type: General Level of consciousness: awake and alert Pain management: pain level controlled Vital Signs Assessment: post-procedure vital signs reviewed and stable Respiratory status: spontaneous breathing, nonlabored ventilation, respiratory function stable and patient connected to nasal cannula oxygen Cardiovascular status: blood pressure returned to baseline and stable Postop Assessment: no apparent nausea or vomiting Anesthetic complications: no   No notable events documented.   Last Vitals:  Vitals:   01/25/22 1033 01/25/22 1043  BP: (!) 118/93 (!) 129/95  Pulse: 88 89  Resp: (!) 22 20  Temp:    SpO2: 98% 99%    Last Pain:  Vitals:   01/25/22 1043  TempSrc:   PainSc: 0-No pain                 Precious Haws Karlos Scadden

## 2022-01-25 NOTE — H&P (Signed)
Jonathon Bellows, MD [address], [place], [place], [place], [address] [address], [place], [place], [place], [address] Phone: [phone]  Fax: [phone]  Primary Care Physician:  Delsa Grana, PA-C   Pre-Procedure History & Physical: HPI:  Jeffery [name] is a 51 y.o. male is here for an colonoscopy.   Past Medical History:  Diagnosis Date   Biceps tendon rupture, right, subsequent encounter 03/20/2017   Dental crowns present    sides and back   Dyslipidemia    Enlarged prostate    Epididymitis    Headache    sinus   Hypotestosteronism    Lower urinary tract symptoms (LUTS) 06/14/2016   Nodular prostate with lower urinary tract symptoms    Obesity    Testosterone deficiency    Umbilical hernia    Umbilical hernia without obstruction or gangrene     Past Surgical History:  Procedure Laterality Date   HERNIA REPAIR  05/17/53   umbilical hernia   NASAL SEPTOPLASTY W/ TURBINOPLASTY Bilateral 11/23/2014   Procedure: NASAL SEPTOPLASTY WITH TURBINATE REDUCTION;  Surgeon: Carloyn Manner, MD;  Location: [facility];  Service: ENT;  Laterality: Bilateral;   PROSTATE BIOPSY  2015   ROOT CANAL     WRIST FRACTURE SURGERY Right     Prior to Admission medications   Medication Sig Start Date End Date Taking? Authorizing Provider  atorvastatin (LIPITOR) 40 MG tablet TAKE 1 TABLET(40 MG) BY MOUTH DAILY 01/22/22   Delsa Grana, PA-C  ipratropium (ATROVENT) 0.06 % nasal spray Place 2 sprays into both nostrils 4 (four) times daily. Patient not taking: Reported on 01/04/2022 12/31/20   Margarette Canada, NP  lisinopril (ZESTRIL) 10 MG tablet Take one tablet by mouth daily 01/04/22   Delsa Grana, PA-C  omeprazole (PRILOSEC) 20 MG capsule Take 1 capsule (20 mg total) by mouth daily. 01/04/22   Delsa Grana, PA-C  Semaglutide-Weight Management 0.25 MG/0.5ML SOAJ Inject 0.25 mg into the skin once a week for 28 days. 01/16/22 02/13/22  Delsa Grana, PA-C  Semaglutide-Weight  Management 0.5 MG/0.5ML SOAJ Inject 0.5 mg into the skin once a week for 28 days. 02/14/22 03/14/22  Delsa Grana, PA-C  Semaglutide-Weight Management 1 MG/0.5ML SOAJ Inject 1 mg into the skin once a week. 03/15/22   Delsa Grana, PA-C  sildenafil (VIAGRA) 100 MG tablet Take 1 tablet (100 mg total) by mouth daily as needed for erectile dysfunction. Take two hours prior to intercourse on an empty stomach 11/16/21   Zara Council A, PA-C  tadalafil (CIALIS) 10 MG tablet Take 1 tablet (10 mg total) by mouth daily as needed for erectile dysfunction. Patient not taking: Reported on 01/04/2022 03/14/20   Zara Council A, PA-C  tadalafil (CIALIS) 20 MG tablet Take 1 tablet (20 mg total) by mouth daily as needed for erectile dysfunction. 11/16/21   Zara Council A, PA-C  tadalafil (CIALIS) 5 MG tablet Take 1 tablet (5 mg total) by mouth daily. Patient not taking: Reported on 01/04/2022 11/16/21   Zara Council A, PA-C  testosterone (ANDROGEL) 50 MG/5GM (1%) GEL APPLY THE CONTENTS OF 2 PACKETS TO CLEAN, DRY AND INTACT SKIN DAILY( AVOID FACE AND SCROTUM) 01/07/22   McGowan, Larene Beach A, PA-C    Allergies as of 01/04/2022 - Review Complete 01/04/2022  Allergen Reaction Noted   Sulfa antibiotics Nausea Only 08/24/2018   Doxycycline Other (See Comments) and Nausea And Vomiting 07/31/2015    Family History  Problem Relation Age of Onset   Diabetes Mother  Cancer Mother        Colon Cancer   Hypertension Father    Cancer Maternal Grandmother        Lung-  Non-Smoker    Social History   Socioeconomic History   Marital status: Married    Spouse name: [name]   Number of children: 3   Years of education: Not on file   Highest education level: High school graduate  Occupational History   Occupation: Armed forces training and education officer  Tobacco Use   Smoking status: Every Day    Packs/day: 1.00    Years: 30.00    Total pack years: 30.00    Types: Cigarettes, E-cigarettes   Smokeless tobacco: Never  Vaping  Use   Vaping Use: Every day  Substance and Sexual Activity   Alcohol use: No    Alcohol/week: 0.0 standard drinks of alcohol   Drug use: No   Sexual activity: Yes    Partners: Female  Other Topics Concern   Not on file  Social History Narrative   Not on file   Social Determinants of Health   Financial Resource Strain: Low Risk  (09/04/2020)   Overall Financial Resource Strain (CARDIA)    Difficulty of Paying Living Expenses: Not hard at all  Food Insecurity: No Food Insecurity (09/04/2020)   Hunger Vital Sign    Worried About Running Out of Food in the Last Year: Never true    Ran Out of Food in the Last Year: Never true  Transportation Needs: No Transportation Needs (09/04/2020)   PRAPARE - Hydrologist (Medical): No    Lack of Transportation (Non-Medical): No  Physical Activity: Insufficiently Active (09/04/2020)   Exercise Vital Sign    Days of Exercise per Week: 3 days    Minutes of Exercise per Session: 30 min  Stress: No Stress Concern Present (09/04/2020)   [name]    Feeling of Stress : Not at all  Social Connections: Moderately Integrated (09/04/2020)   Social Connection and Isolation Panel [NHANES]    Frequency of Communication with Friends and Family: More than three times a week    Frequency of Social Gatherings with Friends and Family: Once a week    Attends Religious Services: 1 to 4 times per year    Active Member of [employer] or Organizations: No    Attends Archivist Meetings: Never    Marital Status: Married  Human resources officer Violence: Not At Risk (09/04/2020)   Humiliation, Afraid, Rape, and Kick questionnaire    Fear of Current or Ex-Partner: No    Emotionally Abused: No    Physically Abused: No    Sexually Abused: No    Review of Systems: See HPI, otherwise negative ROS  Physical Exam: There were no vitals taken for this visit. General:   Alert,   pleasant and cooperative in NAD Head:  Normocephalic and atraumatic. Neck:  Supple; no masses or thyromegaly. Lungs:  Clear throughout to auscultation, normal respiratory effort.    Heart:  +S1, +S2, Regular rate and rhythm, No edema. Abdomen:  Soft, nontender and nondistended. Normal bowel sounds, without guarding, and without rebound.   Neurologic:  Alert and  oriented x4;  grossly normal neurologically.  Impression/Plan: [name] is here for an colonoscopy to be performed for surveillance due to prior history of colon polyps   Risks, benefits, limitations, and alternatives regarding  colonoscopy have been reviewed with the patient.  Questions have  been answered.  All parties agreeable.   Jonathon Bellows, MD  01/25/2022, 9:37 AM

## 2022-01-25 NOTE — Anesthesia Preprocedure Evaluation (Signed)
Anesthesia Evaluation  Patient identified by MRN, date of birth, ID band Patient awake    Reviewed: Allergy & Precautions, NPO status , Patient's Chart, lab work & pertinent test results  History of Anesthesia Complications Negative for: history of anesthetic complications  Airway Mallampati: III  TM Distance: <3 FB Neck ROM: full    Dental  (+) Chipped, Poor Dentition   Pulmonary neg shortness of breath, sleep apnea , Current Smoker and Patient abstained from smoking.   Pulmonary exam normal        Cardiovascular hypertension, (-) angina Normal cardiovascular exam     Neuro/Psych  Headaches  negative psych ROS   GI/Hepatic Neg liver ROS,GERD  Controlled,,  Endo/Other  negative endocrine ROS    Renal/GU negative Renal ROS  negative genitourinary   Musculoskeletal   Abdominal   Peds  Hematology negative hematology ROS (+)   Anesthesia Other Findings Past Medical History: 03/20/2017: Biceps tendon rupture, right, subsequent encounter No date: Dental crowns present     Comment:  sides and back No date: Dyslipidemia No date: Enlarged prostate No date: Epididymitis No date: Headache     Comment:  sinus No date: Hypotestosteronism 06/14/2016: Lower urinary tract symptoms (LUTS) No date: Nodular prostate with lower urinary tract symptoms No date: Obesity No date: Testosterone deficiency No date: Umbilical hernia No date: Umbilical hernia without obstruction or gangrene  Past Surgical History: 03/11/14: HERNIA REPAIR     Comment:  umbilical hernia 1/61/0960: NASAL SEPTOPLASTY W/ TURBINOPLASTY; Bilateral     Comment:  Procedure: NASAL SEPTOPLASTY WITH TURBINATE REDUCTION;                Surgeon: Carloyn Manner, MD;  Location: [facility];  Service: ENT;  Laterality: Bilateral; 2015: PROSTATE BIOPSY No date: ROOT CANAL No date: WRIST FRACTURE SURGERY; Right      Reproductive/Obstetrics negative OB ROS                             Anesthesia Physical Anesthesia Plan  ASA: 3  Anesthesia Plan: General   Post-op Pain Management:    Induction: Intravenous  PONV Risk Score and Plan: Propofol infusion and TIVA  Airway Management Planned: Natural Airway and Nasal Cannula  Additional Equipment:   Intra-op Plan:   Post-operative Plan:   Informed Consent: I have reviewed the patients History and Physical, chart, labs and discussed the procedure including the risks, benefits and alternatives for the proposed anesthesia with the patient or authorized representative who has indicated his/her understanding and acceptance.     Dental Advisory Given  Plan Discussed with: Anesthesiologist, CRNA and Surgeon  Anesthesia Plan Comments: (Patient consented for risks of anesthesia including but not limited to:  - adverse reactions to medications - risk of airway placement if required - damage to eyes, teeth, lips or other oral mucosa - nerve damage due to positioning  - sore throat or hoarseness - Damage to heart, brain, nerves, lungs, other parts of body or loss of life  Patient voiced understanding.)       Anesthesia Quick Evaluation

## 2022-01-25 NOTE — Op Note (Signed)
[facility] Gastroenterology Patient Name: Jeffery [name] Procedure Date: 01/25/2022 9:39 AM MRN: [id number] Account #: [id number] Date of Birth: [birth year] Admit Type: Outpatient Age: 51 Room: [facility] ENDO ROOM 4 Gender: Male Note Status: Finalized Instrument Name: Park Meo [id number] Procedure:             Colonoscopy Indications:           Surveillance: Personal history of colonic polyps                         (unknown histology) on last colonoscopy 5 years ago Providers:             Jonathon Bellows MD, MD Referring MD:          Delsa Grana (Referring MD) Medicines:             Monitored Anesthesia Care Complications:         No immediate complications. Procedure:             Pre-Anesthesia Assessment:                        - Prior to the procedure, a History and Physical was                         performed, and patient medications, allergies and                         sensitivities were reviewed. The patient's tolerance                         of previous anesthesia was reviewed.                        - The risks and benefits of the procedure and the                         sedation options and risks were discussed with the                         patient. All questions were answered and informed                         consent was obtained.                        - ASA Grade Assessment: II - A patient with mild                         systemic disease.                        After obtaining informed consent, the colonoscope was                         passed under direct vision. Throughout the procedure,                         the patient's blood pressure, pulse, and oxygen  saturations were monitored continuously. The                         Colonoscope was introduced through the anus and                         advanced to the the cecum, identified by the                         appendiceal orifice. The colonoscopy was performed                          with ease. The patient tolerated the procedure well.                         The quality of the bowel preparation was excellent.                         The appendiceal orifice was photographed. Findings:      The perianal and digital rectal examinations were normal.      Two sessile polyps were found in the sigmoid colon and descending colon.       The polyps were 5 to 7 mm in size. These polyps were removed with a cold       snare. Resection and retrieval were complete.      Three sessile polyps were found in the ascending colon. The polyps were       8 to 12 mm in size. These polyps were removed with a cold snare.       Resection and retrieval were complete.      The exam was otherwise without abnormality on direct and retroflexion       views. Impression:            - Two 5 to 7 mm polyps in the sigmoid colon and in the                         descending colon, removed with a cold snare. Resected                         and retrieved.                        - Three 8 to 12 mm polyps in the ascending colon,                         removed with a cold snare. Resected and retrieved.                        - The examination was otherwise normal on direct and                         retroflexion views. Recommendation:        - Discharge patient to home (with escort).                        - Resume previous diet.                        -  Continue present medications.                        - Await pathology results.                        - Repeat colonoscopy in 3 years for surveillance. Procedure Code(s):     --- Professional ---                        [phone], Colonoscopy, flexible; with removal of                         tumor(s), polyp(s), or other lesion(s) by snare                         technique Diagnosis Code(s):     --- Professional ---                        Z86.010, Personal history of colonic polyps                        D12.5, Benign neoplasm of sigmoid  colon                        D12.4, Benign neoplasm of descending colon                        D12.2, Benign neoplasm of ascending colon CPT copyright 2022 American Medical Association. All rights reserved. The codes documented in this report are preliminary and upon coder review may  be revised to meet current compliance requirements. Jonathon Bellows, MD Jonathon Bellows MD, MD 01/25/2022 10:22:32 AM This report has been signed electronically. Number of Addenda: 0 Note Initiated On: 01/25/2022 9:39 AM Scope Withdrawal Time: 0 hours 19 minutes 24 seconds  Total Procedure Duration: 0 hours 21 minutes 49 seconds  Estimated Blood Loss:  Estimated blood loss: none.      [facility]

## 2022-01-25 NOTE — Transfer of Care (Signed)
Immediate Anesthesia Transfer of Care Note  Patient: Jeffery [name]  Procedure(s) Performed: COLONOSCOPY WITH PROPOFOL  Patient Location: Endoscopy Unit  Anesthesia Type:General  Level of Consciousness: drowsy  Airway & Oxygen Therapy: Patient Spontanous Breathing  Post-op Assessment: Report given to RN  Post vital signs: stable  Last Vitals:  Vitals Value Taken Time  BP    Temp    Pulse    Resp    SpO2      Last Pain:  Vitals:   01/25/22 0940  TempSrc: Temporal  PainSc: 0-No pain         Complications: No notable events documented.

## 2022-01-28 LAB — SURGICAL PATHOLOGY

## 2022-01-29 DIAGNOSIS — R7303 Prediabetes: Secondary | ICD-10-CM | POA: Insufficient documentation

## 2022-01-29 MED ORDER — METFORMIN HCL 500 MG PO TABS: 1000.0000 mg | ORAL_TABLET | Freq: Two times a day (BID) | ORAL | 1 refills | Status: DC

## 2022-01-29 NOTE — Telephone Encounter (Signed)
See pt msgs and conversation New onset prediabetes with last labs, prior hx of metabolic syndrome and we did reconfirm insulin resistance Pt inquired about starting metformin when ozempic was unavailable.   ICD-10-CM   1. Insulin resistance  E88.819 metFORMIN (GLUCOPHAGE) 500 MG tablet    2. Hypertriglyceridemia  E78.1 metFORMIN (GLUCOPHAGE) 500 MG tablet    3. Metabolic syndrome  M62.863 metFORMIN (GLUCOPHAGE) 500 MG tablet    4. Prediabetes  R73.03 metFORMIN (GLUCOPHAGE) 500 MG tablet     Pt instructed to start med low dose and slowly increase dose every 1-2 weeks if tolerating, take with food F/up in office in 3 months   Delsa Grana, PA-C

## 2022-02-14 ENCOUNTER — Encounter: Payer: Self-pay | Admitting: Gastroenterology

## 2022-04-15 DIAGNOSIS — E785 Hyperlipidemia, unspecified: Secondary | ICD-10-CM | POA: Diagnosis not present

## 2022-04-15 DIAGNOSIS — R112 Nausea with vomiting, unspecified: Secondary | ICD-10-CM | POA: Diagnosis not present

## 2022-04-15 DIAGNOSIS — R1031 Right lower quadrant pain: Secondary | ICD-10-CM | POA: Diagnosis not present

## 2022-04-15 DIAGNOSIS — I10 Essential (primary) hypertension: Secondary | ICD-10-CM | POA: Diagnosis not present

## 2022-04-15 DIAGNOSIS — Z882 Allergy status to sulfonamides status: Secondary | ICD-10-CM | POA: Diagnosis not present

## 2022-04-15 DIAGNOSIS — F17210 Nicotine dependence, cigarettes, uncomplicated: Secondary | ICD-10-CM | POA: Diagnosis not present

## 2022-04-15 DIAGNOSIS — Z881 Allergy status to other antibiotic agents status: Secondary | ICD-10-CM | POA: Diagnosis not present

## 2022-04-15 DIAGNOSIS — Z79899 Other long term (current) drug therapy: Secondary | ICD-10-CM | POA: Diagnosis not present

## 2022-04-15 DIAGNOSIS — R197 Diarrhea, unspecified: Secondary | ICD-10-CM | POA: Diagnosis not present

## 2022-04-15 DIAGNOSIS — N401 Enlarged prostate with lower urinary tract symptoms: Secondary | ICD-10-CM | POA: Diagnosis not present

## 2022-04-17 ENCOUNTER — Encounter: Payer: Self-pay | Admitting: Nurse Practitioner

## 2022-04-17 ENCOUNTER — Other Ambulatory Visit: Payer: Self-pay

## 2022-04-17 ENCOUNTER — Ambulatory Visit (INDEPENDENT_AMBULATORY_CARE_PROVIDER_SITE_OTHER): Payer: BC Managed Care – PPO | Admitting: Nurse Practitioner

## 2022-04-17 VITALS — BP 128/72 | HR 95 | Temp 97.9°F | Resp 18 | Ht 71.0 in | Wt 247.6 lb

## 2022-04-17 DIAGNOSIS — R1031 Right lower quadrant pain: Secondary | ICD-10-CM

## 2022-04-17 DIAGNOSIS — J069 Acute upper respiratory infection, unspecified: Secondary | ICD-10-CM

## 2022-04-17 LAB — POCT INFLUENZA A/B
Influenza A, POC: NEGATIVE
Influenza B, POC: NEGATIVE

## 2022-04-17 MED ORDER — BENZONATATE 100 MG PO CAPS: 200.0000 mg | ORAL_CAPSULE | Freq: Two times a day (BID) | ORAL | 0 refills | Status: DC | PRN

## 2022-04-17 NOTE — Progress Notes (Signed)
BP 128/72   Pulse 95   Temp 97.9 F (36.6 C) (Oral)   Resp 18   Ht 5' 11"$  (1.803 m)   Wt 247 lb 9.6 oz (112.3 kg)   SpO2 97%   BMI 34.53 kg/m    Subjective:    Patient ID: Jeffery [name], Jeffery [name]    DOB: 05/20/1970, 52 y.o.   MRN: [id number]  HPI: Jeffery [name] is a 52 y.o. Jeffery [name]  Chief Complaint  Patient presents with   Follow-up    [facility] [place]   Emesis   Diarrhea   URI    [name], congested, headache, bodyache   Er follow up/abdominal pain: patient was seen in the er at [facility] on 04/15/2022. He was seen for RLQ abdominal pain, nausea, vomiting and diarrhea.  He says symptoms started 4 pm on Sunday. Ct abd/pelvis --No acute abnormality in the abdomen or pelvis, Other incidental or chronic findings,  There are a few prominent, mildly dilated loops of small bowel in the left upper quadrant that gradually tapers to normal caliber small bowel. Colon diverticulosis without evidence of diverticulitis. No findings of bowel obstruction or acute inflammation. Normal appendix. AST was elevated, BUN/creatinine was elevated 28/1.66, GFR 50., WBC was elevated at 18.2.  discharged with bentyl and zofran.  Today patient reports he is doing better.  He says that he is feeling better, still sore. He is drinking lots of fluids. Will recheck labs.    URI: He reports nasal congestion, [name], fatigue, body aches.  He says these symptoms started on Tuesday.  He denies any shortness of breath.  He says that he has taken mucinex and nyquil.  Will get flu and covid.  He did do two home covid tests which were negative.  Flu negative, covid pending.   Recommend taking zyrtec, flonase, mucinex, vitamin d, vitamin c, and zinc. Push fluids and get rest.     Relevant past medical, surgical, family and social history reviewed and updated as indicated. Interim medical history since our last visit reviewed. Allergies and medications reviewed and updated.  Review of Systems  Constitutional: Negative for  fever or weight change.  Respiratory: Negative for [name] and shortness of breath.   Cardiovascular: Negative for chest pain or palpitations.  Gastrointestinal: Negative for abdominal pain, no bowel changes.  Musculoskeletal: Negative for gait problem or joint swelling.  Skin: Negative for rash.  Neurological: Negative for dizziness or headache.  No other specific complaints in a complete review of systems (except as listed in HPI above).      Objective:    BP 128/72   Pulse 95   Temp 97.9 F (36.6 C) (Oral)   Resp 18   Ht 5' 11"$  (1.803 m)   Wt 247 lb 9.6 oz (112.3 kg)   SpO2 97%   BMI 34.53 kg/m   Wt Readings from Last 3 Encounters:  04/17/22 247 lb 9.6 oz (112.3 kg)  01/25/22 243 lb (110.2 kg)  01/04/22 248 lb 4.8 oz (112.6 kg)    Physical Exam  Constitutional: Patient appears well-developed and well-nourished. Obese  No distress.  HEENT: head atraumatic, normocephalic, pupils equal and reactive to light, ears TMs clear, neck supple, throat within normal limits Cardiovascular: Normal rate, regular rhythm and normal heart sounds.  No murmur heard. No BLE edema. Pulmonary/Chest: Effort normal and breath sounds normal. No respiratory distress. Abdominal: Soft.  There is no tenderness. Psychiatric: Patient has a normal mood and affect. behavior is normal. Judgment and thought  content normal.     Assessment & Plan:   Problem List Items Addressed This Visit   None Visit Diagnoses     Viral upper respiratory tract infection    -  Primary   Recommend taking zyrtec, flonase, mucinex, vitamin d, vitamin c, and zinc. Push fluids and get rest.   Relevant Medications   benzonatate (TESSALON) 100 MG capsule   Other Relevant Orders   POCT Influenza A/B (Completed)   Novel Coronavirus, NAA ([facility])   Right lower quadrant abdominal pain       improved, will recheck labs   Relevant Orders   CBC with Differential/Platelet   COMPLETE METABOLIC PANEL WITH GFR        Follow up  plan: Return if symptoms worsen or fail to improve.

## 2022-04-18 LAB — COMPLETE METABOLIC PANEL WITH GFR
AG Ratio: 1.5 (calc) (ref 1.0–2.5)
ALT: 20 U/L (ref 9–46)
AST: 27 U/L (ref 10–40)
Albumin: 4 g/dL (ref 3.6–5.1)
Alkaline phosphatase (APISO): 56 U/L (ref 36–130)
BUN: 21 mg/dL (ref 7–25)
CO2: 27 mmol/L (ref 20–32)
Calcium: 8.8 mg/dL (ref 8.6–10.3)
Chloride: 104 mmol/L (ref 98–110)
Creat: 1.16 mg/dL (ref 0.60–1.29)
Globulin: 2.6 g/dL (calc) (ref 1.9–3.7)
Glucose, Bld: 84 mg/dL (ref 65–99)
Potassium: 4.2 mmol/L (ref 3.5–5.3)
Sodium: 140 mmol/L (ref 135–146)
Total Bilirubin: 0.3 mg/dL (ref 0.2–1.2)
Total Protein: 6.6 g/dL (ref 6.1–8.1)
eGFR: 77 mL/min/{1.73_m2} (ref >=60)

## 2022-04-18 LAB — CBC WITH DIFFERENTIAL/PLATELET
Absolute Monocytes: 963 cells/uL — ABNORMAL HIGH (ref 200–950)
Basophils Absolute: 52 cells/uL (ref 0–200)
Basophils Relative: 0.6 %
Eosinophils Absolute: 129 cells/uL (ref 15–500)
Eosinophils Relative: 1.5 %
HCT: 43.9 % (ref 38.5–50.0)
Hemoglobin: 14.9 g/dL (ref 13.2–17.1)
Lymphs Abs: 3062 cells/uL (ref 850–3900)
MCH: 29.6 pg (ref 27.0–33.0)
MCHC: 33.9 g/dL (ref 32.0–36.0)
MCV: 87.3 fL (ref 80.0–100.0)
MPV: 10.4 fL (ref 7.5–12.5)
Monocytes Relative: 11.2 %
Neutro Abs: 4395 cells/uL (ref 1500–7800)
Neutrophils Relative %: 51.1 %
Platelets: 255 10*3/uL (ref 140–400)
RBC: 5.03 10*6/uL (ref 4.20–5.80)
RDW: 13.1 % (ref 11.0–15.0)
Total Lymphocyte: 35.6 %
WBC: 8.6 10*3/uL (ref 3.8–10.8)

## 2022-04-18 LAB — NOVEL CORONAVIRUS, NAA: SARS-CoV-2, NAA: NOT DETECTED

## 2022-04-26 ENCOUNTER — Other Ambulatory Visit: Payer: Self-pay | Admitting: Family Medicine

## 2022-04-26 DIAGNOSIS — E781 Pure hyperglyceridemia: Secondary | ICD-10-CM

## 2022-04-26 DIAGNOSIS — R7303 Prediabetes: Secondary | ICD-10-CM

## 2022-04-26 DIAGNOSIS — E8881 Metabolic syndrome: Secondary | ICD-10-CM

## 2022-04-26 DIAGNOSIS — E88819 Insulin resistance, unspecified: Secondary | ICD-10-CM

## 2022-05-20 ENCOUNTER — Other Ambulatory Visit: Payer: BC Managed Care – PPO

## 2022-05-20 ENCOUNTER — Telehealth: Payer: Self-pay | Admitting: Urology

## 2022-05-20 NOTE — Telephone Encounter (Signed)
Patient called regarding his lab appointment that he missed today. He is requesting to have his labs drawn at [place] at [facility] due to his insurance coverage. Please let him know if order can be sent there.

## 2022-05-20 NOTE — Telephone Encounter (Signed)
Spoke to patient and informed him that I can't order labs for another doctors office to draw. I did offer to order the labs for [facility] and he could go to a stand alone location to have then drawn. He states he will just call back and schedule to lab appointment at our location.

## 2022-05-31 ENCOUNTER — Other Ambulatory Visit: Payer: BC Managed Care – PPO

## 2022-06-05 ENCOUNTER — Other Ambulatory Visit: Payer: BC Managed Care – PPO

## 2022-06-06 ENCOUNTER — Other Ambulatory Visit: Payer: BC Managed Care – PPO

## 2022-06-06 DIAGNOSIS — E349 Endocrine disorder, unspecified: Secondary | ICD-10-CM

## 2022-06-06 DIAGNOSIS — N400 Enlarged prostate without lower urinary tract symptoms: Secondary | ICD-10-CM | POA: Diagnosis not present

## 2022-06-07 ENCOUNTER — Telehealth: Payer: Self-pay | Admitting: Family Medicine

## 2022-06-07 LAB — HEMOGLOBIN AND HEMATOCRIT, BLOOD
Hematocrit: 43.4 % (ref 37.5–51.0)
Hemoglobin: 14.8 g/dL (ref 13.0–17.7)

## 2022-06-07 LAB — TESTOSTERONE: Testosterone: 162 ng/dL — ABNORMAL LOW (ref 264–916)

## 2022-06-07 LAB — PSA: Prostate Specific Ag, Serum: 0.7 ng/mL (ref 0.0–4.0)

## 2022-06-07 NOTE — Telephone Encounter (Signed)
Patient states he has been using the gel daily.

## 2022-06-07 NOTE — Telephone Encounter (Signed)
I would suggest increasing to 3 pumps daily and rotating application sites and that we should recheck the testosterone level in 1 month.   Patient states he used the packets because his insurance will cover it at a cheaper cost.

## 2022-06-07 NOTE — Telephone Encounter (Signed)
Patient notified and appointment scheduled. 

## 2022-06-07 NOTE — Telephone Encounter (Signed)
-----   Message from Harle Battiest, PA-C sent at 06/07/2022  8:18 AM EDT ----- Is he applying the testosterone gel?

## 2022-06-12 ENCOUNTER — Telehealth (INDEPENDENT_AMBULATORY_CARE_PROVIDER_SITE_OTHER): Payer: BC Managed Care – PPO | Admitting: Family Medicine

## 2022-06-12 ENCOUNTER — Encounter: Payer: Self-pay | Admitting: Family Medicine

## 2022-06-12 DIAGNOSIS — J302 Other seasonal allergic rhinitis: Secondary | ICD-10-CM

## 2022-06-12 DIAGNOSIS — R5383 Other fatigue: Secondary | ICD-10-CM

## 2022-06-12 DIAGNOSIS — Z9889 Other specified postprocedural states: Secondary | ICD-10-CM | POA: Diagnosis not present

## 2022-06-12 DIAGNOSIS — J329 Chronic sinusitis, unspecified: Secondary | ICD-10-CM

## 2022-06-12 MED ORDER — LEVOCETIRIZINE DIHYDROCHLORIDE 5 MG PO TABS: 5.0000 mg | ORAL_TABLET | Freq: Every evening | ORAL | 2 refills | Status: DC

## 2022-06-12 MED ORDER — AMOXICILLIN-POT CLAVULANATE 875-125 MG PO TABS: 1.0000 | ORAL_TABLET | Freq: Two times a day (BID) | ORAL | 0 refills | Status: AC

## 2022-06-12 MED ORDER — FLUTICASONE PROPIONATE 50 MCG/ACT NA SUSP: 2.0000 | Freq: Every day | NASAL | 6 refills | Status: DC

## 2022-06-12 NOTE — Progress Notes (Signed)
Name: Jeffery [name]   MRN: [id number]    DOB: [birth year]   Date:06/12/2022       Progress Note  Subjective:    Chief Complaint  Chief Complaint  Patient presents with   Nasal Congestion   Headache    Pressure around eyes, onset for a week    I connected with  Jeffery [name]  on 06/12/22 at 10:20 AM EDT by a video enabled telemedicine application and verified that I am speaking with the correct person using two identifiers.  I discussed the limitations of evaluation and management by telemedicine and the availability of in person appointments. The patient expressed understanding and agreed to proceed. Staff also discussed with the patient that there may be a patient responsible charge related to this service. Patient Location: work Advertising account planner Location:  [facility] clinic office Additional Individuals present: none  Headache    Sinus pressure and pain, feeling "yucky", feels like he has sinus infection He is using Zyrtec and tylenol sinus Pressure in cheeks and eyes, no fever Also generalized fatigue that is new Sx persistent w/o improvement and some worsening for over a week He's done multiple home covid tests that were negative  Patient Active Problem List   Diagnosis Date Noted   Prediabetes 01/29/2022   Personal history of colonic polyps 01/25/2022   Adenomatous polyp of colon 01/25/2022   Hypertriglyceridemia 09/06/2020   Family history of colon cancer in mother 08/12/2019   Obstructive sleep apnea syndrome 08/12/2019   Current smoker 08/12/2019   Insulin resistance 08/17/2018   Metabolic syndrome 08/17/2018   Gastroesophageal reflux disease without esophagitis 03/17/2018   Other chest pain 03/16/2018   Mixed hyperlipidemia 03/16/2018   Essential hypertension 12/22/2017   Hypogonadism male 09/19/2016   Family history of type 2 diabetes mellitus in mother 04/05/2016   Benign prostatic hyperplasia with urinary obstruction 03/10/2015   Dyslipidemia 03/10/2015    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   Opioid type dependence, abuse 03/10/2015   Class 1 obesity with serious comorbidity and body mass index (BMI) of 34.0 to 34.9 in adult 03/10/2015   History of nasal septoplasty 11/23/2014   Chronic prostatitis 10/21/2014   Umbilical hernia 08/06/2013    Social History   Tobacco Use   Smoking status: Former    Packs/day: 1.00    Years: 30.00    Additional pack years: 0.00    Total pack years: 30.00    Types: Cigarettes, E-cigarettes   Smokeless tobacco: Current  Substance Use Topics   Alcohol use: No    Alcohol/week: 0.0 standard drinks of alcohol     Current Outpatient Medications:    atorvastatin (LIPITOR) 40 MG tablet, TAKE 1 TABLET(40 MG) BY MOUTH DAILY, Disp: 90 tablet, Rfl: 3   benzonatate (TESSALON) 100 MG capsule, Take 2 capsules (200 mg total) by mouth 2 (two) times daily as needed for cough., Disp: 20 capsule, Rfl: 0   lisinopril (ZESTRIL) 10 MG tablet, Take one tablet by mouth daily, Disp: 90 tablet, Rfl: 3   metFORMIN (GLUCOPHAGE) 500 MG tablet, TAKE 2 TABLETS(1000 MG) BY MOUTH TWICE DAILY WITH A MEAL, Disp: 180 tablet, Rfl: 0   omeprazole (PRILOSEC) 20 MG capsule, Take 1 capsule (20 mg total) by mouth daily., Disp: 90 capsule, Rfl: 3   Semaglutide-Weight Management 1 MG/0.5ML SOAJ, Inject 1 mg into the skin once a week., Disp: 6 mL, Rfl: 3   sildenafil (VIAGRA) 100 MG tablet, Take 1 tablet (100 mg total) by mouth daily as  needed for erectile dysfunction. Take two hours prior to intercourse on an empty stomach, Disp: 30 tablet, Rfl: 3   tadalafil (CIALIS) 20 MG tablet, Take 1 tablet (20 mg total) by mouth daily as needed for erectile dysfunction., Disp: 30 tablet, Rfl: 3   testosterone (ANDROGEL) 50 MG/5GM (1%) GEL, APPLY THE CONTENTS OF 2 PACKETS TO CLEAN, DRY AND INTACT SKIN DAILY( AVOID FACE AND SCROTUM), Disp: 300 g, Rfl: 3  Allergies  Allergen Reactions   Sulfa Antibiotics Nausea Only   Doxycycline Other (See Comments) and  Nausea And Vomiting    I personally reviewed active problem list, medication list, allergies, family history, social history, health maintenance, notes from last encounter, lab results, imaging with the patient/caregiver today.   Review of Systems  Constitutional: Negative.   HENT: Negative.    Eyes: Negative.   Respiratory: Negative.    Cardiovascular: Negative.   Gastrointestinal: Negative.   Endocrine: Negative.   Genitourinary: Negative.   Musculoskeletal: Negative.   Skin: Negative.   Allergic/Immunologic: Negative.   Neurological:  Positive for headaches.  Hematological: Negative.   Psychiatric/Behavioral: Negative.    All other systems reviewed and are negative.     Objective:   Virtual encounter, vitals limited, only able to obtain the following There were no vitals filed for this visit. There is no height or weight on file to calculate BMI. Nursing Note and Vital Signs reviewed.  Physical Exam Vitals and nursing note reviewed.  Constitutional:      General: He is not in acute distress.    Appearance: He is well-developed. He is not ill-appearing, toxic-appearing or diaphoretic.  Neurological:     Mental Status: He is alert.     PE limited by virtual encounter  No results found for this or any previous visit (from the past 72 hour(s)).  Assessment and Plan:     ICD-10-CM   1. Rhinosinusitis  J32.9 amoxicillin-clavulanate (AUGMENTIN) 875-125 MG tablet    fluticasone (FLONASE) 50 MCG/ACT nasal spray    levocetirizine (XYZAL) 5 MG tablet   suspect underlying allergy component - try increasing tx for allergies and if no improvement he will start abx    2. Fatigue, unspecified type  R53.83    URI/allergy/sinus sx for over a week with recent increased fatigue/exhaustion - feels acutely ill, covid tests at home neg x 2-3    3. History of sinus surgery  Z98.890    previously at [facility] - if recurrent infections or sx may need to f/up with ent    4.  Seasonal allergies  J30.2    recommend adjusting meds for sx and as needed for different seasons, for now double antihistamine trial and add flonase     Last abx for sinus infection was August 2023 URI 2 months ago managed w/o antibiotics He believes he has seasonal allergies, currently not controlled with zyrtec alone Encouraged to double up on antihistamines when needed Can add sudafed prn if tolerated  Use of intranasal steroid sprays regularly can improve sx And saline sprays can help when having acute sx  He had prior ENT/sinus surgery - we did discuss at length need for f/up if sx overall are not well controlled, and need for further eval if he starts to have sinus infections requiring abx over and over - he would likely need ENT consult again with his past hx   - I discussed the assessment and treatment plan with the patient. The patient was provided an opportunity to ask questions  and all were answered. The patient agreed with the plan and demonstrated an understanding of the instructions.  I provided 20+ minutes of non-face-to-face time during this encounter.  Danelle Berry, PA-C 06/12/22 11:00 AM

## 2022-06-13 ENCOUNTER — Other Ambulatory Visit: Payer: Self-pay | Admitting: Family Medicine

## 2022-06-13 DIAGNOSIS — E781 Pure hyperglyceridemia: Secondary | ICD-10-CM

## 2022-06-13 DIAGNOSIS — E88819 Insulin resistance, unspecified: Secondary | ICD-10-CM

## 2022-06-13 DIAGNOSIS — R7303 Prediabetes: Secondary | ICD-10-CM

## 2022-06-13 DIAGNOSIS — E8881 Metabolic syndrome: Secondary | ICD-10-CM

## 2022-06-27 ENCOUNTER — Other Ambulatory Visit: Payer: Self-pay | Admitting: Physician Assistant

## 2022-07-08 ENCOUNTER — Other Ambulatory Visit: Payer: Self-pay

## 2022-07-08 DIAGNOSIS — N529 Male erectile dysfunction, unspecified: Secondary | ICD-10-CM

## 2022-07-08 DIAGNOSIS — E349 Endocrine disorder, unspecified: Secondary | ICD-10-CM

## 2022-07-08 NOTE — Patient Instructions (Signed)

## 2022-07-09 ENCOUNTER — Other Ambulatory Visit: Payer: BC Managed Care – PPO

## 2022-07-09 ENCOUNTER — Ambulatory Visit (INDEPENDENT_AMBULATORY_CARE_PROVIDER_SITE_OTHER): Payer: BC Managed Care – PPO | Admitting: Family Medicine

## 2022-07-09 ENCOUNTER — Encounter: Payer: Self-pay | Admitting: Family Medicine

## 2022-07-09 VITALS — BP 122/84 | HR 93 | Temp 97.8°F | Resp 16 | Ht 70.5 in | Wt 253.2 lb

## 2022-07-09 DIAGNOSIS — Z6835 Body mass index (BMI) 35.0-35.9, adult: Secondary | ICD-10-CM

## 2022-07-09 DIAGNOSIS — K219 Gastro-esophageal reflux disease without esophagitis: Secondary | ICD-10-CM

## 2022-07-09 DIAGNOSIS — E66812 Obesity, class 2: Secondary | ICD-10-CM

## 2022-07-09 DIAGNOSIS — F17200 Nicotine dependence, unspecified, uncomplicated: Secondary | ICD-10-CM | POA: Diagnosis not present

## 2022-07-09 DIAGNOSIS — Z800 Family history of malignant neoplasm of digestive organs: Secondary | ICD-10-CM

## 2022-07-09 DIAGNOSIS — E781 Pure hyperglyceridemia: Secondary | ICD-10-CM

## 2022-07-09 DIAGNOSIS — E88819 Insulin resistance, unspecified: Secondary | ICD-10-CM

## 2022-07-09 DIAGNOSIS — Z0000 Encounter for general adult medical examination without abnormal findings: Secondary | ICD-10-CM

## 2022-07-09 DIAGNOSIS — E291 Testicular hypofunction: Secondary | ICD-10-CM

## 2022-07-09 DIAGNOSIS — I10 Essential (primary) hypertension: Secondary | ICD-10-CM

## 2022-07-09 DIAGNOSIS — G4733 Obstructive sleep apnea (adult) (pediatric): Secondary | ICD-10-CM

## 2022-07-09 DIAGNOSIS — E782 Mixed hyperlipidemia: Secondary | ICD-10-CM

## 2022-07-09 DIAGNOSIS — R7303 Prediabetes: Secondary | ICD-10-CM

## 2022-07-09 NOTE — Progress Notes (Signed)
Patient: Jeffery [name], Male    DOB: 06-19-1970, 52 y.o.   MRN: [id number] Danelle Berry, PA-C Visit Date: 07/09/2022  Today's Provider: Danelle Berry, PA-C   Chief Complaint  Patient presents with   Annual Exam   Subjective:   Annual physical exam:  Jeffery [name] is a 52 y.o. male who presents today for health maintenance and annual & complete physical exam.   Exercise/Activity:  working out at the gym again and trying to do some cardio or walk more Diet/nutrition:  no specific efforts but he wants to loose weight  Sleep:  sleeps well using CPAP  Pt managed ED/low T with urology, managed HLD, HTN, GERD with PCP, previously was able to get ozempic for insulin resistance, OSA on CPAP Obesity - gained weight when off ozempic He has family hx of CRC cancer just did his f/up colonoscopy and  SDOH Screenings   Food Insecurity: No Food Insecurity (07/09/2022)  Housing: Low Risk  (07/09/2022)  Transportation Needs: No Transportation Needs (07/09/2022)  Utilities: Not At Risk (07/09/2022)  Alcohol Screen: Low Risk  (07/09/2022)  Depression (PHQ2-9): Low Risk  (07/09/2022)  Financial Resource Strain: Low Risk  (07/09/2022)  Physical Activity: Sufficiently Active (07/09/2022)  Social Connections: Moderately Integrated (07/09/2022)  Stress: No Stress Concern Present (07/09/2022)  Tobacco Use: High Risk (07/09/2022)     USPSTF grade A and B recommendations - reviewed and addressed today  Depression:  Phq 9 completed today by patient, was reviewed by me with patient in the room, score is  negative, pt feels good - recent fatigue    07/09/2022    9:04 AM 06/12/2022   10:18 AM 04/17/2022    2:05 PM  Depression screen PHQ 2/9  Decreased Interest 0 0 0  Down, Depressed, Hopeless 0 0 0  PHQ - 2 Score 0 0 0  Altered sleeping 0 0   Tired, decreased energy 0 0   Change in appetite 0 0   Feeling bad or failure about yourself  0 0   Trouble concentrating 0 0   Moving slowly or  fidgety/restless 0 0   Suicidal thoughts 0 0   PHQ-9 Score 0 0   Difficult doing work/chores Not difficult at all Not difficult at all     Hep C Screening: done  STD testing and prevention (HIV/chl/gon/syphilis): done previously   Intimate partner violence:  safe at home  Advanced Care Planning:  A voluntary discussion about advance care planning including the explanation and discussion of advance directives.  Discussed health care proxy and Living will, and the patient was able to identify a health care proxy as Lenard Galloway Alona Bene) Christell Constant.  Patient does not have a living will at present time. If patient does have living will, I have requested they bring this to the clinic to be scanned in to their chart.  Health Maintenance  Topic Date Due   COVID-19 Vaccine (4 - 2023-24 season) 07/25/2022 (Originally 10/26/2021)   INFLUENZA VACCINE  09/26/2022   DTaP/Tdap/Td (3 - Td or Tdap) 12/19/2026   COLONOSCOPY (Pts 45-7yrs Insurance coverage will need to be confirmed)  01/26/2027   Hepatitis C Screening  Completed   HIV Screening  Completed   HPV VACCINES  Aged Out    Skin cancer:   last skin survey was.  Pt reports no hx of skin cancer, suspicious lesions/biopsies in the past.  Colorectal cancer:  colonoscopy is UTD - reviewed - 3 year f/up     Prostate cancer:  per urology  Urinary Symptoms:   no concerns - on meds per urology  Lung cancer:   Low Dose CT Chest recommended if Age 47-80 years, 20 pack-year currently smoking OR have quit w/in 15years. Patient does not qualify.   Social History   Tobacco Use   Smoking status: Former    Types: E-cigarettes   Smokeless tobacco: Current  Substance Use Topics   Alcohol use: No     Alcohol screening: Flowsheet Row Office Visit from 07/09/2022 in [facility]  AUDIT-C Score 0       AAA:  The USPSTF recommends one-time screening with ultrasonography in men ages 77 to 62 years who have ever smoked  ECG:not  indicated today  Blood pressure/Hypertension: BP Readings from Last 3 Encounters:  07/09/22 122/84  04/17/22 128/72  01/25/22 (!) 129/95   Weight/Obesity: Wt Readings from Last 3 Encounters:  07/09/22 253 lb 3.2 oz (114.9 kg)  04/17/22 247 lb 9.6 oz (112.3 kg)  01/25/22 243 lb (110.2 kg)   BMI Readings from Last 3 Encounters:  07/09/22 35.82 kg/m  04/17/22 34.53 kg/m  01/25/22 33.89 kg/m    Lipids:  Lab Results  Component Value Date   CHOL 225 (H) 01/04/2022   CHOL 238 (H) 09/04/2020   CHOL 200 (H) 08/13/2019   Lab Results  Component Value Date   HDL 34 (L) 01/04/2022   HDL 28 (L) 09/04/2020   HDL 32 (L) 08/13/2019   Lab Results  Component Value Date   LDLCALC 131 (H) 01/04/2022   LDLCALC  09/04/2020     Comment:     . LDL cholesterol not calculated. Triglyceride levels greater than 400 mg/dL invalidate calculated LDL results. . Reference range: <100 . Desirable range <100 mg/dL for primary prevention;   <70 mg/dL for patients with CHD or diabetic patients  with > or = 2 CHD risk factors. Marland Kitchen LDL-C is now calculated using the Martin-Hopkins  calculation, which is a validated novel method providing  better accuracy than the Friedewald equation in the  estimation of LDL-C.  Horald Pollen et al. Lenox Ahr. 1610;960(45): 2061-2068  (http://education.[facility].com/faq/FAQ164)    LDLCALC 133 (H) 08/13/2019   Lab Results  Component Value Date   TRIG 393 (H) 01/04/2022   TRIG 1,161 (H) 09/04/2020   TRIG 212 (H) 08/13/2019   Lab Results  Component Value Date   CHOLHDL 6.6 (H) 01/04/2022   CHOLHDL 8.5 (H) 09/04/2020   CHOLHDL 6.3 (H) 08/13/2019   No results found for: "LDLDIRECT" Based on the results of lipid panel his/her cardiovascular risk factor ( using [facility] )  in the next 10 years is : The 10-year ASCVD risk score (Arnett DK, et al., 2019) is: 13.6%   Values used to calculate the score:     Age: 19 years     Sex: Male     Is Non-Hispanic  African American: No     Diabetic: No     Tobacco smoker: Yes     Systolic Blood Pressure: 122 mmHg     Is BP treated: Yes     HDL Cholesterol: 34 mg/dL     Total Cholesterol: 225 mg/dL Glucose:  Glucose, Bld  Date Value Ref Range Status  04/17/2022 84 65 - 99 mg/dL Final    Comment:    .            Fasting reference interval .   01/04/2022 96 65 - 99 mg/dL Final    Comment:    .  Fasting reference interval .   09/25/2021 86 65 - 99 mg/dL Final    Comment:    .            Fasting reference interval .     Social History       Social History   Socioeconomic History   Marital status: Married    Spouse name: [name]   Number of children: 3   Years of education: Not on file   Highest education level: High school graduate  Occupational History   Occupation: IT sales professional  Tobacco Use   Smoking status: Former    Types: E-cigarettes   Smokeless tobacco: Current  Vaping Use   Vaping Use: Every day  Substance and Sexual Activity   Alcohol use: No   Drug use: No   Sexual activity: Yes    Partners: Female  Other Topics Concern   Not on file  Social History Narrative   Not on file   Social Determinants of Health   Financial Resource Strain: Low Risk  (07/09/2022)   Overall Financial Resource Strain (CARDIA)    Difficulty of Paying Living Expenses: Not hard at all  Food Insecurity: No Food Insecurity (07/09/2022)   Hunger Vital Sign    Worried About Running Out of Food in the Last Year: Never true    Ran Out of Food in the Last Year: Never true  Transportation Needs: No Transportation Needs (07/09/2022)   PRAPARE - Administrator, Civil Service (Medical): No    Lack of Transportation (Non-Medical): No  Physical Activity: Sufficiently Active (07/09/2022)   Exercise Vital Sign    Days of Exercise per Week: 3 days    Minutes of Exercise per Session: 50 min  Stress: No Stress Concern Present (07/09/2022)   [employer] of Occupational  Health - Occupational Stress Questionnaire    Feeling of Stress : Only a little  Social Connections: Moderately Integrated (07/09/2022)   Social Connection and Isolation Panel [NHANES]    Frequency of Communication with Friends and Family: More than three times a week    Frequency of Social Gatherings with Friends and Family: More than three times a week    Attends Religious Services: More than 4 times per year    Active Member of [employer] or Organizations: No    Attends Engineer, structural: Never    Marital Status: Married    Family History        Family History  Problem Relation Age of Onset   Diabetes Mother    Cancer Mother        Colon Cancer   Hypertension Father    Cancer Maternal Grandmother        Lung-  Non-Smoker    Patient Active Problem List   Diagnosis Date Noted   Prediabetes 01/29/2022   Personal history of colonic polyps 01/25/2022   Adenomatous polyp of colon 01/25/2022   Hypertriglyceridemia 09/06/2020   Family history of colon cancer in mother 08/12/2019   Obstructive sleep apnea syndrome 08/12/2019   Current smoker 08/12/2019   Insulin resistance 08/17/2018   Metabolic syndrome 08/17/2018   Gastroesophageal reflux disease without esophagitis 03/17/2018   Other chest pain 03/16/2018   Mixed hyperlipidemia 03/16/2018   Essential hypertension 12/22/2017   Hypogonadism male 09/19/2016   Family history of type 2 diabetes mellitus in mother 04/05/2016   Benign prostatic hyperplasia with urinary obstruction 03/10/2015   Dyslipidemia 03/10/2015   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   Opioid  type dependence, abuse (HCC) 03/10/2015   Class 1 obesity with serious comorbidity and body mass index (BMI) of 34.0 to 34.9 in adult 03/10/2015   History of nasal septoplasty 11/23/2014   Chronic prostatitis 10/21/2014   Umbilical hernia 08/06/2013    Past Surgical History:  Procedure Laterality Date   COLONOSCOPY WITH PROPOFOL N/A 01/25/2022    Procedure: COLONOSCOPY WITH PROPOFOL;  Surgeon: Wyline Mood, MD;  Location: [facility];  Service: Gastroenterology;  Laterality: N/A;   FRACTURE SURGERY  06/1997   HERNIA REPAIR  03/11/2014   umbilical hernia   NASAL SEPTOPLASTY W/ TURBINOPLASTY Bilateral 11/23/2014   Procedure: NASAL SEPTOPLASTY WITH TURBINATE REDUCTION;  Surgeon: Bud Face, MD;  Location[facility];  Service: ENT;  Laterality: Bilateral;   PROSTATE BIOPSY  2015   ROOT CANAL     WRIST FRACTURE SURGERY Right      Current Outpatient Medications:    atorvastatin (LIPITOR) 40 MG tablet, TAKE 1 TABLET(40 MG) BY MOUTH DAILY, Disp: 90 tablet, Rfl: 3   lisinopril (ZESTRIL) 10 MG tablet, Take one tablet by mouth daily, Disp: 90 tablet, Rfl: 3   metFORMIN (GLUCOPHAGE) 500 MG tablet, TAKE 2 TABLETS(1000 MG) BY MOUTH TWICE DAILY WITH A MEAL, Disp: 180 tablet, Rfl: 0   omeprazole (PRILOSEC) 20 MG capsule, Take 1 capsule (20 mg total) by mouth daily., Disp: 90 capsule, Rfl: 3   sildenafil (VIAGRA) 100 MG tablet, Take 1 tablet (100 mg total) by mouth daily as needed for erectile dysfunction. Take two hours prior to intercourse on an empty stomach, Disp: 30 tablet, Rfl: 3   tadalafil (CIALIS) 20 MG tablet, Take 1 tablet (20 mg total) by mouth daily as needed for erectile dysfunction., Disp: 30 tablet, Rfl: 3   testosterone (ANDROGEL) 50 MG/5GM (1%) GEL, APPLY THE CONTENTS OF 2 PACKETS TO CLEAN, DRY AND INTACT SKIN DAILY( AVOID FACE AND SCROTUM), Disp: 300 g, Rfl: 3   benzonatate (TESSALON) 100 MG capsule, Take 2 capsules (200 mg total) by mouth 2 (two) times daily as needed for cough. (Patient not taking: Reported on 07/09/2022), Disp: 20 capsule, Rfl: 0   fluticasone (FLONASE) 50 MCG/ACT nasal spray, Place 2 sprays into both nostrils daily. (Patient not taking: Reported on 07/09/2022), Disp: 16 g, Rfl: 6   levocetirizine (XYZAL) 5 MG tablet, Take 1 tablet (5 mg total) by mouth every evening. (Patient not taking: Reported  on 07/09/2022), Disp: 60 tablet, Rfl: 2   Semaglutide-Weight Management 1 MG/0.5ML SOAJ, Inject 1 mg into the skin once a week. (Patient not taking: Reported on 07/09/2022), Disp: 6 mL, Rfl: 3  Allergies  Allergen Reactions   Sulfa Antibiotics Nausea Only   Doxycycline Other (See Comments) and Nausea And Vomiting    Patient Care Team: Danelle Berry, PA-C as PCP - General (Family Medicine) Verdis Prime, [place] (Physician Assistant) Erskine Speed, PA (Physician Assistant) Sherryl Manges, MD as Referring Physician (Urology)   Chart Review: I have reviewed the patient's medical history in detail and updated the computerized patient record.   Review of Systems  Constitutional: Negative.   HENT: Negative.    Eyes: Negative.   Respiratory: Negative.    Cardiovascular: Negative.   Gastrointestinal: Negative.   Endocrine: Negative.   Genitourinary: Negative.   Musculoskeletal: Negative.   Skin: Negative.   Allergic/Immunologic: Negative.   Neurological: Negative.   Hematological: Negative.   Psychiatric/Behavioral: Negative.    All other systems reviewed and are negative.         Objective:  Vitals:  Vitals:   07/09/22 0905  BP: 122/84  Pulse: 93  Resp: 16  Temp: 97.8 F (36.6 C)  TempSrc: Oral  SpO2: 94%  Weight: 253 lb 3.2 oz (114.9 kg)  Height: 5' 10.5" (1.791 m)    Body mass index is 35.82 kg/m.  Physical Exam Vitals and nursing note reviewed.  Constitutional:      General: He is not in acute distress.    Appearance: Normal appearance. He is well-developed, well-groomed and overweight. He is not ill-appearing, toxic-appearing or diaphoretic.  HENT:     Head: Normocephalic and atraumatic.     Jaw: No trismus.     Right Ear: Tympanic membrane, ear canal and external ear normal.     Left Ear: Tympanic membrane, ear canal and external ear normal.     Nose: Nose normal. No mucosal edema.     Right Sinus: No maxillary sinus tenderness or frontal sinus  tenderness.     Left Sinus: No maxillary sinus tenderness or frontal sinus tenderness.     Mouth/Throat:     Pharynx: Uvula midline. No uvula swelling.  Eyes:     General: Lids are normal. No scleral icterus.       Right eye: No discharge.        Left eye: No discharge.     Conjunctiva/sclera: Conjunctivae normal.  Neck:     Trachea: Trachea and phonation normal. No tracheal deviation.  Cardiovascular:     Rate and Rhythm: Normal rate and regular rhythm.     Pulses: Normal pulses.          Radial pulses are 2+ on the right side and 2+ on the left side.       Posterior tibial pulses are 2+ on the right side and 2+ on the left side.     Heart sounds: Normal heart sounds. No murmur heard.    No friction rub. No gallop.  Pulmonary:     Effort: Pulmonary effort is normal. No respiratory distress.     Breath sounds: Normal breath sounds. No stridor. No wheezing, rhonchi or rales.  Abdominal:     General: Bowel sounds are normal. There is no distension.     Palpations: Abdomen is soft.     Tenderness: There is no abdominal tenderness. There is no guarding or rebound.  Musculoskeletal:     Cervical back: Normal range of motion and neck supple.     Right lower leg: No edema.     Left lower leg: No edema.  Skin:    General: Skin is warm and dry.     Capillary Refill: Capillary refill takes less than 2 seconds.     Findings: No rash.  Neurological:     Mental Status: He is alert. Mental status is at baseline.     Gait: Gait normal.  Psychiatric:        Mood and Affect: Mood normal.        Speech: Speech normal.        Behavior: Behavior normal. Behavior is cooperative.      Recent Results (from the past 2160 hour(s))  Novel Coronavirus, NAA ([facility])     Status: None   Collection Time: 04/17/22 12:00 AM   Specimen: Nasopharyngeal(NP) swabs in vial transport medium   Nasopharynge  Previous  Result Value Ref Range   SARS-CoV-2, NAA Not Detected Not Detected    Comment: This  nucleic acid amplification test was developed and its performance characteristics determined by [facility]. Nucleic acid amplification tests include RT-PCR and TMA. This test has not been FDA cleared or approved. This test has been authorized by FDA under an Emergency Use Authorization (EUA). This test is only authorized for the duration of time the declaration that circumstances exist justifying the authorization of the emergency use of in vitro diagnostic tests for detection of SARS-CoV-2 virus and/or diagnosis of COVID-19 infection under section 564(b)(1) of the Act, 21 U.S.C. 161WRU-0(A) (1), unless the authorization is terminated or revoked sooner. When diagnostic testing is negative, the possibility of a false negative result should be considered in the context of a patient's recent exposures and the presence of clinical signs and symptoms consistent with COVID-19. An individual without symptoms of COVID-19 and who is not shedding SARS-CoV-2 virus wo uld expect to have a negative (not detected) result in this assay.   POCT Influenza A/B     Status: None   Collection Time: 04/17/22  2:30 PM  Result Value Ref Range   Influenza A, POC Negative Negative   Influenza B, POC Negative Negative  CBC with Differential/Platelet     Status: Abnormal   Collection Time: 04/17/22  2:36 PM  Result Value Ref Range   WBC 8.6 3.8 - 10.8 Thousand/uL   RBC 5.03 4.20 - 5.80 Million/uL   Hemoglobin 14.9 13.2 - 17.1 g/dL   HCT 54.0 98.1 - 19.1 %   MCV 87.3 80.0 - 100.0 fL   MCH 29.6 27.0 - 33.0 pg   MCHC 33.9 32.0 - 36.0 g/dL   RDW 47.8 29.5 - 62.1 %   Platelets 255 140 - 400 Thousand/uL   MPV 10.4 7.5 - 12.5 fL   Neutro Abs 4,395 1,500 - 7,800 cells/uL   Lymphs Abs 3,062 850 - 3,900 cells/uL   Absolute Monocytes 963 (H) 200 - 950 cells/uL   Eosinophils Absolute 129 15 - 500 cells/uL   Basophils Absolute 52 0 - 200 cells/uL   Neutrophils Relative % 51.1 %   Total Lymphocyte 35.6 %    Monocytes Relative 11.2 %   Eosinophils Relative 1.5 %   Basophils Relative 0.6 %  COMPLETE METABOLIC PANEL WITH GFR     Status: None   Collection Time: 04/17/22  2:36 PM  Result Value Ref Range   Glucose, Bld 84 65 - 99 mg/dL    Comment: .            Fasting reference interval .    BUN 21 7 - 25 mg/dL   Creat 3.08 6.57 - 8.46 mg/dL   eGFR 77 > OR = 60 NG/EXB/2.84X3   BUN/Creatinine Ratio SEE NOTE: 6 - 22 (calc)    Comment:    Not Reported: BUN and Creatinine are within    reference range. .    Sodium 140 135 - 146 mmol/L   Potassium 4.2 3.5 - 5.3 mmol/L   Chloride 104 98 - 110 mmol/L   CO2 27 20 - 32 mmol/L   Calcium 8.8 8.6 - 10.3 mg/dL   Total Protein 6.6 6.1 - 8.1 g/dL   Albumin 4.0 3.6 - 5.1 g/dL   Globulin 2.6 1.9 - 3.7 g/dL (calc)   AG Ratio 1.5 1.0 - 2.5 (calc)   Total Bilirubin 0.3 0.2 - 1.2 mg/dL   Alkaline phosphatase (APISO) 56 36 - 130 U/L   AST 27 10 - 40 U/L   ALT 20 9 - 46 U/L  PSA     Status: None   Collection Time: 06/06/22  3:23 PM  Result Value Ref Range   Prostate Specific Ag, Serum 0.7 0.0 - 4.0 ng/mL    Comment: Roche ECLIA methodology. According to the American Urological Association, Serum PSA should decrease and remain at undetectable levels after radical prostatectomy. The AUA defines biochemical recurrence as an initial PSA value 0.2 ng/mL or greater followed by a subsequent confirmatory PSA value 0.2 ng/mL or greater. Values obtained with different assay methods or kits cannot be used interchangeably. Results cannot be interpreted as absolute evidence of the presence or absence of malignant disease.   Testosterone     Status: Abnormal   Collection Time: 06/06/22  3:23 PM  Result Value Ref Range   Testosterone 162 (L) 264 - 916 ng/dL    Comment: Adult male reference interval is based on a population of healthy nonobese males (BMI <30) between 61 and 60 years old. Travison, et.al. JCEM [phone]. PMID: [id number].   Hemoglobin  and hematocrit, blood     Status: None   Collection Time: 06/06/22  3:23 PM  Result Value Ref Range   Hemoglobin 14.8 13.0 - 17.7 g/dL   Hematocrit 62.1 30.8 - 51.0 %    Fall Risk:    07/09/2022    9:04 AM 06/12/2022   10:18 AM 04/17/2022    2:04 PM 01/04/2022    8:47 AM 09/25/2021    3:34 PM  Fall Risk   Falls in the past year? 0 0 0 0 0  Number falls in past yr: 0 0 0 0 0  Injury with Fall? 0 0 0 0 0  Risk for fall due to : No Fall Risks No Fall Risks  No Fall Risks No Fall Risks  Follow up Falls prevention discussed;Education provided;Falls evaluation completed Falls prevention discussed;Education provided;Falls evaluation completed  Falls prevention discussed;Education provided;Falls evaluation completed Falls prevention discussed;Education provided    Functional Status Survey: Is the patient deaf or have difficulty hearing?: No Does the patient have difficulty seeing, even when wearing glasses/contacts?: No Does the patient have difficulty concentrating, remembering, or making decisions?: No Does the patient have difficulty walking or climbing stairs?: No Does the patient have difficulty dressing or bathing?: No Does the patient have difficulty doing errands alone such as visiting a doctor's office or shopping?: No   Assessment & Plan:    CPE completed today  Prostate cancer screening and PSA options (with potential risks and benefits of testing vs not testing) were discussed along with recent recs/guidelines, shared decision making and handout/information given to pt today  USPSTF grade A and B recommendations reviewed with patient; age-appropriate recommendations, preventive care, screening tests, etc discussed and encouraged; healthy living encouraged; see AVS for patient education given to patient  Discussed importance of 150 minutes of physical activity weekly, AHA exercise recommendations given to pt in AVS/handout  Discussed importance of healthy diet:  eating lean  meats and proteins, avoiding trans fats and saturated fats, avoid simple sugars and excessive carbs in diet, eat 6 servings of fruit/vegetables daily and drink plenty of water and avoid sweet beverages.  DASH diet reviewed if pt has HTN  Recommended pt to do annual eye exam and routine dental exams/cleanings  Advance Care planning information and packet discussed and offered today, encouraged pt to discuss with family members/spouse/partner/friends and complete Advanced directive packet and bring copy to office   Reviewed Health Maintenance: Health Maintenance  Topic Date Due   COVID-19 Vaccine (4 - 2023-24 season) 07/25/2022 (Originally 10/26/2021)   INFLUENZA VACCINE  09/26/2022  DTaP/Tdap/Td (3 - Td or Tdap) 12/19/2026   COLONOSCOPY (Pts 45-24yrs Insurance coverage will need to be confirmed)  01/26/2027   Hepatitis C Screening  Completed   HIV Screening  Completed   HPV VACCINES  Aged Out    Immunizations: Immunization History  Administered Date(s) Administered   Influenza, Quadrivalent, Recombinant, Inj, Pf 12/06/2018   Influenza,inj,Quad PF,6+ Mos 12/18/2016, 10/10/2017   Influenza-Unspecified 10/26/2013, 09/26/2015, 01/24/2020, 12/09/2021   Moderna Sars-Covid-2 Vaccination 05/05/2019, 06/02/2019, 01/24/2020   PNEUMOCOCCAL CONJUGATE-20 09/04/2020   Tdap 03/15/2010, 12/18/2016   Vaccines:  HPV: up to at age 77 , ask insurance if age between 42-45  Shingrix: 98-64 yo and ask insurance if covered when patient above 31 yo Pneumonia: done educated and discussed with patient.      ICD-10-CM   1. Annual physical exam  Z00.00 COMPLETE METABOLIC PANEL WITH GFR    Hemoglobin A1c    VITAMIN D 25 Hydroxy (Vit-D Deficiency, Fractures)    Lipid Panel w/reflex Direct LDL    CBC with Differential/Platelet    2. Insulin resistance  E88.819 Hemoglobin A1c    3. Prediabetes  R73.03 Hemoglobin A1c    4. Mixed hyperlipidemia  E78.2 COMPLETE METABOLIC PANEL WITH GFR    Lipid Panel  w/reflex Direct LDL    5. Family history of colon cancer in mother  Z80.0     6. Current smoker  F17.200    vape with nicotine, prior cigarette 25-30 pack year hx - qualifies for lung cancer screening when he turns 52    7. Hypertriglyceridemia  E78.1 Lipid Panel w/reflex Direct LDL    8. Gastroesophageal reflux disease without esophagitis  K21.9     9. Obstructive sleep apnea syndrome  G47.33     10. Essential hypertension  I10 COMPLETE METABOLIC PANEL WITH GFR    11. Class 2 severe obesity with serious comorbidity and body mass index (BMI) of 35.0 to 35.9 in adult, unspecified obesity type (HCC)  E66.01 COMPLETE METABOLIC PANEL WITH GFR   Z68.35 Hemoglobin A1c    VITAMIN D 25 Hydroxy (Vit-D Deficiency, Fractures)    Lipid Panel w/reflex Direct LDL    12. Hypogonadism male  E29.1 COMPLETE METABOLIC PANEL WITH GFR    Testosterone   per urology - he was due for labs today, will order here to avoid multiple blood draws and results will be shared with Urology - coordinated with Janell Quiet     Return in about 6 months (around 01/09/2023) for Routine follow-up.      Danelle Berry, PA-C 07/09/22 9:15 AM  [facility]

## 2022-07-10 LAB — COMPLETE METABOLIC PANEL WITH GFR
AG Ratio: 1.7 (calc) (ref 1.0–2.5)
ALT: 15 U/L (ref 9–46)
AST: 28 U/L (ref 10–40)
Albumin: 4.6 g/dL (ref 3.6–5.1)
Alkaline phosphatase (APISO): 73 U/L (ref 36–130)
BUN: 19 mg/dL (ref 7–25)
CO2: 24 mmol/L (ref 20–32)
Calcium: 9.1 mg/dL (ref 8.6–10.3)
Chloride: 103 mmol/L (ref 98–110)
Creat: 1.18 mg/dL (ref 0.60–1.29)
Globulin: 2.7 g/dL (calc) (ref 1.9–3.7)
Glucose, Bld: 83 mg/dL (ref 65–99)
Potassium: 4.3 mmol/L (ref 3.5–5.3)
Sodium: 137 mmol/L (ref 135–146)
Total Bilirubin: 0.3 mg/dL (ref 0.2–1.2)
Total Protein: 7.3 g/dL (ref 6.1–8.1)
eGFR: 76 mL/min/{1.73_m2} (ref >=60)

## 2022-07-10 LAB — CBC WITH DIFFERENTIAL/PLATELET
Absolute Monocytes: 570 cells/uL (ref 200–950)
Basophils Absolute: 68 cells/uL (ref 0–200)
Basophils Relative: 0.9 %
Eosinophils Absolute: 53 cells/uL (ref 15–500)
Eosinophils Relative: 0.7 %
HCT: 46.5 % (ref 38.5–50.0)
Hemoglobin: 15.6 g/dL (ref 13.2–17.1)
Lymphs Abs: 2595 cells/uL (ref 850–3900)
MCH: 29.4 pg (ref 27.0–33.0)
MCHC: 33.5 g/dL (ref 32.0–36.0)
MCV: 87.7 fL (ref 80.0–100.0)
MPV: 9.6 fL (ref 7.5–12.5)
Monocytes Relative: 7.6 %
Neutro Abs: 4215 cells/uL (ref 1500–7800)
Neutrophils Relative %: 56.2 %
Platelets: 295 10*3/uL (ref 140–400)
RBC: 5.3 10*6/uL (ref 4.20–5.80)
RDW: 13.1 % (ref 11.0–15.0)
Total Lymphocyte: 34.6 %
WBC: 7.5 10*3/uL (ref 3.8–10.8)

## 2022-07-10 LAB — LIPID PANEL W/REFLEX DIRECT LDL
Cholesterol: 287 mg/dL — ABNORMAL HIGH (ref <200)
HDL: 33 mg/dL — ABNORMAL LOW (ref >=40)
Non-HDL Cholesterol (Calc): 254 mg/dL (calc) — ABNORMAL HIGH (ref <130)
Total CHOL/HDL Ratio: 8.7 (calc) — ABNORMAL HIGH (ref <5.0)
Triglycerides: 517 mg/dL — ABNORMAL HIGH (ref <150)

## 2022-07-10 LAB — HEMOGLOBIN A1C
Hgb A1c MFr Bld: 5.6 % of total Hgb (ref <5.7)
Mean Plasma Glucose: 114 mg/dL
eAG (mmol/L): 6.3 mmol/L

## 2022-07-10 LAB — VITAMIN D 25 HYDROXY (VIT D DEFICIENCY, FRACTURES): Vit D, 25-Hydroxy: 33 ng/mL (ref 30–100)

## 2022-07-10 LAB — TESTOSTERONE: Testosterone: 680 ng/dL (ref 250–827)

## 2022-07-10 LAB — DIRECT LDL: Direct LDL: 134 mg/dL — ABNORMAL HIGH (ref <100)

## 2022-07-16 ENCOUNTER — Encounter: Payer: Self-pay | Admitting: Family Medicine

## 2022-07-23 MED ORDER — ROSUVASTATIN CALCIUM 20 MG PO TABS: 20.0000 mg | ORAL_TABLET | Freq: Every day | ORAL | 1 refills | Status: DC

## 2022-07-28 ENCOUNTER — Other Ambulatory Visit: Payer: Self-pay | Admitting: Urology

## 2022-07-28 DIAGNOSIS — E349 Endocrine disorder, unspecified: Secondary | ICD-10-CM

## 2022-08-03 DIAGNOSIS — R197 Diarrhea, unspecified: Secondary | ICD-10-CM | POA: Diagnosis not present

## 2022-08-03 DIAGNOSIS — I10 Essential (primary) hypertension: Secondary | ICD-10-CM | POA: Diagnosis not present

## 2022-08-03 DIAGNOSIS — R1084 Generalized abdominal pain: Secondary | ICD-10-CM | POA: Diagnosis not present

## 2022-08-03 DIAGNOSIS — E785 Hyperlipidemia, unspecified: Secondary | ICD-10-CM | POA: Diagnosis not present

## 2022-08-03 DIAGNOSIS — F17210 Nicotine dependence, cigarettes, uncomplicated: Secondary | ICD-10-CM | POA: Diagnosis not present

## 2022-08-03 DIAGNOSIS — R109 Unspecified abdominal pain: Secondary | ICD-10-CM | POA: Diagnosis not present

## 2022-10-20 ENCOUNTER — Other Ambulatory Visit: Payer: Self-pay | Admitting: Urology

## 2022-10-20 DIAGNOSIS — N400 Enlarged prostate without lower urinary tract symptoms: Secondary | ICD-10-CM

## 2022-10-25 ENCOUNTER — Other Ambulatory Visit: Payer: Self-pay | Admitting: Urology

## 2022-10-25 DIAGNOSIS — N400 Enlarged prostate without lower urinary tract symptoms: Secondary | ICD-10-CM

## 2022-10-30 ENCOUNTER — Other Ambulatory Visit: Payer: Self-pay | Admitting: Urology

## 2022-10-30 ENCOUNTER — Encounter: Payer: Self-pay | Admitting: Family Medicine

## 2022-10-30 DIAGNOSIS — N529 Male erectile dysfunction, unspecified: Secondary | ICD-10-CM

## 2022-11-04 DIAGNOSIS — G4733 Obstructive sleep apnea (adult) (pediatric): Secondary | ICD-10-CM | POA: Diagnosis not present

## 2022-11-10 ENCOUNTER — Other Ambulatory Visit: Payer: Self-pay | Admitting: Urology

## 2022-11-10 DIAGNOSIS — N400 Enlarged prostate without lower urinary tract symptoms: Secondary | ICD-10-CM

## 2022-11-10 DIAGNOSIS — E349 Endocrine disorder, unspecified: Secondary | ICD-10-CM

## 2022-11-10 NOTE — Progress Notes (Unsigned)
11/11/2022 9:32 PM   [name] 1970/11/19 [id number]  Referring provider: Danelle Berry, PA-C [address] [place],  [place] [address]  Urological history: 1. BPH with LU TS -TUMT~ 5 years ago  -PSA pending -tadalafil 5 mg daily  2. Hypogonadism -contributing factors of age, diabetes,sleep apnea, chronic pain medications and obesity -testosterone pending -hemoglobin/hematocritpending -applying AndroGel 50mg /5g, 2 packets daily  3. Prostate nodule - PSA pending -Paternal uncle with prostate cancer at age 7 - Prior prostate biopsy with Dr. Evelene Croon around 2014 for a prostate nodule-negative   4. ED -contributing factors of age, testosterone deficiency, diabetes, sleep apnea, obesity, HTN, BPH and smoking - tadalafil 20 mg on demand dosing   No chief complaint on file.   HPI: RAKEEN GINGER is a 52 y.o. male who presents today for follow up.   Previous records reviewed.    I PSS ***       Score:  1-7 Mild 8-19 Moderate 20-35 Severe  SHIM ***    Score: 1-7 Severe ED 8-11 Moderate ED 12-16 Mild-Moderate ED 17-21 Mild ED 22-25 No ED   PMH: Past Medical History:  Diagnosis Date   Biceps tendon rupture, right, subsequent encounter 03/20/2017   Dental crowns present    sides and back   Dyslipidemia    Enlarged prostate    Epididymitis    Headache    sinus   Hypotestosteronism    Lower urinary tract symptoms (LUTS) 06/14/2016   Nodular prostate with lower urinary tract symptoms    Obesity    Sleep apnea 07/2013   Testosterone deficiency    Umbilical hernia    Umbilical hernia without obstruction or gangrene     Surgical History: Past Surgical History:  Procedure Laterality Date   COLONOSCOPY WITH PROPOFOL N/A 01/25/2022   Procedure: COLONOSCOPY WITH PROPOFOL;  Surgeon: Wyline Mood, MD;  Location: [facility] ENDOSCOPY;  Service: Gastroenterology;  Laterality: N/A;   FRACTURE SURGERY  06/1997   HERNIA REPAIR  03/11/2014    umbilical hernia   NASAL SEPTOPLASTY W/ TURBINOPLASTY Bilateral 11/23/2014   Procedure: NASAL SEPTOPLASTY WITH TURBINATE REDUCTION;  Surgeon: Bud Face, MD;  Location: [facility];  Service: ENT;  Laterality: Bilateral;   PROSTATE BIOPSY  2015   ROOT CANAL     WRIST FRACTURE SURGERY Right     Home Medications:  Allergies as of 11/11/2022       Reactions   Sulfa Antibiotics Nausea Only   Doxycycline Other (See Comments), Nausea And Vomiting        Medication List        Accurate as of November 10, 2022  9:32 PM. If you have any questions, ask your nurse or doctor.          benzonatate 100 MG capsule Commonly known as: TESSALON Take 2 capsules (200 mg total) by mouth 2 (two) times daily as needed for cough.   fluticasone 50 MCG/ACT nasal spray Commonly known as: FLONASE Place 2 sprays into both nostrils daily.   levocetirizine 5 MG tablet Commonly known as: XYZAL Take 1 tablet (5 mg total) by mouth every evening.   lisinopril 10 MG tablet Commonly known as: ZESTRIL Take one tablet by mouth daily   metFORMIN 500 MG tablet Commonly known as: GLUCOPHAGE TAKE 2 TABLETS(1000 MG) BY MOUTH TWICE DAILY WITH A MEAL   omeprazole 20 MG capsule Commonly known as: PRILOSEC Take 1 capsule (20 mg total) by mouth daily.   rosuvastatin 20 MG tablet  Commonly known as: Crestor Take 1 tablet (20 mg total) by mouth daily.   sildenafil 100 MG tablet Commonly known as: VIAGRA Take 1 tablet (100 mg total) by mouth daily as needed for erectile dysfunction. Take two hours prior to intercourse on an empty stomach   tadalafil 20 MG tablet Commonly known as: CIALIS Take 1 tablet (20 mg total) by mouth daily as needed for erectile dysfunction.   testosterone 50 MG/5GM (1%) Gel Commonly known as: ANDROGEL APPLY CONTENTS OF 2 PACKETS TO CLEAN, DRY AND INTACT SKIN DAILY(AVOID FACE AND SCROTUM)        Allergies:  Allergies  Allergen Reactions   Sulfa Antibiotics  Nausea Only   Doxycycline Other (See Comments) and Nausea And Vomiting    Family History: Family History  Problem Relation Age of Onset   Diabetes Mother    Cancer Mother        Colon Cancer   Hypertension Father    Cancer Maternal Grandmother        Lung-  Non-Smoker    Social History:  reports that he has quit smoking. His smoking use included e-cigarettes and cigarettes. He uses smokeless tobacco. He reports that he does not drink alcohol and does not use drugs.  ROS: Pertinent ROS in HPI  Physical Exam: There were no vitals taken for this visit.  Constitutional:  Well nourished. Alert and oriented, No acute distress. HEENT: [place] AT, moist mucus membranes.  Trachea midline, no masses. Cardiovascular: No clubbing, cyanosis, or edema. Respiratory: Normal respiratory effort, no increased work of breathing. GI: Abdomen is soft, non tender, non distended, no abdominal masses. Liver and spleen not palpable.  No hernias appreciated.  Stool sample for occult testing is not indicated.   GU: No CVA tenderness.  No bladder fullness or masses.  Patient with circumcised/uncircumcised phallus. ***Foreskin easily retracted***  Urethral meatus is patent.  No penile discharge. No penile lesions or rashes. Scrotum without lesions, cysts, rashes and/or edema.  Testicles are located scrotally bilaterally. No masses are appreciated in the testicles. Left and right epididymis are normal. Rectal: Patient with  normal sphincter tone. Anus and perineum without scarring or rashes. No rectal masses are appreciated. Prostate is approximately *** grams, *** nodules are appreciated. Seminal vesicles are normal. Skin: No rashes, bruises or suspicious lesions. Lymph: No cervical or inguinal adenopathy. Neurologic: Grossly intact, no focal deficits, moving all 4 extremities. Psychiatric: Normal mood and affect.  Laboratory Data:    Latest Ref Rng & Units 07/09/2022   10:00 AM 06/06/2022    3:23 PM 04/17/2022     2:36 PM  CBC  WBC 3.8 - 10.8 Thousand/uL 7.5   8.6   Hemoglobin 13.2 - 17.1 g/dL 16.1  09.6  04.5   Hematocrit 38.5 - 50.0 % 46.5  43.4  43.9   Platelets 140 - 400 Thousand/uL 295   255        Latest Ref Rng & Units 07/09/2022   10:00 AM 04/17/2022    2:36 PM 01/04/2022    9:40 AM  CMP  Glucose 65 - 99 mg/dL 83  84  96   BUN 7 - 25 mg/dL 19  21  20    Creatinine 0.60 - 1.29 mg/dL 4.09  8.11  9.14   Sodium 135 - 146 mmol/L 137  140  137   Potassium 3.5 - 5.3 mmol/L 4.3  4.2  4.4   Chloride 98 - 110 mmol/L 103  104  104   CO2 20 -  32 mmol/L 24  27  26    Calcium 8.6 - 10.3 mg/dL 9.1  8.8  9.7   Total Protein 6.1 - 8.1 g/dL 7.3  6.6  7.3   Total Bilirubin 0.2 - 1.2 mg/dL 0.3  0.3  0.3   AST 10 - 40 U/L 28  27  21    ALT 9 - 46 U/L 15  20  12    I have reviewed the labs.   Pertinent Imaging: No recent imaging  Assessment & Plan:    1. Testosterone deficiency -Testosterone pending -Hemoglobin/hematocritpending -Continue AndroGel 1% gel, 2 packets daily  2. BPH with LUTS -PSA pending -continue conservative management, avoiding bladder irritants and timed voiding's -tadalafil  5 mg daily  3. Prostate nodule -PSA pending -Prior prostate biopsy in 2014-negative  4. ED -tadalafil 20 mg, on-demand-dosing -Also gave a prescription for sildenafil 100 mg on-demand dosing as he has headaches with tadalafil 20 mg  No follow-ups on file.  These notes generated with voice recognition software. I apologize for typographical errors.  Cloretta Ned  [facility] [address]  [address] [place], [place] [address] [phone]

## 2022-11-11 ENCOUNTER — Other Ambulatory Visit
Admission: RE | Admit: 2022-11-11 | Discharge: 2022-11-11 | Disposition: A | Discharge status: Home / Self Care | Payer: BC Managed Care – PPO | Attending: Urology | Admitting: Urology

## 2022-11-11 ENCOUNTER — Encounter: Payer: Self-pay | Admitting: Urology

## 2022-11-11 ENCOUNTER — Ambulatory Visit (INDEPENDENT_AMBULATORY_CARE_PROVIDER_SITE_OTHER): Payer: BC Managed Care – PPO | Admitting: Urology

## 2022-11-11 VITALS — BP 144/89 | HR 89 | Ht 71.0 in | Wt 249.0 lb

## 2022-11-11 DIAGNOSIS — N402 Nodular prostate without lower urinary tract symptoms: Secondary | ICD-10-CM | POA: Diagnosis not present

## 2022-11-11 DIAGNOSIS — E291 Testicular hypofunction: Secondary | ICD-10-CM | POA: Diagnosis not present

## 2022-11-11 DIAGNOSIS — N400 Enlarged prostate without lower urinary tract symptoms: Secondary | ICD-10-CM

## 2022-11-11 DIAGNOSIS — E349 Endocrine disorder, unspecified: Secondary | ICD-10-CM

## 2022-11-11 DIAGNOSIS — N529 Male erectile dysfunction, unspecified: Secondary | ICD-10-CM

## 2022-11-11 DIAGNOSIS — N401 Enlarged prostate with lower urinary tract symptoms: Secondary | ICD-10-CM | POA: Diagnosis not present

## 2022-11-11 LAB — HEMOGLOBIN: Hemoglobin: 15.4 g/dL (ref 13.0–17.0)

## 2022-11-11 LAB — PSA: Prostatic Specific Antigen: 0.9 ng/mL (ref 0.00–4.00)

## 2022-11-11 LAB — HEMATOCRIT: HCT: 44.2 % (ref 39.0–52.0)

## 2022-11-11 MED ORDER — TESTOSTERONE 50 MG/5GM (1%) TD GEL
TRANSDERMAL | 3 refills | Status: DC
Start: 2022-11-11 — End: 2023-01-14

## 2022-11-11 MED ORDER — TADALAFIL 20 MG PO TABS: 20.0000 mg | ORAL_TABLET | Freq: Every day | ORAL | 3 refills | Status: DC | PRN

## 2022-11-11 MED ORDER — TADALAFIL 5 MG PO TABS
5.0000 mg | ORAL_TABLET | Freq: Every day | ORAL | 3 refills | Status: DC | PRN
Start: 2022-11-11 — End: 2023-11-20

## 2022-11-11 MED ORDER — SILDENAFIL CITRATE 100 MG PO TABS: 100.0000 mg | ORAL_TABLET | Freq: Every day | ORAL | 3 refills | Status: DC | PRN

## 2022-11-11 NOTE — Addendum Note (Signed)
Addended by: Consuella Lose on: 11/11/2022 11:15 AM   Modules accepted: Orders

## 2022-11-12 LAB — TESTOSTERONE: Testosterone: 410 ng/dL (ref 264–916)

## 2022-11-22 ENCOUNTER — Other Ambulatory Visit: Payer: Self-pay | Admitting: Family Medicine

## 2022-11-22 DIAGNOSIS — K219 Gastro-esophageal reflux disease without esophagitis: Secondary | ICD-10-CM

## 2022-11-22 DIAGNOSIS — I10 Essential (primary) hypertension: Secondary | ICD-10-CM

## 2022-11-26 ENCOUNTER — Ambulatory Visit: Payer: BC Managed Care – PPO | Admitting: Urology

## 2022-12-03 DIAGNOSIS — G4733 Obstructive sleep apnea (adult) (pediatric): Secondary | ICD-10-CM | POA: Diagnosis not present

## 2022-12-04 DIAGNOSIS — G4733 Obstructive sleep apnea (adult) (pediatric): Secondary | ICD-10-CM | POA: Diagnosis not present

## 2023-01-04 DIAGNOSIS — G4733 Obstructive sleep apnea (adult) (pediatric): Secondary | ICD-10-CM | POA: Diagnosis not present

## 2023-01-06 DIAGNOSIS — Z6834 Body mass index (BMI) 34.0-34.9, adult: Secondary | ICD-10-CM | POA: Diagnosis not present

## 2023-01-06 DIAGNOSIS — H66002 Acute suppurative otitis media without spontaneous rupture of ear drum, left ear: Secondary | ICD-10-CM | POA: Diagnosis not present

## 2023-01-10 ENCOUNTER — Ambulatory Visit: Payer: BC Managed Care – PPO | Admitting: Physician Assistant

## 2023-01-13 DIAGNOSIS — E349 Endocrine disorder, unspecified: Secondary | ICD-10-CM

## 2023-01-14 MED ORDER — TESTOSTERONE 50 MG/5GM (1%) TD GEL
TRANSDERMAL | 3 refills | Status: DC
Start: 2023-01-14 — End: 2023-09-10

## 2023-01-25 ENCOUNTER — Other Ambulatory Visit: Payer: Self-pay | Admitting: Family Medicine

## 2023-03-03 DIAGNOSIS — G4733 Obstructive sleep apnea (adult) (pediatric): Secondary | ICD-10-CM | POA: Diagnosis not present

## 2023-03-05 DIAGNOSIS — G4733 Obstructive sleep apnea (adult) (pediatric): Secondary | ICD-10-CM | POA: Diagnosis not present

## 2023-03-24 ENCOUNTER — Other Ambulatory Visit: Payer: Self-pay

## 2023-03-24 DIAGNOSIS — K219 Gastro-esophageal reflux disease without esophagitis: Secondary | ICD-10-CM

## 2023-03-24 DIAGNOSIS — I10 Essential (primary) hypertension: Secondary | ICD-10-CM

## 2023-03-24 MED ORDER — LISINOPRIL 10 MG PO TABS: ORAL_TABLET | ORAL | 0 refills | Status: DC

## 2023-03-24 MED ORDER — OMEPRAZOLE 20 MG PO CPDR: 20.0000 mg | DELAYED_RELEASE_CAPSULE | Freq: Every day | ORAL | 0 refills | Status: DC

## 2023-04-03 DIAGNOSIS — G4733 Obstructive sleep apnea (adult) (pediatric): Secondary | ICD-10-CM | POA: Diagnosis not present

## 2023-04-04 ENCOUNTER — Encounter: Payer: Self-pay | Admitting: Family Medicine

## 2023-05-01 DIAGNOSIS — G4733 Obstructive sleep apnea (adult) (pediatric): Secondary | ICD-10-CM | POA: Diagnosis not present

## 2023-05-08 ENCOUNTER — Other Ambulatory Visit: Payer: Self-pay

## 2023-05-08 ENCOUNTER — Other Ambulatory Visit: Payer: BC Managed Care – PPO

## 2023-05-08 DIAGNOSIS — E349 Endocrine disorder, unspecified: Secondary | ICD-10-CM

## 2023-05-08 DIAGNOSIS — E291 Testicular hypofunction: Secondary | ICD-10-CM

## 2023-05-09 ENCOUNTER — Other Ambulatory Visit: Payer: Self-pay

## 2023-05-09 DIAGNOSIS — E349 Endocrine disorder, unspecified: Secondary | ICD-10-CM

## 2023-05-09 DIAGNOSIS — E291 Testicular hypofunction: Secondary | ICD-10-CM

## 2023-05-09 LAB — HEMOGLOBIN AND HEMATOCRIT, BLOOD
Hematocrit: 43.8 % (ref 37.5–51.0)
Hemoglobin: 15.2 g/dL (ref 13.0–17.7)

## 2023-05-09 LAB — PSA: Prostate Specific Ag, Serum: 0.8 ng/mL (ref 0.0–4.0)

## 2023-05-09 LAB — TESTOSTERONE: Testosterone: 317 ng/dL (ref 264–916)

## 2023-05-28 ENCOUNTER — Encounter: Payer: Self-pay | Admitting: Family Medicine

## 2023-05-28 ENCOUNTER — Other Ambulatory Visit: Payer: Self-pay | Admitting: Family Medicine

## 2023-05-28 DIAGNOSIS — I10 Essential (primary) hypertension: Secondary | ICD-10-CM

## 2023-05-29 ENCOUNTER — Other Ambulatory Visit: Payer: Self-pay

## 2023-05-29 DIAGNOSIS — I10 Essential (primary) hypertension: Secondary | ICD-10-CM

## 2023-05-29 MED ORDER — LISINOPRIL 10 MG PO TABS: ORAL_TABLET | ORAL | 0 refills | Status: DC

## 2023-06-03 DIAGNOSIS — G4733 Obstructive sleep apnea (adult) (pediatric): Secondary | ICD-10-CM | POA: Diagnosis not present

## 2023-06-17 ENCOUNTER — Other Ambulatory Visit: Payer: Self-pay | Admitting: Family Medicine

## 2023-06-17 DIAGNOSIS — J329 Chronic sinusitis, unspecified: Secondary | ICD-10-CM

## 2023-06-17 NOTE — Telephone Encounter (Signed)
 Requested Prescriptions  Refused Prescriptions Disp Refills   fluticasone  (FLONASE ) 50 MCG/ACT nasal spray [Pharmacy Med Name: FLUTICASONE  NASAL SP (120) RX] 16 g 6    Sig: SHAKE LIQUID AND USE 2 SPRAYS IN EACH NOSTRIL DAILY     Ear, Nose, and Throat: Nasal Preparations - Corticosteroids Failed - 06/17/2023  4:49 PM      Failed - Valid encounter within last 12 months    Recent Outpatient Visits   None     Future Appointments             In 3 weeks Adeline Hone, PA-C [facility]   In 5 months McGowan, Nyra Bellis [facility] [place]

## 2023-06-21 ENCOUNTER — Other Ambulatory Visit: Payer: Self-pay | Admitting: Family Medicine

## 2023-06-21 DIAGNOSIS — K219 Gastro-esophageal reflux disease without esophagitis: Secondary | ICD-10-CM

## 2023-06-23 NOTE — Telephone Encounter (Signed)
 Requested Prescriptions  Pending Prescriptions Disp Refills   omeprazole  (PRILOSEC) 20 MG capsule [Pharmacy Med Name: OMEPRAZOLE  20MG  CAPSULES] 90 capsule 0    Sig: TAKE 1 CAPSULE(20 MG) BY MOUTH DAILY     Gastroenterology: Proton Pump Inhibitors Failed - 06/23/2023 11:02 AM      Failed - Valid encounter within last 12 months    Recent Outpatient Visits   None     Future Appointments             In 2 weeks Adeline Hone, PA-C [facility]   In 5 months McGowan, Nyra Bellis [facility] [place]

## 2023-07-03 DIAGNOSIS — G4733 Obstructive sleep apnea (adult) (pediatric): Secondary | ICD-10-CM | POA: Diagnosis not present

## 2023-07-04 ENCOUNTER — Ambulatory Visit: Payer: Self-pay

## 2023-07-04 ENCOUNTER — Ambulatory Visit
Admission: EM | Admit: 2023-07-04 | Discharge: 2023-07-04 | Disposition: A | Discharge status: Home / Self Care | Attending: Nurse Practitioner | Admitting: Nurse Practitioner

## 2023-07-04 DIAGNOSIS — J0140 Acute pansinusitis, unspecified: Secondary | ICD-10-CM

## 2023-07-04 LAB — POC SARS CORONAVIRUS 2 AG -  ED: SARS Coronavirus 2 Ag: NEGATIVE

## 2023-07-04 LAB — POCT RAPID STREP A (OFFICE): Rapid Strep A Screen: NEGATIVE

## 2023-07-04 MED ORDER — AMOXICILLIN-POT CLAVULANATE 875-125 MG PO TABS: 1.0000 | ORAL_TABLET | Freq: Two times a day (BID) | ORAL | 0 refills | Status: DC

## 2023-07-04 MED ORDER — FLUTICASONE PROPIONATE 50 MCG/ACT NA SUSP
2.0000 | Freq: Every day | NASAL | 0 refills | Status: DC
Start: 2023-07-04 — End: 2023-11-20

## 2023-07-04 NOTE — Discharge Instructions (Addendum)
 The COVID test and rapid strep test were negative. Take medication as directed. Increase fluids and get plenty of rest. May continue  over-the-counter ibuprofen  or Tylenol  as needed for pain, fever, or general discomfort. Recommend normal saline nasal spray to help with nasal congestion throughout the day. If you develop a cough, it will be helpful for you to take over-the-counter Delsym or Robitussin as needed. If symptoms fail to improve with this treatment, please follow-up with your primary care physician for further evaluation. Follow-up as needed.

## 2023-07-04 NOTE — ED Triage Notes (Signed)
 Pt reports he he has a sore throaty, cough with mucus, fatigue, nasal congestion x 4 days    Took dayquil, tlyenol, ibuporfen, and zyrtec but no relief

## 2023-07-04 NOTE — ED Provider Notes (Signed)
 [facility]    CSN: [id number] Arrival date & time: 07/04/23  0804      History   Chief Complaint No chief complaint on file.   HPI Jeffery [name] is a 53 y.o. male.   The history is provided by the patient.   Patient presents with a 4-day history of headache, nasal congestion, sinus pressure, fatigue, sore throat, and a mild cough.  Patient believes he had a fever at home, states he did not check it.  Denies ear pain, ear drainage, wheezing, difficulty breathing, chest pain, abdominal pain, nausea, vomiting, diarrhea, or rash.  Reports he has been taking over-the-counter DayQuil, Tylenol , ibuprofen , and Zyrtec with minimal relief.  Denies any obvious known sick contacts.  Patient with underlying history of seasonal allergies.  Past Medical History:  Diagnosis Date   Biceps tendon rupture, right, subsequent encounter 03/20/2017   Dental crowns present    sides and back   Dyslipidemia    Enlarged prostate    Epididymitis    Headache    sinus   Hypotestosteronism    Lower urinary tract symptoms (LUTS) 06/14/2016   Nodular prostate with lower urinary tract symptoms    Obesity    Sleep apnea 07/2013   Testosterone  deficiency    Umbilical hernia    Umbilical hernia without obstruction or gangrene     Patient Active Problem List   Diagnosis Date Noted   Prediabetes 01/29/2022   History of colonic polyps 01/25/2022   Adenomatous polyp of colon 01/25/2022   Hypertriglyceridemia 09/06/2020   Family history of colon cancer in mother 08/12/2019   Obstructive sleep apnea syndrome 08/12/2019   Current smoker 08/12/2019   Insulin  resistance 08/17/2018   Metabolic syndrome 08/17/2018   Gastroesophageal reflux disease without esophagitis 03/17/2018   Other chest pain 03/16/2018   Mixed hyperlipidemia 03/16/2018   Essential hypertension 12/22/2017   Hypogonadism male 09/19/2016   Family history of type 2 diabetes mellitus in mother 04/05/2016   Benign prostatic  hyperplasia with urinary obstruction 03/10/2015   Dyslipidemia 03/10/2015   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   Opioid type dependence, abuse (HCC) 03/10/2015   Class 2 severe obesity with serious comorbidity and body mass index (BMI) of 35.0 to 35.9 in adult [facility]) 03/10/2015   History of nasal septoplasty 11/23/2014   Chronic prostatitis 10/21/2014   Umbilical hernia 08/06/2013    Past Surgical History:  Procedure Laterality Date   COLONOSCOPY WITH PROPOFOL  N/A 01/25/2022   Procedure: COLONOSCOPY WITH PROPOFOL ;  Surgeon: Luke Salaam, MD;  Location: [facility] END[facility];  Service: Gastroenterology;  Laterality: N/A;   FRACTURE SURGERY  06/1997   HERNIA REPAIR  03/11/2014   umbilical hernia   NASAL SEPTOPLASTY W/ TURBINOPLASTY Bilateral 11/23/2014   Procedure: NASAL SEPTOPLASTY WITH TURBINATE REDUCTION;  Surgeon: Rogers Clayman, MD;  Location: [facility];  Service: ENT;  Laterality: Bilateral;   PROSTATE BIOPSY  2015   ROOT CANAL     WRIST FRACTURE SURGERY Right        Home Medications    Prior to Admission medications   Medication Sig Start Date End Date Taking? Authorizing Provider  amoxicillin -clavulanate (AUGMENTIN ) 875-125 MG tablet Take 1 tablet by mouth every 12 (twelve) hours. 07/04/23  Yes Leath-Warren, Belen Bowers, NP  fluticasone  (FLONASE ) 50 MCG/ACT nasal spray Place 2 sprays into both nostrils daily. 07/04/23  Yes Leath-Warren, Belen Bowers, NP  lisinopril  (ZESTRIL ) 10 MG tablet TAKE 1 TABLET BY MOUTH DAILY 05/29/23   Tapia, Leisa, PA-C  metFORMIN  (  GLUCOPHAGE ) 500 MG tablet TAKE 2 TABLETS(1000 MG) BY MOUTH TWICE DAILY WITH A MEAL 06/13/22   Tapia, Leisa, PA-C  omeprazole  (PRILOSEC) 20 MG capsule TAKE 1 CAPSULE(20 MG) BY MOUTH DAILY 06/23/23   Tapia, Leisa, PA-C  rosuvastatin  (CRESTOR ) 20 MG tablet Take 1 tablet (20 mg total) by mouth daily. 07/23/22   Tapia, Leisa, PA-C  sildenafil  (VIAGRA ) 100 MG tablet Take 1 tablet (100 mg total) by mouth daily as needed for erectile  dysfunction. Take two hours prior to intercourse on an empty stomach 11/11/22   McGowan, Cathleen Coach A, PA-C  tadalafil  (CIALIS ) 20 MG tablet Take 1 tablet (20 mg total) by mouth daily as needed for erectile dysfunction. 11/11/22   Matilde Son A, PA-C  tadalafil  (CIALIS ) 5 MG tablet Take 1 tablet (5 mg total) by mouth daily as needed for erectile dysfunction. 11/11/22   McGowan, Cathleen Coach A, PA-C  testosterone  (ANDROGEL ) 50 MG/5GM (1%) GEL APPLY CONTENTS OF 2 PACKETS TO CLEAN, DRY AND INTACT SKIN DAILY(AVOID FACE AND SCROTUM) 01/14/23   McGowan, Danne Dustman, PA-C    Family History Family History  Problem Relation Age of Onset   Diabetes Mother    Cancer Mother        Colon Cancer   Hypertension Father    Cancer Maternal Grandmother        Lung-  Non-Smoker    Social History Social History   Tobacco Use   Smoking status: Former    Current packs/day: 1.00    Types: E-cigarettes, Cigarettes   Smokeless tobacco: Current   Tobacco comments:    Former cigarette smoker 25-30 years 1 ppd total 25-30 packyear hx   Vaping Use   Vaping status: Every Day  Substance Use Topics   Alcohol use: No   Drug use: No     Allergies   Sulfa  antibiotics and Doxycycline   Review of Systems Review of Systems Per HPI  Physical Exam Triage Vital Signs ED Triage Vitals [07/04/23 0821]  Encounter Vitals Group     BP 114/77     Systolic BP Percentile      Diastolic BP Percentile      Pulse Rate 87     Resp 18     Temp 98.3 F (36.8 C)     Temp Source Oral     SpO2 93 %     Weight      Height      Head Circumference      Peak Flow      Pain Score 5     Pain Loc      Pain Education      Exclude from Growth Chart    No data found.  Updated Vital Signs BP 114/77 (BP Location: Right Arm)   Pulse 87   Temp 98.3 F (36.8 C) (Oral)   Resp 18   SpO2 93%   Visual Acuity Right Eye Distance:   Left Eye Distance:   Bilateral Distance:    Right Eye Near:   Left Eye Near:    Bilateral  Near:     Physical Exam Vitals and nursing note reviewed.  Constitutional:      General: He is not in acute distress.    Appearance: Normal appearance.  HENT:     Head: Normocephalic.     Right Ear: Tympanic membrane, ear canal and external ear normal.     Left Ear: Tympanic membrane, ear canal and external ear normal.     Nose: Congestion present.  Right Turbinates: Enlarged and swollen.     Left Turbinates: Enlarged and swollen.     Right Sinus: Maxillary sinus tenderness and frontal sinus tenderness present.     Left Sinus: Maxillary sinus tenderness and frontal sinus tenderness present.     Mouth/Throat:     Lips: Pink.     Mouth: Mucous membranes are moist.     Pharynx: Uvula midline. Posterior oropharyngeal erythema and postnasal drip present. No pharyngeal swelling, oropharyngeal exudate or uvula swelling.     Comments: Cobblestoning present to posterior oropharynx  Eyes:     Extraocular Movements: Extraocular movements intact.     Conjunctiva/sclera: Conjunctivae normal.     Pupils: Pupils are equal, round, and reactive to light.  Cardiovascular:     Rate and Rhythm: Normal rate and regular rhythm.     Pulses: Normal pulses.     Heart sounds: Normal heart sounds.  Pulmonary:     Effort: Pulmonary effort is normal.     Breath sounds: Normal breath sounds.  Abdominal:     General: Bowel sounds are normal.     Palpations: Abdomen is soft.     Tenderness: There is no abdominal tenderness.  Musculoskeletal:     Cervical back: Normal range of motion.  Skin:    General: Skin is warm and dry.  Neurological:     General: No focal deficit present.     Mental Status: He is alert and oriented to person, place, and time.  Psychiatric:        Mood and Affect: Mood normal.        Behavior: Behavior normal.     UC Treatments / Results  Labs (all labs ordered are listed, but only abnormal results are displayed) Labs Reviewed  POCT RAPID STREP A (OFFICE) - Normal  POC  SARS CORONAVIRUS 2 AG -  ED    EKG   Radiology No results found.  Procedures Procedures (including critical care time)  Medications Ordered in UC Medications - No data to display  Initial Impression / Assessment and Plan / UC Course  I have reviewed the triage vital signs and the nursing notes.  Pertinent labs & imaging results that were available during my care of the patient were reviewed by me and considered in my medical decision making (see chart for details).  On exam, lung sounds are clear throughout, room air sats at 93%.  Patient does have moderate both maxillary and frontal sinus tenderness.  He also reports underlying subjective fever.  Will cover for acute pansinusitis with Augmentin  875/125 mg tablets twice daily for the next 7 days and fluticasone  50 mcg nasal spray for nasal congestion and runny nose.  Supportive care recommendations were provided and discussed with the patient to include fluids, rest, over-the-counter analgesics, and normal saline nasal spray.  Discussed indications with patient regarding follow-up.  Patient was in agreement with this plan of care and verbalizes understanding.  All questions were answered.  Patient stable for discharge.  Final Clinical Impressions(s) / UC Diagnoses   Final diagnoses:  Acute pansinusitis, recurrence not specified     Discharge Instructions      The COVID test and rapid strep test were negative. Take medication as directed. Increase fluids and get plenty of rest. May continue  over-the-counter ibuprofen  or Tylenol  as needed for pain, fever, or general discomfort. Recommend normal saline nasal spray to help with nasal congestion throughout the day. If you develop a cough, it will be helpful for you to  take over-the-counter Delsym or Robitussin as needed. If symptoms fail to improve with this treatment, please follow-up with your primary care physician for further evaluation. Follow-up as needed.    ED  Prescriptions     Medication Sig Dispense Auth. Provider   amoxicillin -clavulanate (AUGMENTIN ) 875-125 MG tablet Take 1 tablet by mouth every 12 (twelve) hours. 14 tablet Leath-Warren, Belen Bowers, NP   fluticasone  (FLONASE ) 50 MCG/ACT nasal spray Place 2 sprays into both nostrils daily. 16 g Leath-Warren, Belen Bowers, NP      PDMP not reviewed this encounter.   Hardy Lia, NP 07/04/23 1359

## 2023-07-07 ENCOUNTER — Ambulatory Visit: Payer: Self-pay

## 2023-07-07 NOTE — Telephone Encounter (Signed)
  Chief Complaint: shortness of breath Symptoms: sob, cough Frequency: intermittent Pertinent Negatives: Patient denies fever, difficulty breathing Disposition: [] ED /[] Urgent Care (no appt availability in office) / [x] Appointment(In office/virtual)/ []  [facility]/ [] Home Care/ [] Refused Recommended Disposition /[] [facility]/ []  Follow-up with PCP Additional Notes:  Friday evaluated at urgent care, diagnosed with sinus infection, started antibiotics. Calling today for new onset shortness of breath with cough when laying down, and increased fatigue. Sinus symptoms have improved. Acute evaluation advised for new shortness of breath, offered next available acute visit today but he refuses due to being at work, scheduled acute visit with PCP on 07/08/23. Educated on care advice as documented in protocol, patient verbalized understanding. Discussed reasons to call back or to seek emergency evaluation.    Copied from CRM [id number]. Topic: Clinical - Red Word Triage >> Jul 07, 2023 11:48 AM Everlene Hobby D wrote: Was seen at urgent care Friday and says his cough has gotten worse. At night it seems worse instead of better. Was given antibiotics.Some trouble breathing says it's not normal especially at night. Reason for Disposition  [1] Taking antibiotics > 24 hours AND [2] symptoms WORSE  Protocols used: Infection on Antibiotic Follow-up Call-[facility]

## 2023-07-08 ENCOUNTER — Encounter: Payer: Self-pay | Admitting: Family Medicine

## 2023-07-08 ENCOUNTER — Ambulatory Visit: Admitting: Family Medicine

## 2023-07-08 VITALS — BP 130/78 | HR 87 | Temp 97.8°F | Resp 16 | Ht 71.0 in | Wt 255.0 lb

## 2023-07-08 DIAGNOSIS — R0602 Shortness of breath: Secondary | ICD-10-CM

## 2023-07-08 DIAGNOSIS — R0781 Pleurodynia: Secondary | ICD-10-CM | POA: Diagnosis not present

## 2023-07-08 DIAGNOSIS — Z87891 Personal history of nicotine dependence: Secondary | ICD-10-CM

## 2023-07-08 DIAGNOSIS — J302 Other seasonal allergic rhinitis: Secondary | ICD-10-CM

## 2023-07-08 DIAGNOSIS — G4733 Obstructive sleep apnea (adult) (pediatric): Secondary | ICD-10-CM

## 2023-07-08 DIAGNOSIS — J069 Acute upper respiratory infection, unspecified: Secondary | ICD-10-CM | POA: Diagnosis not present

## 2023-07-08 DIAGNOSIS — F17200 Nicotine dependence, unspecified, uncomplicated: Secondary | ICD-10-CM

## 2023-07-08 MED ORDER — AZITHROMYCIN 250 MG PO TABS: ORAL_TABLET | ORAL | 0 refills | Status: AC

## 2023-07-08 MED ORDER — LEVOCETIRIZINE DIHYDROCHLORIDE 5 MG PO TABS: 5.0000 mg | ORAL_TABLET | Freq: Every evening | ORAL | 1 refills | Status: DC

## 2023-07-08 MED ORDER — PROMETHAZINE-DM 6.25-15 MG/5ML PO SYRP
2.5000 mL | ORAL_SOLUTION | Freq: Four times a day (QID) | ORAL | 0 refills | Status: DC | PRN
Start: 2023-07-08 — End: 2023-07-14

## 2023-07-08 MED ORDER — ALBUTEROL SULFATE HFA 108 (90 BASE) MCG/ACT IN AERS: 2.0000 | INHALATION_SPRAY | RESPIRATORY_TRACT | 0 refills | Status: DC | PRN

## 2023-07-08 NOTE — Patient Instructions (Signed)
 For acute bronchitis  Use albuterol  inhaler as needed (will use more today and for the next few days, and then you should be able to use less 3 days from now)  Mucinex  over the counter - it helps break up mucous in your chest.  You can get Mucinex  or mucinex -D or -DM You can ask the pharmacy staff help you find without other meds that can raise your blood pressure  The zpak plus your augmentin  you are already on will cover for any lung infections/pneumonia   Drink ample clear fluids  REST!  Take tylenol  as needed for fever and/or aches  Cough may linger for a few weeks or sometimes up to 6 weeks, which is normal!    Please return to be rechecked if you are feeling worse at any point, or if you do not feel improved much in the next 1-2 weeks.  Concerning new or worsening symptoms include chest pain, shortness of breath, fever, coughing up blood, night sweats, unintentional weight loss, confusion, decreased urine.

## 2023-07-08 NOTE — Progress Notes (Signed)
 Patient ID: Jeffery [name], male    DOB: 1970-08-26, 53 y.o.   MRN: [id number]  PCP: Adeline Hone, PA-C  Chief Complaint  Patient presents with   Cough    X1 week, productive   Shortness of Breath    Subjective:   AMIN DORROUGH is a 53 y.o. male, presents to clinic with CC of the following:  HPI  Here for SOB Last OV about one year ago, he has not come for routine f/up q 6 months as recommended Sig smoking hx Recent UC visit on 5/9 dx with sinusitis and put on augmentin  and flonase  URI sx started about 5/5 (now ~8 d ago)  Chest sx have started with same illness, he notes pressure in chest, coughing, worse at night when he lays down and wears CPAP, coughing a lot, no improvement with mucinex  but possibly some with the antibiotics Still smoking, but trying to cut back he started vaping but thinks it may be worse than smoking He has some pain to left side when taking a deep breath, no fever, sweats chills No palpitations, DOE, LE edema   Patient Active Problem List   Diagnosis Date Noted   Prediabetes 01/29/2022   History of colonic polyps 01/25/2022   Adenomatous polyp of colon 01/25/2022   Hypertriglyceridemia 09/06/2020   Family history of colon cancer in mother 08/12/2019   Obstructive sleep apnea syndrome 08/12/2019   Current smoker 08/12/2019   Insulin  resistance 08/17/2018   Metabolic syndrome 08/17/2018   Gastroesophageal reflux disease without esophagitis 03/17/2018   Other chest pain 03/16/2018   Mixed hyperlipidemia 03/16/2018   Essential hypertension 12/22/2017   Hypogonadism male 09/19/2016   Family history of type 2 diabetes mellitus in mother 04/05/2016   Benign prostatic hyperplasia with urinary obstruction 03/10/2015   Dyslipidemia 03/10/2015   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   Opioid type dependence, abuse (HCC) 03/10/2015   Class 2 severe obesity with serious comorbidity and body mass index (BMI) of 35.0 to 35.9 in adult [facility]) 03/10/2015    History of nasal septoplasty 11/23/2014   Chronic prostatitis 10/21/2014   Umbilical hernia 08/06/2013      Current Outpatient Medications:    albuterol  (VENTOLIN  HFA) 108 (90 Base) MCG/ACT inhaler, Inhale 2 puffs into the lungs every 4 (four) hours as needed for wheezing or shortness of breath (coughing fits)., Disp: 8 g, Rfl: 0   amoxicillin -clavulanate (AUGMENTIN ) 875-125 MG tablet, Take 1 tablet by mouth every 12 (twelve) hours., Disp: 14 tablet, Rfl: 0   azithromycin  (ZITHROMAX ) 250 MG tablet, Take 2 tablets on day 1, then 1 tablet daily on days 2 through 5, Disp: 6 tablet, Rfl: 0   fluticasone  (FLONASE ) 50 MCG/ACT nasal spray, Place 2 sprays into both nostrils daily., Disp: 16 g, Rfl: 0   levocetirizine (XYZAL ) 5 MG tablet, Take 1 tablet (5 mg total) by mouth every evening., Disp: 90 tablet, Rfl: 1   lisinopril  (ZESTRIL ) 10 MG tablet, TAKE 1 TABLET BY MOUTH DAILY, Disp: 90 tablet, Rfl: 0   metFORMIN  (GLUCOPHAGE ) 500 MG tablet, TAKE 2 TABLETS(1000 MG) BY MOUTH TWICE DAILY WITH A MEAL, Disp: 180 tablet, Rfl: 0   omeprazole  (PRILOSEC) 20 MG capsule, TAKE 1 CAPSULE(20 MG) BY MOUTH DAILY, Disp: 90 capsule, Rfl: 0   promethazine -dextromethorphan (PROMETHAZINE -DM) 6.25-15 MG/5ML syrup, Take 2.5-5 mLs by mouth 4 (four) times daily as needed., Disp: 118 mL, Rfl: 0   rosuvastatin  (CRESTOR ) 20 MG tablet, Take 1 tablet (20 mg total) by mouth daily.,  Disp: 90 tablet, Rfl: 1   sildenafil  (VIAGRA ) 100 MG tablet, Take 1 tablet (100 mg total) by mouth daily as needed for erectile dysfunction. Take two hours prior to intercourse on an empty stomach, Disp: 90 tablet, Rfl: 3   tadalafil  (CIALIS ) 20 MG tablet, Take 1 tablet (20 mg total) by mouth daily as needed for erectile dysfunction., Disp: 90 tablet, Rfl: 3   tadalafil  (CIALIS ) 5 MG tablet, Take 1 tablet (5 mg total) by mouth daily as needed for erectile dysfunction., Disp: 90 tablet, Rfl: 3   testosterone  (ANDROGEL ) 50 MG/5GM (1%) GEL, APPLY CONTENTS  OF 2 PACKETS TO CLEAN, DRY AND INTACT SKIN DAILY(AVOID FACE AND SCROTUM), Disp: 300 g, Rfl: 3   Allergies  Allergen Reactions   Sulfa  Antibiotics Nausea Only   Doxycycline Other (See Comments) and Nausea And Vomiting     Social History   Tobacco Use   Smoking status: Former    Current packs/day: 1.00    Types: E-cigarettes, Cigarettes   Smokeless tobacco: Current   Tobacco comments:    Former cigarette smoker 25-30 years 1 ppd total 25-30 packyear hx   Vaping Use   Vaping status: Every Day  Substance Use Topics   Alcohol use: No   Drug use: No      Chart Review Today: I personally reviewed active problem list, medication list, allergies, family history, social history, health maintenance, notes from last encounter, lab results, imaging with the patient/caregiver today.    Review of Systems  All other systems reviewed and are negative.      Objective:   Vitals:   07/08/23 0838  BP: 130/78  Pulse: 87  Resp: 16  Temp: 97.8 F (36.6 C)  SpO2: 95%  Weight: 255 lb (115.7 kg)  Height: 5\' 11"  (1.803 m)    Body mass index is 35.57 kg/m.  Physical Exam Vitals and nursing note reviewed.  Constitutional:      General: He is not in acute distress.    Appearance: Normal appearance. He is well-developed. He is obese. He is not ill-appearing, toxic-appearing or diaphoretic.  HENT:     Head: Normocephalic and atraumatic.     Jaw: No trismus.     Right Ear: Tympanic membrane, ear canal and external ear normal.     Left Ear: Tympanic membrane, ear canal and external ear normal.     Nose: Congestion and rhinorrhea present. Rhinorrhea is clear.     Right Turbinates: Enlarged and swollen.     Left Turbinates: Enlarged and swollen.     Right Sinus: No maxillary sinus tenderness or frontal sinus tenderness.     Left Sinus: No maxillary sinus tenderness or frontal sinus tenderness.     Mouth/Throat:     Mouth: Mucous membranes are moist. Mucous membranes are not pale, not  dry and not cyanotic.     Pharynx: Uvula midline. Pharyngeal swelling, posterior oropharyngeal erythema and postnasal drip present. No oropharyngeal exudate or uvula swelling.     Tonsils: No tonsillar exudate or tonsillar abscesses.  Eyes:     General: Lids are normal. No scleral icterus.       Right eye: No discharge.        Left eye: No discharge.     Conjunctiva/sclera: Conjunctivae normal.  Neck:     Trachea: Trachea and phonation normal. No tracheal deviation.  Cardiovascular:     Rate and Rhythm: Normal rate and regular rhythm. No extrasystoles are present.    Chest Wall: PMI is  not displaced.     Pulses:          Radial pulses are 2+ on the right side and 2+ on the left side.     Heart sounds: Normal heart sounds. No murmur heard.    No friction rub. No gallop.  Pulmonary:     Effort: Pulmonary effort is normal. No tachypnea, accessory muscle usage, respiratory distress or retractions.     Breath sounds: Normal breath sounds. No stridor. No decreased breath sounds, wheezing, rhonchi or rales.  Abdominal:     General: Bowel sounds are normal. There is no distension.     Palpations: Abdomen is soft.     Tenderness: There is no abdominal tenderness.  Musculoskeletal:     Cervical back: Normal range of motion and neck supple.     Right lower leg: No edema.     Left lower leg: No edema.  Lymphadenopathy:     Head:     Right side of head: No submental, submandibular or tonsillar adenopathy.     Left side of head: No submental, submandibular or tonsillar adenopathy.     Cervical: No cervical adenopathy.  Skin:    General: Skin is warm and dry.     Capillary Refill: Capillary refill takes less than 2 seconds.     Coloration: Skin is not pale.     Findings: No rash.     Nails: There is no clubbing.  Neurological:     Mental Status: He is alert and oriented to person, place, and time.     Motor: No abnormal muscle tone.     Coordination: Coordination normal.     Gait: Gait  normal.  Psychiatric:        Mood and Affect: Mood normal.        Speech: Speech normal.        Behavior: Behavior normal. Behavior is cooperative.      Results for orders placed or performed during the hospital encounter of 07/04/23  POCT rapid strep A   Collection Time: 07/04/23  8:35 AM  Result Value Ref Range   Rapid Strep A Screen Negative   POC SARS Coronavirus 2 Ag-ED - Nasal Swab   Collection Time: 07/04/23  8:48 AM  Result Value Ref Range   SARS Coronavirus 2 Ag Negative Negative       Assessment & Plan:     ICD-10-CM   1. Upper respiratory tract infection, unspecified type  J06.9 promethazine -dextromethorphan (PROMETHAZINE -DM) 6.25-15 MG/5ML syrup   continued sx, supportive and sx tx in addition to the abx hes on, VSS, nose and throat still swollen & red with discharge/postnasal drip    2. Shortness of breath  R06.02 albuterol  (VENTOLIN  HFA) 108 (90 Base) MCG/ACT inhaler    promethazine -dextromethorphan (PROMETHAZINE -DM) 6.25-15 MG/5ML syrup    azithromycin  (ZITHROMAX ) 250 MG tablet    Ambulatory referral to Pulmonology   cough/congestion pressure esp at night worse with lying down and using cpap in setting of URI with suspected lung disease Cough is mostly non-productive, he's having some improvement in the past couple days Will try mucinex , inhaler, cold/allergy meds, covering for possible CAP If any worsening will need to f/up and probably need CXR  Lungs CTA today, afebrile, no increased WOB    3. Pleuritic chest pain  R07.81 azithromycin  (ZITHROMAX ) 250 MG tablet   some minor discomfort to left mid chest with deep inspiration, already on augmentin  from UC, add zpak to cover for CAP    4.  Current smoker  F17.200 Ambulatory referral to Pulmonology    Ambulatory Referral Lung Cancer Screening [facility]   trying to cut back amount of cigarettes    5. Obstructive sleep apnea syndrome  G47.33 Ambulatory referral to Pulmonology   CPAP managed by ENT/[facility]     6. Seasonal allergies  J30.2 levocetirizine (XYZAL ) 5 MG tablet   encouraged pt to be on allergy meds and nasal sprays to help reduce swelling and post nasal drip which can cause cough and worse nightime sx    7. Personal history of tobacco use, presenting hazards to health  Z87.891 Ambulatory referral to Pulmonology    Ambulatory Referral Lung Cancer Screening [facility]   35+ pack year hx, current smoker          Adeline Hone, PA-C 07/08/23 9:21 AM

## 2023-07-11 ENCOUNTER — Encounter: Payer: Self-pay | Admitting: Nurse Practitioner

## 2023-07-14 ENCOUNTER — Ambulatory Visit (INDEPENDENT_AMBULATORY_CARE_PROVIDER_SITE_OTHER): Admitting: Family Medicine

## 2023-07-14 ENCOUNTER — Encounter: Payer: Self-pay | Admitting: Family Medicine

## 2023-07-14 VITALS — BP 132/84 | HR 89 | Resp 16 | Ht 71.0 in | Wt 257.0 lb

## 2023-07-14 DIAGNOSIS — E781 Pure hyperglyceridemia: Secondary | ICD-10-CM | POA: Diagnosis not present

## 2023-07-14 DIAGNOSIS — Z0000 Encounter for general adult medical examination without abnormal findings: Secondary | ICD-10-CM

## 2023-07-14 DIAGNOSIS — Z23 Encounter for immunization: Secondary | ICD-10-CM

## 2023-07-14 DIAGNOSIS — F17200 Nicotine dependence, unspecified, uncomplicated: Secondary | ICD-10-CM | POA: Diagnosis not present

## 2023-07-14 DIAGNOSIS — E782 Mixed hyperlipidemia: Secondary | ICD-10-CM | POA: Diagnosis not present

## 2023-07-14 NOTE — Patient Instructions (Signed)
 Health Maintenance  Topic Date Due   COVID-19 Vaccine (4 - 2024-25 season) 07/30/2023*   Zoster (Shingles) Vaccine (1 of 2) 10/14/2023*   Flu Shot  09/26/2023   DTaP/Tdap/Td vaccine (3 - Td or Tdap) 12/19/2026   Colon Cancer Screening  01/26/2027   Pneumococcal Vaccination  Completed   Hepatitis C Screening  Completed   HIV Screening  Completed   HPV Vaccine  Aged Out   Meningitis B Vaccine  Aged Out  *Topic was postponed. The date shown is not the original due date.

## 2023-07-14 NOTE — Progress Notes (Signed)
 Patient: Jeffery [name], Male    DOB: 08/03/1970, 53 y.o.   MRN: [id number] Adeline Hone, PA-C Visit Date: 07/18/2023  Today's Provider: Adeline Hone, PA-C   Chief Complaint  Patient presents with   Annual Exam   Subjective:   Annual physical exam:  Jeffery [name] is a 53 y.o. male who presents today for health maintenance and annual & complete physical exam.   Exercise/Activity: exercising 2 d a week for hours - active, job physical activity  Diet/nutrition:  needs to work on it Sleep:  no concerns  SDOH Screenings   Food Insecurity: No Food Insecurity (07/13/2023)  Housing: Low Risk  (07/13/2023)  Transportation Needs: No Transportation Needs (07/13/2023)  Utilities: Not At Risk (07/14/2023)  Alcohol Screen: Low Risk  (07/14/2023)  Depression (PHQ2-9): Low Risk  (07/14/2023)  Financial Resource Strain: Low Risk  (07/13/2023)  Physical Activity: Sufficiently Active (07/13/2023)  Social Connections: Socially Integrated (07/13/2023)  Stress: No Stress Concern Present (07/13/2023)  Tobacco Use: Medium Risk (07/14/2023)  Health Literacy: Adequate Health Literacy (07/14/2023)    USPSTF grade A and B recommendations - reviewed and addressed today  Depression:  Phq 9 completed today by patient, was reviewed by me with patient in the room, score is  negative, pt feels good     07/14/2023    2:16 PM 07/09/2022    9:04 AM 06/12/2022   10:18 AM  Depression screen PHQ 2/9  Decreased Interest 0 0 0  Down, Depressed, Hopeless 0 0 0  PHQ - 2 Score 0 0 0  Altered sleeping 0 0 0  Tired, decreased energy 0 0 0  Change in appetite 0 0 0  Feeling bad or failure about yourself  0 0 0  Trouble concentrating 0 0 0  Moving slowly or fidgety/restless 0 0 0  Suicidal thoughts 0 0 0  PHQ-9 Score 0 0 0  Difficult doing work/chores  Not difficult at all Not difficult at all    Hep C Screening: done   STD testing and prevention (HIV/chl/gon/syphilis):    Intimate partner violence:   safe -  wife, 1 of the kids at home son  Advanced Care Planning:  A voluntary discussion about advance care planning including the explanation and discussion of advance directives.  Discussed health care proxy and Living will, and the patient was able to identify a health care proxy as [name].  Patient does not have a living will at present time. If patient does have living will, I have requested they bring this to the clinic to be scanned in to their chart.  Health Maintenance  Topic Date Due   Lung Cancer Screening  Never done   COVID-19 Vaccine (4 - 2024-25 season) 07/30/2023 (Originally 10/27/2022)   Zoster Vaccines- Shingrix (1 of 2) 10/14/2023 (Originally 12/25/1991)   INFLUENZA VACCINE  09/26/2023   DTaP/Tdap/Td (3 - Td or Tdap) 12/19/2026   Colonoscopy  01/26/2027   Pneumococcal Vaccine 5-52 Years old  Completed   Hepatitis C Screening  Completed   HIV Screening  Completed   HPV VACCINES  Aged Out   Meningococcal B Vaccine  Aged Out    Skin cancer:   last skin survey was.  Pt reports no hx of skin cancer, suspicious lesions/biopsies in the past.  Colorectal cancer:  colonoscopy is UTD Pt denies change in bowels, blood in stool  Prostate cancer:  Prostate cancer screening with PSA: Discussed risks and benefits of PSA testing and provided handout. Pt declines to  have PSA drawn today. Doing with urology Lab Results  Component Value Date   PSA Normal 07/05/2013    Urinary Symptoms:   IPSS     Row Name 07/14/23 1422         International Prostate Symptom Score   How often have you had the sensation of not emptying your bladder? Not at All     How often have you had to urinate less than every two hours? Not at All     How often have you found you stopped and started again several times when you urinated? Not at All     How often have you found it difficult to postpone urination? Not at All     How often have you had a weak urinary stream? Not at All     How often have you had to  strain to start urination? Not at All     How many times did you typically get up at night to urinate? 1 Time     Total IPSS Score 1              Lung cancer:   Low Dose CT Chest recommended if Age 26-80 years, 20 pack-year currently smoking OR have quit w/in 15years. Patient does qualify.   Social History   Tobacco Use   Smoking status: Former    Current packs/day: 0.00    Average packs/day: 1 pack/day for 30.0 years (30.0 ttl pk-yrs)    Types: Cigarettes, E-cigarettes    Start date: 02/25/1990    Quit date: 02/26/2020    Years since quitting: 3.3   Smokeless tobacco: Former   Tobacco comments:    Former cigarette smoker 25-30 years 1 ppd total 25-30 packyear hx quit in 2022    Past dip very occasionally like when hunting  Substance Use Topics   Alcohol use: No     Alcohol screening: Loss adjuster, chartered Office Visit from 07/14/2023 in [facility]  AUDIT-C Score 0       AAA:  The USPSTF recommends one-time screening with ultrasonography in men ages 42 to 30 years who have ever smoked  ECG:not indicated today   Blood pressure/Hypertension: BP Readings from Last 3 Encounters:  07/14/23 132/84  07/08/23 130/78  07/04/23 114/77   Weight/Obesity: Wt Readings from Last 3 Encounters:  07/14/23 257 lb (116.6 kg)  07/08/23 255 lb (115.7 kg)  11/11/22 249 lb (112.9 kg)   BMI Readings from Last 3 Encounters:  07/14/23 35.84 kg/m  07/08/23 35.57 kg/m  11/11/22 34.73 kg/m    Lipids:  Lab Results  Component Value Date   CHOL 287 (H) 07/09/2022   CHOL 225 (H) 01/04/2022   CHOL 238 (H) 09/04/2020   Lab Results  Component Value Date   HDL 33 (L) 07/09/2022   HDL 34 (L) 01/04/2022   HDL 28 (L) 09/04/2020   Lab Results  Component Value Date   [facility]  07/09/2022     Comment:     . LDL cholesterol not calculated. Triglyceride levels greater than 400 mg/dL invalidate calculated LDL results. . Reference range: <100 . Desirable range <100  mg/dL for primary prevention;   <70 mg/dL for patients with CHD or diabetic patients  with > or = 2 CHD risk factors. Aaron Aas LDL-C is now calculated using the Martin-Hopkins  calculation, which is a validated novel method providing  better accuracy than the Friedewald equation in the  estimation of LDL-C.  Melinda Sprawls et al. Erroll Heard.  2841;324(40): 2061-2068  (http://education.[facility].com/faq/FAQ164)    LDLCALC 131 (H) 01/04/2022   LDLCALC  09/04/2020     Comment:     . LDL cholesterol not calculated. Triglyceride levels greater than 400 mg/dL invalidate calculated LDL results. . Reference range: <100 . Desirable range <100 mg/dL for primary prevention;   <70 mg/dL for patients with CHD or diabetic patients  with > or = 2 CHD risk factors. Aaron Aas LDL-C is now calculated using the Martin-Hopkins  calculation, which is a validated novel method providing  better accuracy than the Friedewald equation in the  estimation of LDL-C.  Melinda Sprawls et al. Erroll Heard. 1027;253(66): 2061-2068  (http://education.[facility].com/faq/FAQ164)    Lab Results  Component Value Date   TRIG 517 (H) 07/09/2022   TRIG 393 (H) 01/04/2022   TRIG 1,161 (H) 09/04/2020   Lab Results  Component Value Date   CHOLHDL 8.7 (H) 07/09/2022   CHOLHDL 6.6 (H) 01/04/2022   CHOLHDL 8.5 (H) 09/04/2020   Lab Results  Component Value Date   LDLDIRECT 105 (H) 07/14/2023   LDLDIRECT 134 (H) 07/09/2022   Based on the results of lipid panel his/her cardiovascular risk factor ( using [facility] )  in the next 10 years is : The 10-year ASCVD risk score (Arnett DK, et al., 2019) is: 10.8%   Values used to calculate the score:     Age: 47 years     Sex: Male     Is Non-Hispanic African American: No     Diabetic: No     Tobacco smoker: No     Systolic Blood Pressure: 132 mmHg     Is BP treated: Yes     HDL Cholesterol: 33 mg/dL     Total Cholesterol: 287 mg/dL Glucose:  Glucose, Bld  Date Value Ref Range Status   07/09/2022 83 65 - 99 mg/dL Final    Comment:    .            Fasting reference interval .   04/17/2022 84 65 - 99 mg/dL Final    Comment:    .            Fasting reference interval .   01/04/2022 96 65 - 99 mg/dL Final    Comment:    .            Fasting reference interval .     Social History       Social History   Socioeconomic History   Marital status: Married    Spouse name: [name]   Number of children: 3   Years of education: Not on file   Highest education level: 12th grade  Occupational History   Occupation: IT sales professional  Tobacco Use   Smoking status: Former    Current packs/day: 0.00    Average packs/day: 1 pack/day for 30.0 years (30.0 ttl pk-yrs)    Types: Cigarettes, E-cigarettes    Start date: 02/25/1990    Quit date: 02/26/2020    Years since quitting: 3.3   Smokeless tobacco: Former   Tobacco comments:    Former cigarette smoker 25-30 years 1 ppd total 25-30 packyear hx quit in 2022    Past dip very occasionally like when hunting  Vaping Use   Vaping status: Every Day   Substances: Nicotine  Substance and Sexual Activity   Alcohol use: No   Drug use: No   Sexual activity: Yes    Partners: Female  Other Topics Concern   Not on file  Social  History Narrative   Not on file   Social Drivers of Health   Financial Resource Strain: Low Risk  (07/13/2023)   Overall Financial Resource Strain (CARDIA)    Difficulty of Paying Living Expenses: Not hard at all  Food Insecurity: No Food Insecurity (07/13/2023)   Hunger Vital Sign    Worried About Running Out of Food in the Last Year: Never true    Ran Out of Food in the Last Year: Never true  Transportation Needs: No Transportation Needs (07/13/2023)   PRAPARE - Administrator, Civil Service (Medical): No    Lack of Transportation (Non-Medical): No  Physical Activity: Sufficiently Active (07/13/2023)   Exercise Vital Sign    Days of Exercise per Week: 2 days    Minutes of Exercise per  Session: 120 min  Stress: No Stress Concern Present (07/13/2023)   [employer] of Occupational Health - Occupational Stress Questionnaire    Feeling of Stress : Not at all  Social Connections: Socially Integrated (07/13/2023)   Social Connection and Isolation Panel [NHANES]    Frequency of Communication with Friends and Family: More than three times a week    Frequency of Social Gatherings with Friends and Family: Twice a week    Attends Religious Services: More than 4 times per year    Active Member of [employer] or Organizations: Yes    Attends Engineer, structural: More than 4 times per year    Marital Status: Married    Family History        Family History  Problem Relation Age of Onset   Stroke Mother    Diabetes Mother    Cancer Mother        Colon Cancer   Alzheimer's disease Father    Hypertension Father    Cancer Maternal Grandmother        Lung-  Non-Smoker    Patient Active Problem List   Diagnosis Date Noted   Prediabetes 01/29/2022   History of colonic polyps 01/25/2022   Adenomatous polyp of colon 01/25/2022   Hypertriglyceridemia 09/06/2020   Family history of colon cancer in mother 08/12/2019   Obstructive sleep apnea syndrome 08/12/2019   Current smoker 08/12/2019   Insulin  resistance 08/17/2018   Metabolic syndrome 08/17/2018   Gastroesophageal reflux disease without esophagitis 03/17/2018   Other chest pain 03/16/2018   Mixed hyperlipidemia 03/16/2018   Essential hypertension 12/22/2017   Hypogonadism male 09/19/2016   Family history of type 2 diabetes mellitus in mother 04/05/2016   Benign prostatic hyperplasia with urinary obstruction 03/10/2015   Dyslipidemia 03/10/2015   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   Opioid type dependence, abuse (HCC) 03/10/2015   Class 2 severe obesity with serious comorbidity and body mass index (BMI) of 35.0 to 35.9 in adult [facility]) 03/10/2015   History of nasal septoplasty 11/23/2014   Chronic prostatitis  10/21/2014   Umbilical hernia 08/06/2013    Past Surgical History:  Procedure Laterality Date   COLONOSCOPY WITH PROPOFOL  N/A 01/25/2022   Procedure: COLONOSCOPY WITH PROPOFOL ;  Surgeon: Luke Salaam, MD;  Location: [facility] ENDOSCOPY;  Service: Gastroenterology;  Laterality: N/A;   FRACTURE SURGERY  06/1997   HERNIA REPAIR  03/11/2014   umbilical hernia   NASAL SEPTOPLASTY W/ TURBINOPLASTY Bilateral 11/23/2014   Procedure: NASAL SEPTOPLASTY WITH TURBINATE REDUCTION;  Surgeon: Rogers Clayman, MD;  Location: [facility];  Service: ENT;  Laterality: Bilateral;   PROSTATE BIOPSY  2015   ROOT CANAL     WRIST  FRACTURE SURGERY Right      Current Outpatient Medications:    albuterol  (VENTOLIN  HFA) 108 (90 Base) MCG/ACT inhaler, Inhale 2 puffs into the lungs every 4 (four) hours as needed for wheezing or shortness of breath (coughing fits)., Disp: 8 g, Rfl: 0   fluticasone  (FLONASE ) 50 MCG/ACT nasal spray, Place 2 sprays into both nostrils daily., Disp: 16 g, Rfl: 0   levocetirizine (XYZAL ) 5 MG tablet, Take 1 tablet (5 mg total) by mouth every evening., Disp: 90 tablet, Rfl: 1   sildenafil  (VIAGRA ) 100 MG tablet, Take 1 tablet (100 mg total) by mouth daily as needed for erectile dysfunction. Take two hours prior to intercourse on an empty stomach, Disp: 90 tablet, Rfl: 3   tadalafil  (CIALIS ) 20 MG tablet, Take 1 tablet (20 mg total) by mouth daily as needed for erectile dysfunction., Disp: 90 tablet, Rfl: 3   tadalafil  (CIALIS ) 5 MG tablet, Take 1 tablet (5 mg total) by mouth daily as needed for erectile dysfunction., Disp: 90 tablet, Rfl: 3   testosterone  (ANDROGEL ) 50 MG/5GM (1%) GEL, APPLY CONTENTS OF 2 PACKETS TO CLEAN, DRY AND INTACT SKIN DAILY(AVOID FACE AND SCROTUM), Disp: 300 g, Rfl: 3   lisinopril  (ZESTRIL ) 10 MG tablet, TAKE 1 TABLET BY MOUTH DAILY, Disp: 90 tablet, Rfl: 1   metFORMIN  (GLUCOPHAGE ) 500 MG tablet, Take 2 tablets (1,000 mg total) by mouth 2 (two) times daily with a  meal., Disp: 360 tablet, Rfl: 1   omeprazole  (PRILOSEC) 20 MG capsule, Take 1 capsule (20 mg total) by mouth daily as needed (GERD/reflux)., Disp: 90 capsule, Rfl: 1   rosuvastatin  (CRESTOR ) 20 MG tablet, Take 1 tablet (20 mg total) by mouth daily., Disp: 90 tablet, Rfl: 1  Allergies  Allergen Reactions   Sulfa  Antibiotics Nausea Only   Doxycycline Other (See Comments) and Nausea And Vomiting    Patient Care Team: Ranon Coven, PA-C as PCP - General (Family Medicine) Lillard Reichmann, [place] (Physician Assistant) Chazan, Jennifer A, PA (Physician Assistant) Gerardine Knock, MD as Referring Physician (Urology)   Chart Review: I personally reviewed active problem list, medication list, allergies, family history, social history, health maintenance, notes from last encounter, lab results, imaging with the patient/caregiver today.   Review of Systems  Constitutional: Negative.   HENT: Negative.    Eyes: Negative.   Respiratory: Negative.    Cardiovascular: Negative.   Gastrointestinal: Negative.   Endocrine: Negative.   Genitourinary: Negative.   Musculoskeletal: Negative.   Skin: Negative.   Allergic/Immunologic: Negative.   Neurological: Negative.   Hematological: Negative.   Psychiatric/Behavioral: Negative.    All other systems reviewed and are negative.         Objective:   Vitals:  Vitals:   07/14/23 1420  BP: 132/84  Pulse: 89  Resp: 16  SpO2: 96%  Weight: 257 lb (116.6 kg)  Height: 5\' 11"  (1.803 m)    Body mass index is 35.84 kg/m.  Physical Exam Vitals and nursing note reviewed.  Constitutional:      General: He is not in acute distress.    Appearance: Normal appearance. He is well-developed. He is obese. He is not ill-appearing, toxic-appearing or diaphoretic.  HENT:     Head: Normocephalic and atraumatic.     Jaw: No trismus.     Right Ear: Tympanic membrane, ear canal and external ear normal.     Left Ear: Tympanic membrane, ear canal and  external ear normal.     Nose: No mucosal edema or  rhinorrhea.     Right Sinus: No maxillary sinus tenderness or frontal sinus tenderness.     Left Sinus: No maxillary sinus tenderness or frontal sinus tenderness.     Mouth/Throat:     Pharynx: Uvula midline. No oropharyngeal exudate, posterior oropharyngeal erythema or uvula swelling.  Eyes:     General: Lids are normal.     Conjunctiva/sclera: Conjunctivae normal.     Pupils: Pupils are equal, round, and reactive to light.  Neck:     Trachea: Trachea and phonation normal. No tracheal deviation.  Cardiovascular:     Rate and Rhythm: Normal rate and regular rhythm.     Pulses: Normal pulses.          Radial pulses are 2+ on the right side and 2+ on the left side.       Posterior tibial pulses are 2+ on the right side and 2+ on the left side.     Heart sounds: Normal heart sounds. No murmur heard.    No friction rub. No gallop.  Pulmonary:     Effort: Pulmonary effort is normal.     Breath sounds: Normal breath sounds. No wheezing, rhonchi or rales.  Abdominal:     General: Bowel sounds are normal. There is no distension.     Palpations: Abdomen is soft.     Tenderness: There is no abdominal tenderness. There is no guarding or rebound.  Musculoskeletal:     Cervical back: Normal range of motion and neck supple.  Skin:    General: Skin is warm and dry.     Capillary Refill: Capillary refill takes less than 2 seconds.     Findings: No rash.  Neurological:     Mental Status: He is alert. Mental status is at baseline.     Gait: Gait normal.  Psychiatric:        Speech: Speech normal.        Behavior: Behavior normal.        Fall Risk:    07/14/2023    2:16 PM 07/09/2022    9:04 AM 06/12/2022   10:18 AM 04/17/2022    2:04 PM 01/04/2022    8:47 AM  Fall Risk   Falls in the past year? 0 0 0 0 0  Number falls in past yr: 0 0 0 0 0  Injury with Fall? 0 0 0 0 0  Risk for fall due to :  No Fall Risks No Fall Risks  No Fall  Risks  Follow up  Falls prevention discussed;Education provided;Falls evaluation completed Falls prevention discussed;Education provided;Falls evaluation completed  Falls prevention discussed;Education provided;Falls evaluation completed    Functional Status Survey: Is the patient deaf or have difficulty hearing?: No Does the patient have difficulty seeing, even when wearing glasses/contacts?: No Does the patient have difficulty concentrating, remembering, or making decisions?: No Does the patient have difficulty walking or climbing stairs?: No Does the patient have difficulty dressing or bathing?: No Does the patient have difficulty doing errands alone such as visiting a doctor's office or shopping?: No   Assessment & Plan:    CPE completed today  Prostate cancer screening and PSA options (with potential risks and benefits of testing vs not testing) were discussed along with recent recs/guidelines, shared decision making and handout/information given to pt today  USPSTF grade A and B recommendations reviewed with patient; age-appropriate recommendations, preventive care, screening tests, etc discussed and encouraged; healthy living encouraged; see AVS for patient education given to patient  Discussed importance of 150 minutes of physical activity weekly, AHA exercise recommendations given to pt in AVS/handout  Discussed importance of healthy diet:  eating lean meats and proteins, avoiding trans fats and saturated fats, avoid simple sugars and excessive carbs in diet, eat 6 servings of fruit/vegetables daily and drink plenty of water  and avoid sweet beverages.  DASH diet reviewed if pt has HTN  Recommended pt to do annual eye exam and routine dental exams/cleanings  Advance Care planning information and packet discussed and offered today, encouraged pt to discuss with family members/spouse/partner/friends and complete Advanced directive packet and bring copy to office   Reviewed Health  Maintenance: Health Maintenance  Topic Date Due   Lung Cancer Screening  Never done   COVID-19 Vaccine (4 - 2024-25 season) 07/30/2023 (Originally 10/27/2022)   Zoster Vaccines- Shingrix (1 of 2) 10/14/2023 (Originally 12/25/1991)   INFLUENZA VACCINE  09/26/2023   DTaP/Tdap/Td (3 - Td or Tdap) 12/19/2026   Colonoscopy  01/26/2027   Pneumococcal Vaccine 85-29 Years old  Completed   Hepatitis C Screening  Completed   HIV Screening  Completed   HPV VACCINES  Aged Out   Meningococcal B Vaccine  Aged Out    Immunizations: Immunization History  Administered Date(s) Administered   Influenza, Quadrivalent, Recombinant, Inj, Pf 12/06/2018   Influenza,inj,Quad PF,6+ Mos 12/18/2016, 10/10/2017, 11/24/2021   Influenza,trivalent, recombinat, inj, PF 10/25/2022   Influenza-Unspecified 10/26/2013, 09/26/2015, 01/24/2020, 12/09/2021   Moderna Sars-Covid-2 Vaccination 05/05/2019, 06/02/2019, 01/24/2020   PNEUMOCOCCAL CONJUGATE-20 09/04/2020   Tdap 03/15/2010, 12/18/2016        ICD-10-CM   1. Well adult exam  Z00.00 Comprehensive metabolic panel with GFR    Hemoglobin A1c    Lipid panel    CBC with Differential/Platelet    LDL cholesterol, direct    TEST AUTHORIZATION    2. Need for shingles vaccine  Z23    encouraged to get at pharmacy or here in office    3. Current smoker  F17.200 Ambulatory Referral Lung Cancer Screening [facility]     HLD on crestor  changed last year and he tolerates this better GI sx     Adeline Hone, PA-C 07/14/23 2:38 PM  [facility]

## 2023-07-15 ENCOUNTER — Ambulatory Visit: Payer: Self-pay | Admitting: Family Medicine

## 2023-07-15 DIAGNOSIS — R7303 Prediabetes: Secondary | ICD-10-CM

## 2023-07-15 DIAGNOSIS — E8881 Metabolic syndrome: Secondary | ICD-10-CM

## 2023-07-15 DIAGNOSIS — K219 Gastro-esophageal reflux disease without esophagitis: Secondary | ICD-10-CM

## 2023-07-15 DIAGNOSIS — E782 Mixed hyperlipidemia: Secondary | ICD-10-CM

## 2023-07-15 DIAGNOSIS — E781 Pure hyperglyceridemia: Secondary | ICD-10-CM

## 2023-07-15 DIAGNOSIS — I10 Essential (primary) hypertension: Secondary | ICD-10-CM

## 2023-07-15 DIAGNOSIS — E88819 Insulin resistance, unspecified: Secondary | ICD-10-CM

## 2023-07-15 MED ORDER — OMEPRAZOLE 20 MG PO CPDR: 20.0000 mg | DELAYED_RELEASE_CAPSULE | Freq: Every day | ORAL | 1 refills | Status: AC | PRN

## 2023-07-15 MED ORDER — METFORMIN HCL 500 MG PO TABS: 1000.0000 mg | ORAL_TABLET | Freq: Two times a day (BID) | ORAL | 1 refills | Status: DC

## 2023-07-15 MED ORDER — ROSUVASTATIN CALCIUM 20 MG PO TABS: 20.0000 mg | ORAL_TABLET | Freq: Every day | ORAL | 1 refills | Status: DC

## 2023-07-15 MED ORDER — LISINOPRIL 10 MG PO TABS: ORAL_TABLET | ORAL | 1 refills | Status: DC

## 2023-07-17 ENCOUNTER — Other Ambulatory Visit: Payer: Self-pay

## 2023-07-17 DIAGNOSIS — E782 Mixed hyperlipidemia: Secondary | ICD-10-CM

## 2023-07-17 DIAGNOSIS — E781 Pure hyperglyceridemia: Secondary | ICD-10-CM

## 2023-07-17 LAB — TEST AUTHORIZATION

## 2023-07-17 LAB — COMPREHENSIVE METABOLIC PANEL WITH GFR
AG Ratio: 1.7 (calc) (ref 1.0–2.5)
ALT: 15 U/L (ref 9–46)
AST: 28 U/L (ref 10–35)
Albumin: 4.5 g/dL (ref 3.6–5.1)
Alkaline phosphatase (APISO): 71 U/L (ref 35–144)
BUN: 21 mg/dL (ref 7–25)
CO2: 27 mmol/L (ref 20–32)
Calcium: 9.6 mg/dL (ref 8.6–10.3)
Chloride: 102 mmol/L (ref 98–110)
Creat: 1.25 mg/dL (ref 0.70–1.30)
Globulin: 2.6 g/dL (ref 1.9–3.7)
Glucose, Bld: 73 mg/dL (ref 65–99)
Potassium: 4.1 mmol/L (ref 3.5–5.3)
Sodium: 137 mmol/L (ref 135–146)
Total Bilirubin: 0.2 mg/dL (ref 0.2–1.2)
Total Protein: 7.1 g/dL (ref 6.1–8.1)
eGFR: 70 mL/min/{1.73_m2} (ref >=60)

## 2023-07-17 LAB — LDL CHOLESTEROL, DIRECT: Direct LDL: 105 mg/dL — ABNORMAL HIGH (ref <100)

## 2023-07-17 LAB — CBC WITH DIFFERENTIAL/PLATELET
Absolute Lymphocytes: 3485 {cells}/uL (ref 850–3900)
Absolute Monocytes: 644 {cells}/uL (ref 200–950)
Basophils Absolute: 78 {cells}/uL (ref 0–200)
Basophils Relative: 0.7 %
Eosinophils Absolute: 89 {cells}/uL (ref 15–500)
Eosinophils Relative: 0.8 %
HCT: 45.7 % (ref 38.5–50.0)
Hemoglobin: 15.4 g/dL (ref 13.2–17.1)
MCH: 29.7 pg (ref 27.0–33.0)
MCHC: 33.7 g/dL (ref 32.0–36.0)
MCV: 88.1 fL (ref 80.0–100.0)
MPV: 10.3 fL (ref 7.5–12.5)
Monocytes Relative: 5.8 %
Neutro Abs: 6804 {cells}/uL (ref 1500–7800)
Neutrophils Relative %: 61.3 %
Platelets: 251 10*3/uL (ref 140–400)
RBC: 5.19 10*6/uL (ref 4.20–5.80)
RDW: 13.4 % (ref 11.0–15.0)
Total Lymphocyte: 31.4 %
WBC: 11.1 10*3/uL — ABNORMAL HIGH (ref 3.8–10.8)

## 2023-07-17 LAB — LIPID PANEL
Cholesterol: 252 mg/dL — ABNORMAL HIGH (ref <200)
HDL: 28 mg/dL — ABNORMAL LOW (ref >=40)
Non-HDL Cholesterol (Calc): 224 mg/dL — ABNORMAL HIGH (ref <130)
Total CHOL/HDL Ratio: 9 (calc) — ABNORMAL HIGH (ref <5.0)
Triglycerides: 1050 mg/dL — ABNORMAL HIGH (ref <150)

## 2023-07-17 LAB — HEMOGLOBIN A1C
Hgb A1c MFr Bld: 5.6 % (ref <5.7)
Mean Plasma Glucose: 114 mg/dL
eAG (mmol/L): 6.3 mmol/L

## 2023-07-21 ENCOUNTER — Other Ambulatory Visit: Payer: Self-pay | Admitting: Family Medicine

## 2023-07-21 DIAGNOSIS — R0602 Shortness of breath: Secondary | ICD-10-CM

## 2023-07-22 ENCOUNTER — Other Ambulatory Visit: Payer: Self-pay

## 2023-07-22 ENCOUNTER — Telehealth: Payer: Self-pay | Admitting: Acute Care

## 2023-07-22 DIAGNOSIS — Z87891 Personal history of nicotine dependence: Secondary | ICD-10-CM

## 2023-07-22 DIAGNOSIS — Z122 Encounter for screening for malignant neoplasm of respiratory organs: Secondary | ICD-10-CM

## 2023-07-22 NOTE — Telephone Encounter (Signed)
 Lung Cancer Screening Narrative/Criteria Questionnaire (Cigarette Smokers Only- No Cigars/Pipes/vapes)   Jeffery [name]   SDMV:08/07/23 at 0900a/Natalie                                           1970/11/24                LDCT: 08/11/23 at 0800a/[facility]    52 y.o.   Phone: [phone]  Lung Screening Narrative (confirm age 30-77 yrs Medicare / 50-80 yrs Private pay insurance)   Insurance information:BCBS   Referring Provider:[facility]   This screening involves an initial phone call with a team member from our program. It is called a shared decision making visit. The initial meeting is required by insurance and Medicare to make sure you understand the program. This appointment takes about 15-20 minutes to complete. The CT scan will completed at a separate date/time. This scan takes about 5-10 minutes to complete and you may eat and drink before and after the scan.  Criteria questions for Lung Cancer Screening:   Are you a current or former smoker? Former Age began smoking: 16y (*now vapes)   If you are a former smoker, what year did you quit smoking? 2022   To calculate your smoking history, I need an accurate estimate of how many packs of cigarettes you smoked per day and for how many years. (Not just the number of PPD you are now smoking)   Years smoking 33 x Packs per day 2 = Pack years 33   (at least 20 pack yrs)   (Make sure they understand that we need to know how much they have smoked in the past, not just the number of PPD they are smoking now)  Do you have a personal history of cancer?  No    Do you have a family history of cancer? Yes  (cancer type and and relative) mother/colon&breast with mets to lungs  Are you coughing up blood?  No  Have you had unexplained weight loss of 15 lbs or more in the last 6 months? No  It looks like you meet all criteria.     Additional information: N/A

## 2023-07-24 NOTE — Telephone Encounter (Signed)
 Too soon for refill.  Requested Prescriptions  Pending Prescriptions Disp Refills   albuterol  (VENTOLIN  HFA) 108 (90 Base) MCG/ACT inhaler [Pharmacy Med Name: ALBUTEROL  HFA INH (200 PUFFS) 8.5GM] 8.5 g     Sig: INHALE 2 PUFFS BY MOUTH AND INTO THE LUNGS EVERY 4 HOURS AS NEEDED FOR WHEEZING OR SHORTNESS OF BREATH(COUGHING FITS)     Pulmonology:  Beta Agonists 2 Passed - 07/24/2023  8:44 AM      Passed - Last BP in normal range    BP Readings from Last 1 Encounters:  07/14/23 132/84         Passed - Last Heart Rate in normal range    Pulse Readings from Last 1 Encounters:  07/14/23 89         Passed - Valid encounter within last 12 months    Recent Outpatient Visits           1 week ago Well adult exam   [facility] Adeline Hone, PA-C   2 weeks ago Upper respiratory tract infection, unspecified type   [facility] Adeline Hone, PA-C       Future Appointments             In 4 months [place], Nyra Bellis [facility] [place]   In 11 months Adeline Hone, PA-C [facility]

## 2023-08-03 DIAGNOSIS — G4733 Obstructive sleep apnea (adult) (pediatric): Secondary | ICD-10-CM | POA: Diagnosis not present

## 2023-08-07 ENCOUNTER — Encounter: Payer: Self-pay | Admitting: *Deleted

## 2023-08-07 ENCOUNTER — Ambulatory Visit: Admitting: *Deleted

## 2023-08-07 DIAGNOSIS — Z87891 Personal history of nicotine dependence: Secondary | ICD-10-CM

## 2023-08-07 NOTE — Patient Instructions (Signed)

## 2023-08-07 NOTE — Progress Notes (Signed)
  Virtual Visit via Telephone Note  I connected with Jeffery [name] on 08/07/23 at  9:00 AM EDT by telephone and verified that I am speaking with the correct person using two identifiers.  Location: Patient: Jeffery [name]. [place] Provider: Alyse Bach, RN   I discussed the limitations, risks, security and privacy concerns of performing an evaluation and management service by telephone and the availability of in person appointments. I also discussed with the patient that there may be a patient responsible charge related to this service. The patient expressed understanding and agreed to proceed.    Shared Decision Making Visit Lung Cancer Screening Program [phone])   Eligibility: Age 53 y.o. Pack Years Smoking History Calculation 30 (# packs/per year x # years smoked) Recent History of coughing up blood  no Unexplained weight loss? no ( >Than 15 pounds within the last 6 months ) Prior History Lung / other cancer no (Diagnosis within the last 5 years already requiring surveillance chest CT Scans). Smoking Status Former Smoker Former Smokers: Years since quit: 3 years  Quit Date: 2022  Visit Components: Discussion included one or more decision making aids. yes Discussion included risk/benefits of screening. yes Discussion included potential follow up diagnostic testing for abnormal scans. yes Discussion included meaning and risk of over diagnosis. yes Discussion included meaning and risk of False Positives. yes Discussion included meaning of total radiation exposure. yes  Counseling Included: Importance of adherence to annual lung cancer LDCT screening. yes Impact of comorbidities on ability to participate in the program. yes Ability and willingness to under diagnostic treatment. yes  Smoking Cessation Counseling: Current Smokers:  Discussed importance of smoking cessation. yes Information about tobacco cessation classes and interventions provided to patient. yes Patient  provided with ticket for LDCT Scan. no Symptomatic Patient. no  Counseling(Intermediate counseling: > three minutes) 99406 Diagnosis Code: Tobacco Use Z72.0 Asymptomatic Patient yes  Counseling (Intermediate counseling: > three minutes counseling) [id number] Former Smokers:  Discussed the importance of maintaining cigarette abstinence. yes Diagnosis Code: Personal History of Nicotine Dependence. X91.478 Information about tobacco cessation classes and interventions provided to patient. Yes Patient provided with ticket for LDCT Scan. no Written Order for Lung Cancer Screening with LDCT placed in Epic. Yes (CT Chest Lung Cancer Screening Low Dose W/O CM) [id number] Z12.2-Screening of respiratory organs Z87.891-Personal history of nicotine dependence   Alyse Bach, RN

## 2023-08-11 ENCOUNTER — Ambulatory Visit
Admission: RE | Admit: 2023-08-11 | Discharge: 2023-08-11 | Disposition: A | Discharge status: Home / Self Care | Source: Ambulatory Visit | Attending: Acute Care | Admitting: Acute Care

## 2023-08-11 DIAGNOSIS — Z122 Encounter for screening for malignant neoplasm of respiratory organs: Secondary | ICD-10-CM | POA: Insufficient documentation

## 2023-08-11 DIAGNOSIS — Z87891 Personal history of nicotine dependence: Secondary | ICD-10-CM | POA: Diagnosis not present

## 2023-08-14 ENCOUNTER — Ambulatory Visit
Admission: RE | Admit: 2023-08-14 | Discharge: 2023-08-14 | Disposition: A | Discharge status: Home / Self Care | Payer: Self-pay | Source: Ambulatory Visit | Attending: Family Medicine

## 2023-08-14 VITALS — BP 134/89 | HR 87 | Temp 98.3°F | Resp 18

## 2023-08-14 DIAGNOSIS — Z87438 Personal history of other diseases of male genital organs: Secondary | ICD-10-CM | POA: Diagnosis not present

## 2023-08-14 DIAGNOSIS — R300 Dysuria: Secondary | ICD-10-CM | POA: Diagnosis not present

## 2023-08-14 LAB — POCT URINALYSIS DIP (MANUAL ENTRY)
Bilirubin, UA: NEGATIVE
Blood, UA: NEGATIVE
Glucose, UA: NEGATIVE mg/dL
Ketones, POC UA: NEGATIVE mg/dL
Leukocytes, UA: NEGATIVE
Nitrite, UA: NEGATIVE
Protein Ur, POC: NEGATIVE mg/dL
Spec Grav, UA: 1.02 (ref 1.010–1.025)
Urobilinogen, UA: 0.2 U/dL
pH, UA: 6.5 (ref 5.0–8.0)

## 2023-08-14 MED ORDER — LEVOFLOXACIN 500 MG PO TABS: 500.0000 mg | ORAL_TABLET | Freq: Every day | ORAL | 0 refills | Status: AC

## 2023-08-14 NOTE — Discharge Instructions (Signed)
 We are treating you today for prostatitis.  Take the Levaquin  as prescribed.  If symptoms do not improve with treatment, please seek care with Urologist.

## 2023-08-14 NOTE — ED Triage Notes (Signed)
 Pt reports UTI sx's urgency, burning with urination x 4 days

## 2023-08-14 NOTE — ED Provider Notes (Signed)
 [facility]    CSN: [id number] Arrival date & time: 08/14/23  0817      History   Chief Complaint Chief Complaint  Patient presents with   Urinary Frequency    Possibly a UTI - Entered by patient    HPI Jeffery [name] is a 53 y.o. male.   Patient presents today with 4 day history of dysuria, urinary frequency and urgency, voiding smaller amounts, worsening urinary incontinence, foul urinary odor.  He denies abdominal pain, hematuria, flank pain, fever, nausea/vomiting, and penile discharge.  Reports he is in a monogamous relationship, no concern for STI and no new sexual partners.  Reports history of BPH and prostatitis, had surgery years ago for enlarged prostate that helped with recurrent prostatitis and has not had any episodes since the surgery.  Reports he was told the surgery would last 10 years.  He is taking medications for BPH as prescribed and follows with urology regularly.    Past Medical History:  Diagnosis Date   Biceps tendon rupture, right, subsequent encounter 03/20/2017   Dental crowns present    sides and back   Dyslipidemia    Enlarged prostate    Epididymitis    Headache    sinus   Hypotestosteronism    Lower urinary tract symptoms (LUTS) 06/14/2016   Nodular prostate with lower urinary tract symptoms    Obesity    Sleep apnea 07/2013   Testosterone  deficiency    Umbilical hernia    Umbilical hernia without obstruction or gangrene     Patient Active Problem List   Diagnosis Date Noted   Prediabetes 01/29/2022   History of colonic polyps 01/25/2022   Adenomatous polyp of colon 01/25/2022   Hypertriglyceridemia 09/06/2020   Family history of colon cancer in mother 08/12/2019   Obstructive sleep apnea syndrome 08/12/2019   Current smoker 08/12/2019   Insulin  resistance 08/17/2018   Metabolic syndrome 08/17/2018   Gastroesophageal reflux disease without esophagitis 03/17/2018   Other chest pain 03/16/2018   Mixed hyperlipidemia  03/16/2018   Essential hypertension 12/22/2017   Hypogonadism male 09/19/2016   Family history of type 2 diabetes mellitus in mother 04/05/2016   Benign prostatic hyperplasia with urinary obstruction 03/10/2015   Dyslipidemia 03/10/2015   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   Opioid type dependence, abuse (HCC) 03/10/2015   Class 2 severe obesity with serious comorbidity and body mass index (BMI) of 35.0 to 35.9 in adult [facility]) 03/10/2015   History of nasal septoplasty 11/23/2014   Chronic prostatitis 10/21/2014   Umbilical hernia 08/06/2013    Past Surgical History:  Procedure Laterality Date   COLONOSCOPY WITH PROPOFOL  N/A 01/25/2022   Procedure: COLONOSCOPY WITH PROPOFOL ;  Surgeon: Luke Salaam, MD;  Location: [facility] ENDOSCOPY;  Service: Gastroenterology;  Laterality: N/A;   FRACTURE SURGERY  06/1997   HERNIA REPAIR  03/11/2014   umbilical hernia   NASAL SEPTOPLASTY W/ TURBINOPLASTY Bilateral 11/23/2014   Procedure: NASAL SEPTOPLASTY WITH TURBINATE REDUCTION;  Surgeon: Rogers Clayman, MD;  Location: [facility];  Service: ENT;  Laterality: Bilateral;   PROSTATE BIOPSY  2015   ROOT CANAL     WRIST FRACTURE SURGERY Right        Home Medications    Prior to Admission medications   Medication Sig Start Date End Date Taking? Authorizing Provider  levofloxacin  (LEVAQUIN ) 500 MG tablet Take 1 tablet (500 mg total) by mouth daily for 28 days. 08/14/23 09/11/23 Yes Wilhemena Harbour, NP  albuterol  (VENTOLIN  HFA) 108 (90  Base) MCG/ACT inhaler Inhale 2 puffs into the lungs every 4 (four) hours as needed for wheezing or shortness of breath (coughing fits). 07/08/23   Tapia, Leisa, PA-C  fluticasone  (FLONASE ) 50 MCG/ACT nasal spray Place 2 sprays into both nostrils daily. 07/04/23   Leath-Warren, Belen Bowers, NP  levocetirizine (XYZAL ) 5 MG tablet Take 1 tablet (5 mg total) by mouth every evening. 07/08/23   Tapia, Leisa, PA-C  lisinopril  (ZESTRIL ) 10 MG tablet TAKE 1 TABLET BY MOUTH  DAILY 07/15/23   Tapia, Leisa, PA-C  metFORMIN  (GLUCOPHAGE ) 500 MG tablet Take 2 tablets (1,000 mg total) by mouth 2 (two) times daily with a meal. 07/15/23   Adeline Hone, PA-C  omeprazole  (PRILOSEC) 20 MG capsule Take 1 capsule (20 mg total) by mouth daily as needed (GERD/reflux). 07/15/23   Tapia, Leisa, PA-C  rosuvastatin  (CRESTOR ) 20 MG tablet Take 1 tablet (20 mg total) by mouth daily. 07/15/23   Tapia, Leisa, PA-C  sildenafil  (VIAGRA ) 100 MG tablet Take 1 tablet (100 mg total) by mouth daily as needed for erectile dysfunction. Take two hours prior to intercourse on an empty stomach 11/11/22   McGowan, Cathleen Coach A, PA-C  tadalafil  (CIALIS ) 20 MG tablet Take 1 tablet (20 mg total) by mouth daily as needed for erectile dysfunction. 11/11/22   Matilde Son A, PA-C  tadalafil  (CIALIS ) 5 MG tablet Take 1 tablet (5 mg total) by mouth daily as needed for erectile dysfunction. 11/11/22   McGowan, Cathleen Coach A, PA-C  testosterone  (ANDROGEL ) 50 MG/5GM (1%) GEL APPLY CONTENTS OF 2 PACKETS TO CLEAN, DRY AND INTACT SKIN DAILY(AVOID FACE AND SCROTUM) 01/14/23   McGowan, Danne Dustman, PA-C    Family History Family History  Problem Relation Age of Onset   Stroke Mother    Diabetes Mother    Cancer Mother        Colon Cancer   Alzheimer's disease Father    Hypertension Father    Cancer Maternal Grandmother        Lung-  Non-Smoker    Social History Social History   Tobacco Use   Smoking status: Former    Current packs/day: 0.00    Average packs/day: 1 pack/day for 30.0 years (30.0 ttl pk-yrs)    Types: Cigarettes, E-cigarettes    Start date: 02/25/1990    Quit date: 02/26/2020    Years since quitting: 3.4   Smokeless tobacco: Former   Tobacco comments:    Former cigarette smoker 25-30 years 1 ppd total 25-30 packyear hx quit in 2022    Past dip very occasionally like when hunting  Vaping Use   Vaping status: Every Day   Substances: Nicotine  Substance Use Topics   Alcohol use: No   Drug use: No      Allergies   Sulfa  antibiotics and Doxycycline   Review of Systems Review of Systems Per HPI  Physical Exam Triage Vital Signs ED Triage Vitals  Encounter Vitals Group     BP 08/14/23 0901 134/89     Girls Systolic BP Percentile --      Girls Diastolic BP Percentile --      Boys Systolic BP Percentile --      Boys Diastolic BP Percentile --      Pulse Rate 08/14/23 0901 87     Resp 08/14/23 0901 18     Temp 08/14/23 0901 98.3 F (36.8 C)     Temp Source 08/14/23 0901 Oral     SpO2 08/14/23 0901 95 %  Weight --      Height --      Head Circumference --      Peak Flow --      Pain Score 08/14/23 0902 0     Pain Loc --      Pain Education --      Exclude from Growth Chart --    No data found.  Updated Vital Signs BP 134/89 (BP Location: Right Arm)   Pulse 87   Temp 98.3 F (36.8 C) (Oral)   Resp 18   SpO2 95%   Visual Acuity Right Eye Distance:   Left Eye Distance:   Bilateral Distance:    Right Eye Near:   Left Eye Near:    Bilateral Near:     Physical Exam Vitals and nursing note reviewed.  Constitutional:      General: He is not in acute distress.    Appearance: Normal appearance. He is not toxic-appearing.  HENT:     Mouth/Throat:     Mouth: Mucous membranes are moist.     Pharynx: Oropharynx is clear.   Cardiovascular:     Rate and Rhythm: Normal rate and regular rhythm.  Pulmonary:     Effort: Pulmonary effort is normal. No respiratory distress.     Breath sounds: Normal breath sounds. No wheezing, rhonchi or rales.  Abdominal:     General: Abdomen is flat. Bowel sounds are normal. There is no distension.     Palpations: Abdomen is soft.     Tenderness: There is no abdominal tenderness. There is no right CVA tenderness, left CVA tenderness or guarding.   Skin:    General: Skin is warm and dry.     Coloration: Skin is not jaundiced or pale.     Findings: No erythema.   Neurological:     Mental Status: He is alert and oriented to  person, place, and time.   Psychiatric:        Behavior: Behavior is cooperative.      UC Treatments / Results  Labs (all labs ordered are listed, but only abnormal results are displayed) Labs Reviewed  URINE CULTURE  POCT URINALYSIS DIP (MANUAL ENTRY)    EKG   Radiology No results found.  Procedures Procedures (including critical care time)  Medications Ordered in UC Medications - No data to display  Initial Impression / Assessment and Plan / UC Course  I have reviewed the triage vital signs and the nursing notes.  Pertinent labs & imaging results that were available during my care of the patient were reviewed by me and considered in my medical decision making (see chart for details).   Patient is well-appearing, normotensive, afebrile, not tachycardic, not tachypneic, oxygenating well on room air.   1. Dysuria 2. History of prostatitis Urinalysis today is without signs infection Urine culture is pending Given history of prostatitis with similar symptoms, we will go ahead and treat with Levaquin  500 mg daily Recommended close follow-up with urology if symptoms are not improving  The patient was given the opportunity to ask questions.  All questions answered to their satisfaction.  The patient is in agreement to this plan.   Final Clinical Impressions(s) / UC Diagnoses   Final diagnoses:  Dysuria  History of prostatitis     Discharge Instructions      We are treating you today for prostatitis.  Take the Levaquin  as prescribed.  If symptoms do not improve with treatment, please seek care with Urologist.  ED Prescriptions     Medication Sig Dispense Auth. Provider   levofloxacin  (LEVAQUIN ) 500 MG tablet Take 1 tablet (500 mg total) by mouth daily for 28 days. 28 tablet Wilhemena Harbour, NP      PDMP not reviewed this encounter.   Wilhemena Harbour, NP 08/14/23 1216

## 2023-08-15 LAB — URINE CULTURE: Culture: NO GROWTH

## 2023-08-18 ENCOUNTER — Ambulatory Visit (HOSPITAL_COMMUNITY): Payer: Self-pay

## 2023-08-25 ENCOUNTER — Other Ambulatory Visit: Payer: Self-pay | Admitting: Acute Care

## 2023-08-25 DIAGNOSIS — Z87891 Personal history of nicotine dependence: Secondary | ICD-10-CM

## 2023-08-25 DIAGNOSIS — Z122 Encounter for screening for malignant neoplasm of respiratory organs: Secondary | ICD-10-CM

## 2023-08-26 ENCOUNTER — Encounter: Payer: Self-pay | Admitting: Internal Medicine

## 2023-08-26 ENCOUNTER — Ambulatory Visit (INDEPENDENT_AMBULATORY_CARE_PROVIDER_SITE_OTHER): Admitting: Internal Medicine

## 2023-08-26 VITALS — BP 118/90 | HR 84 | Temp 98.5°F | Ht 71.0 in | Wt 258.4 lb

## 2023-08-26 DIAGNOSIS — F17210 Nicotine dependence, cigarettes, uncomplicated: Secondary | ICD-10-CM | POA: Diagnosis not present

## 2023-08-26 DIAGNOSIS — R0602 Shortness of breath: Secondary | ICD-10-CM | POA: Diagnosis not present

## 2023-08-26 DIAGNOSIS — R5381 Other malaise: Secondary | ICD-10-CM

## 2023-08-26 DIAGNOSIS — G4733 Obstructive sleep apnea (adult) (pediatric): Secondary | ICD-10-CM

## 2023-08-26 DIAGNOSIS — Z87891 Personal history of nicotine dependence: Secondary | ICD-10-CM

## 2023-08-26 DIAGNOSIS — Z6836 Body mass index (BMI) 36.0-36.9, adult: Secondary | ICD-10-CM

## 2023-08-26 LAB — NITRIC OXIDE: Nitric Oxide: 8

## 2023-08-26 NOTE — Patient Instructions (Addendum)
 Please Smoking and Vaping We talked about the risks of what it does to your body  Lets plan to obtain pulmonary function testing to assess your breathing function  We reviewed your CT of your chest Please follow-up annually for your lung cancer screening protocol\  Please send us  your serial number of your CPAP machine so that we may review your download  Avoid Allergens and Irritants Avoid secondhand smoke Avoid SICK contacts Recommend  Masking  when appropriate Recommend Keep up-to-date with vaccinations   Patient Instructions  Continue to use CPAP every night, minimum of 4-6 hours a night.  Change equipment every 30 days or as directed by DME.  Wash your tubing with warm soap and water  daily, hang to dry. Wash humidifier portion weekly. Use bottled, distilled water  and change daily   Be aware of reduced alertness and do not drive or operate heavy machinery if experiencing this or drowsiness.  Exercise encouraged, as tolerated. Encouraged proper weight management.  Important to get eight or more hours of sleep  Limiting the use of the computer and television before bedtime.  Decrease naps during the day, so night time sleep will become enhanced.  Limit caffeine, and sleep deprivation.  HTN, stroke, uncontrolled diabetes and heart failure are potential risk factors.  Risk of untreated sleep apnea including cardiac arrhthymias, stroke

## 2023-08-26 NOTE — Progress Notes (Signed)
 [facility]monary Medicine Consultation      Date: 08/26/2023,   MRN# [id number] Jeffery [name] [birth year]   CHIEF COMPLAINT:   Assessment of shortness of breath Assessment for underlying sleep apnea Assessment of low-dose CT lung cancer screening program   HISTORY OF PRESENT ILLNESS   ASSESSMENT OF SOB Patient with signs and symptoms of upper respiratory tract infection several months ago Patient was prescribed Z-Pak Patient had some shortness of breath and [place] exertion along with cough nasal congestion At this time symptoms have resolved  No exacerbation at this time No evidence of heart failure at this time No evidence or signs of infection at this time No respiratory distress No fevers, chills, nausea, vomiting, diarrhea No evidence of lower extremity edema No evidence hemoptysis  Ambulating pulse oximetry in the office today did not reveal hypoxia, lowest O2 sat was 91%   Assessment of ASTHMA FeNO 8   ppb-Elevated exhaled Nitric oxide testing is NOT consistent with type II inflammation  Patient with sleep related to excessive daytime sleepiness Patient  has been having sleep problems for many years Patient has been having excessive daytime sleepiness for a long time Patient has been having extreme fatigue and tiredness, lack of energy  Discussed sleep data and reviewed with patient.  Encouraged proper weight management.  Discussed driving precautions and its relationship with hypersomnolence.  Discussed operating dangerous equipment and its relationship with hypersomnolence.  Discussed sleep hygiene, and benefits of a fixed sleep waked time.  The importance of getting eight or more hours of sleep discussed with patient.  Discussed limiting the use of the computer and television before bedtime.  Decrease naps during the day, so night time sleep will become enhanced.  Limit caffeine, and sleep deprivation.  HTN, stroke, and heart failure are potential  risk factors.   Discussed risk of untreated sleep apnea including cardiac arrhthymias, stroke, DM, pulm HTN.   Patient does have a diagnosis of sleep apnea undergoing CPAP therapy at this time 2016 sleep study showed AHI of 30 Titration study showed resolution of AHI with CPAP of 14   Assessment of low-dose CT chest June 2025 Minimal linear scarring in the lingula however no findings consistent with nodules or lung masses Benign in appearance No pneumonia no edema No effusions  Patient is a smoker 1 pack a day for the last 30 years  quit 3 years ago Patient currently does vape Smoking cessation and vaping cessation strongly advised       PAST MEDICAL HISTORY   Past Medical History:  Diagnosis Date   Biceps tendon rupture, right, subsequent encounter 03/20/2017   Dental crowns present    sides and back   Dyslipidemia    Enlarged prostate    Epididymitis    Headache    sinus   Hypotestosteronism    Lower urinary tract symptoms (LUTS) 06/14/2016   Nodular prostate with lower urinary tract symptoms    Obesity    Sleep apnea 07/2013   Testosterone  deficiency    Umbilical hernia    Umbilical hernia without obstruction or gangrene      SURGICAL HISTORY   Past Surgical History:  Procedure Laterality Date   COLONOSCOPY WITH PROPOFOL  N/A 01/25/2022   Procedure: COLONOSCOPY WITH PROPOFOL ;  Surgeon: Therisa Bi, MD;  Location: [facility] ENDOSCOPY;  Service: Gastroenterology;  Laterality: N/A;   FRACTURE SURGERY  06/1997   HERNIA REPAIR  03/11/2014   umbilical hernia   NASAL SEPTOPLASTY W/ TURBINOPLASTY Bilateral 11/23/2014  Procedure: NASAL SEPTOPLASTY WITH TURBINATE REDUCTION;  Surgeon: Carolee Hunter, MD;  Location: [facility];  Service: ENT;  Laterality: Bilateral;   PROSTATE BIOPSY  2015   ROOT CANAL     WRIST FRACTURE SURGERY Right      FAMILY HISTORY   Family History  Problem Relation Age of Onset   Stroke Mother    Diabetes Mother    Cancer  Mother        Colon Cancer   Alzheimer's disease Father    Hypertension Father    Cancer Maternal Grandmother        Lung-  Non-Smoker     SOCIAL HISTORY   Social History   Tobacco Use   Smoking status: Former    Current packs/day: 0.00    Average packs/day: 1 pack/day for 30.0 years (30.0 ttl pk-yrs)    Types: Cigarettes, E-cigarettes    Start date: 02/25/1990    Quit date: 02/26/2020    Years since quitting: 3.4   Smokeless tobacco: Former   Tobacco comments:    Former cigarette smoker 25-30 years 1 ppd total 25-30 packyear hx quit in 2022    Past dip very occasionally like when hunting  Vaping Use   Vaping status: Every Day   Substances: Nicotine  Substance Use Topics   Alcohol use: No   Drug use: No     MEDICATIONS    Home Medication:  Current Outpatient Rx   Order #: [id number] Class: Normal   Order #: [id number] Class: Normal   Order #: [id number] Class: Normal   Order #: [id number] Class: Normal   Order #: [id number] Class: Normal   Order #: [id number] Class: Normal   Order #: [id number] Class: Normal   Order #: [id number] Class: Normal   Order #: [id number] Class: Normal   Order #: [id number] Class: Normal   Order #: [id number] Class: Normal   Order #: [id number] Class: Normal    Current Medication:  Current Outpatient Medications:    albuterol  (VENTOLIN  HFA) 108 (90 Base) MCG/ACT inhaler, Inhale 2 puffs into the lungs every 4 (four) hours as needed for wheezing or shortness of breath (coughing fits)., Disp: 8 g, Rfl: 0   fluticasone  (FLONASE ) 50 MCG/ACT nasal spray, Place 2 sprays into both nostrils daily., Disp: 16 g, Rfl: 0   levocetirizine (XYZAL ) 5 MG tablet, Take 1 tablet (5 mg total) by mouth every evening., Disp: 90 tablet, Rfl: 1   levofloxacin  (LEVAQUIN ) 500 MG tablet, Take 1 tablet (500 mg total) by mouth daily for 28 days., Disp: 28 tablet, Rfl: 0   lisinopril  (ZESTRIL ) 10 MG tablet, TAKE 1 TABLET BY MOUTH DAILY, Disp: 90 tablet, Rfl: 1   metFORMIN  (GLUCOPHAGE ) 500  MG tablet, Take 2 tablets (1,000 mg total) by mouth 2 (two) times daily with a meal., Disp: 360 tablet, Rfl: 1   omeprazole  (PRILOSEC) 20 MG capsule, Take 1 capsule (20 mg total) by mouth daily as needed (GERD/reflux)., Disp: 90 capsule, Rfl: 1   rosuvastatin  (CRESTOR ) 20 MG tablet, Take 1 tablet (20 mg total) by mouth daily., Disp: 90 tablet, Rfl: 1   sildenafil  (VIAGRA ) 100 MG tablet, Take 1 tablet (100 mg total) by mouth daily as needed for erectile dysfunction. Take two hours prior to intercourse on an empty stomach, Disp: 90 tablet, Rfl: 3   tadalafil  (CIALIS ) 20 MG tablet, Take 1 tablet (20 mg total) by mouth daily as needed for erectile dysfunction., Disp: 90 tablet, Rfl: 3   tadalafil  (CIALIS ) 5 MG tablet, Take 1 tablet (5 mg total) by mouth daily as  needed for erectile dysfunction., Disp: 90 tablet, Rfl: 3   testosterone  (ANDROGEL ) 50 MG/5GM (1%) GEL, APPLY CONTENTS OF 2 PACKETS TO CLEAN, DRY AND INTACT SKIN DAILY(AVOID FACE AND SCROTUM), Disp: 300 g, Rfl: 3    ALLERGIES   Sulfa  antibiotics and Doxycycline  BP (!) 118/90 (BP Location: Right Arm, Patient Position: Sitting, Cuff Size: Large)   Pulse 84   Temp 98.5 F (36.9 C) (Oral)   Ht 5' 11 (1.803 m)   Wt 258 lb 6.4 oz (117.2 kg)   SpO2 93%   BMI 36.04 kg/m    Review of Systems: Gen:  Denies  fever, sweats, chills weight loss  HEENT: Denies blurred vision, double vision, ear pain, eye pain, hearing loss, nose bleeds, sore throat Cardiac:  No dizziness, chest pain or heaviness, chest tightness,edema, No JVD Resp:   No cough, -sputum production, +shortness of breath,-wheezing, -hemoptysis,  Other:  All other systems negative   Physical Examination:   General Appearance: No distress  EYES PERRLA, EOM intact.   NECK Supple, No JVD Pulmonary: normal breath sounds, No wheezing.  CardiovascularNormal S1,S2.  No m/r/g.   Abdomen: Benign, Soft, non-tender. Neurology UE/LE 5/5 strength, no focal deficits Ext pulses intact,  cap refill intact ALL OTHER ROS ARE NEGATIVE    CBC    Component Value Date/Time   WBC 11.1 (H) 07/14/2023 1509   RBC 5.19 07/14/2023 1509   HGB 15.4 07/14/2023 1509   HGB 15.2 05/08/2023 1536   HCT 45.7 07/14/2023 1509   HCT 43.8 05/08/2023 1536   PLT 251 07/14/2023 1509   MCV 88.1 07/14/2023 1509   MCH 29.7 07/14/2023 1509   MCHC 33.7 07/14/2023 1509   RDW 13.4 07/14/2023 1509   LYMPHSABS 2,595 07/09/2022 1000   MONOABS 594 05/17/2016 0816   EOSABS 89 07/14/2023 1509   BASOSABS 78 07/14/2023 1509       Latest Ref Rng & Units 07/14/2023    3:09 PM 07/09/2022   10:00 AM 04/17/2022    2:36 PM  BMP  Glucose 65 - 99 mg/dL 73  83  84   BUN 7 - 25 mg/dL 21  19  21    Creatinine 0.70 - 1.30 mg/dL 8.74  8.81  8.83   BUN/Creat Ratio 6 - 22 (calc) SEE NOTE:  SEE NOTE:  SEE NOTE:   Sodium 135 - 146 mmol/L 137  137  140   Potassium 3.5 - 5.3 mmol/L 4.1  4.3  4.2   Chloride 98 - 110 mmol/L 102  103  104   CO2 20 - 32 mmol/L 27  24  27    Calcium  8.6 - 10.3 mg/dL 9.6  9.1  8.8       IMAGING    CT CHEST LUNG CA SCREEN LOW DOSE W/O CM Result Date: 08/24/2023 CLINICAL DATA:  30 pack-year smoking history/quit 3 years ago EXAM: CT CHEST WITHOUT CONTRAST LOW-DOSE FOR LUNG CANCER SCREENING TECHNIQUE: Multidetector CT imaging of the chest was performed following the standard protocol without IV contrast. RADIATION DOSE REDUCTION: This exam was performed according to the departmental dose-optimization program which includes automated exposure control, adjustment of the mA and/or kV according to patient size and/or use of iterative reconstruction technique. COMPARISON:  None Available. FINDINGS: Cardiovascular: Aortic atherosclerosis. Normal heart size, without pericardial effusion. Mediastinum/Nodes: No mediastinal or hilar adenopathy, given limitations of unenhanced CT. Lungs/Pleura: No pleural fluid.  Mild centrilobular emphysema. Pulmonary nodules of maximally volume derived equivalent  diameter 3.6 mm. Lingular scarring. Upper Abdomen:  Mild hepatic steatosis. Normal imaged portions of the spleen, stomach, pancreas, gallbladder, adrenal glands, right kidney. Musculoskeletal: No acute osseous abnormality. IMPRESSION: 1. Lung-RADS 2, benign appearance or behavior. Continue annual screening with low-dose chest CT without contrast in 12 months. 2. Hepatic steatosis. 3. Aortic Atherosclerosis (ICD10-I70.0) and Emphysema (ICD10-J43.9). Electronically Signed   By: Rockey Kilts M.D.   On: 08/24/2023 18:33      ASSESSMENT/PLAN   53 year old pleasant white male seen today for assessment of shortness of breath with underlying diagnosis of severe sleep apnea with AHI of 30 currently on CPAP therapy in the setting of morbid obesity and deconditioned state enrolled in the lung cancer screening program  Assessment of shortness of breath Recommend pulmonary function testing No indication for bronchodilator therapy or nebulized therapy at this time No indication for antibiotics or prednisone  No exacerbation at this time No evidence of heart failure at this time No evidence or signs of infection at this time No respiratory distress No fevers, chills, nausea, vomiting, diarrhea No evidence of lower extremity edema No evidence hemoptysis Avoid Allergens and Irritants Avoid secondhand smoke Avoid SICK contacts Recommend  Masking  when appropriate Recommend Keep up-to-date with vaccinations    Assessment of OSA Recommend sending serial number so that we may download his compliance reports Patient states he uses every night and at naptime Patient states he has minimal events   Assessment of low-dose CT chest June 2025 No significant findings for malignancy Follow-up annually  Smoking Assessment and Cessation Counseling Upon further questioning, Patient smokes 1/2 ppd I have advised patient to quit/stop smoking as soon as possible due to high risk for multiple medical  problems Patient is willing to quit smoking I have advised patient that we can assist and have options of Nicotine replacement therapy. I also advised patient on behavioral therapy and can provide oral medication therapy in conjunction with the other therapies Follow up next Office visit  for assessment of smoking cessation Smoking cessation counseling advised for >10 minutes     MEDICATION ADJUSTMENTS/LABS AND TESTS ORDERED: Please stop smoking please stop vaping Obtain pulmonary function testing Follow-up lung cancer screening annually Obtain serial number from CPAP machine Avoid Allergens and Irritants Avoid secondhand smoke Avoid SICK contacts Recommend  Masking  when appropriate Recommend Keep up-to-date with vaccinations    CURRENT MEDICATIONS REVIEWED AT LENGTH WITH PATIENT TODAY   Patient  satisfied with Plan of action and management. All questions answered   Follow up 3 months   I spent a total of 65 minutes dedicated to the care of this patient on the date of this encounter to include pre-visit review of records, face-to-face time with the patient discussing conditions above, post visit ordering of testing, clinical documentation with the electronic health record, making appropriate referrals as documented, and communicating necessary information to the patient's healthcare team.    The Patient requires high complexity decision making for assessment and support, frequent evaluation and titration of therapies, application of advanced monitoring technologies and extensive interpretation of multiple databases.  Patient satisfied with Plan of action and management. All questions answered    Nickolas Alm Cellar, M.D.  [facility]  Medical Director [facility] Medical Director [facility] Cardio-Pulmonary Department

## 2023-08-27 ENCOUNTER — Telehealth: Payer: Self-pay | Admitting: Family Medicine

## 2023-08-27 NOTE — Telephone Encounter (Signed)
 lisinopril  (ZESTRIL ) 10 MG tablet   The last prescription was sent to [facility]

## 2023-08-27 NOTE — Telephone Encounter (Signed)
 New, Adine Essex, Exie PARAS, CMA; New, Bradley Hello,  You have been added to air view, however, this pap unit is part of the 3g shut off and will no longer downolad via internet. Pateint will need to bring in a DL card and / or pap unit for DL.  Thank you,  Brad New   Previous Messages  ----- Message ----- From: [place] Exie PARAS, CMA Sent: 08/27/2023   8:11 AM EDT To: Adine New Subject: can't find pt profile                          Hi Brad,  Can we please be added to this patient's Airview? I had tried to manually add him via his serial number but it says his profile can't be found.  Thank you,  Z :-)

## 2023-08-27 NOTE — Telephone Encounter (Signed)
 Refused no refills needed at this time

## 2023-09-02 DIAGNOSIS — G4733 Obstructive sleep apnea (adult) (pediatric): Secondary | ICD-10-CM | POA: Diagnosis not present

## 2023-09-08 ENCOUNTER — Other Ambulatory Visit: Payer: Self-pay | Admitting: Urology

## 2023-09-08 DIAGNOSIS — E349 Endocrine disorder, unspecified: Secondary | ICD-10-CM

## 2023-09-29 DIAGNOSIS — G4733 Obstructive sleep apnea (adult) (pediatric): Secondary | ICD-10-CM | POA: Diagnosis not present

## 2023-10-21 ENCOUNTER — Encounter

## 2023-10-29 DIAGNOSIS — G4733 Obstructive sleep apnea (adult) (pediatric): Secondary | ICD-10-CM | POA: Diagnosis not present

## 2023-11-11 ENCOUNTER — Encounter: Payer: Self-pay | Admitting: Family Medicine

## 2023-11-11 DIAGNOSIS — Z716 Tobacco abuse counseling: Secondary | ICD-10-CM

## 2023-11-11 DIAGNOSIS — F17210 Nicotine dependence, cigarettes, uncomplicated: Secondary | ICD-10-CM | POA: Diagnosis not present

## 2023-11-11 DIAGNOSIS — M47816 Spondylosis without myelopathy or radiculopathy, lumbar region: Secondary | ICD-10-CM | POA: Diagnosis not present

## 2023-11-11 DIAGNOSIS — I10 Essential (primary) hypertension: Secondary | ICD-10-CM | POA: Diagnosis not present

## 2023-11-11 DIAGNOSIS — M5136 Other intervertebral disc degeneration, lumbar region with discogenic back pain only: Secondary | ICD-10-CM | POA: Diagnosis not present

## 2023-11-11 DIAGNOSIS — R1031 Right lower quadrant pain: Secondary | ICD-10-CM | POA: Diagnosis not present

## 2023-11-11 DIAGNOSIS — M545 Low back pain, unspecified: Secondary | ICD-10-CM | POA: Diagnosis not present

## 2023-11-11 DIAGNOSIS — M62830 Muscle spasm of back: Secondary | ICD-10-CM | POA: Diagnosis not present

## 2023-11-11 DIAGNOSIS — R309 Painful micturition, unspecified: Secondary | ICD-10-CM | POA: Diagnosis not present

## 2023-11-11 DIAGNOSIS — N5082 Scrotal pain: Secondary | ICD-10-CM | POA: Diagnosis not present

## 2023-11-11 MED ORDER — NICOTINE 21 MG/24HR TD PT24: 21.0000 mg | MEDICATED_PATCH | Freq: Every day | TRANSDERMAL | 1 refills | Status: AC

## 2023-11-11 MED ORDER — NICOTINE 7 MG/24HR TD PT24: 7.0000 mg | MEDICATED_PATCH | Freq: Every day | TRANSDERMAL | 2 refills | Status: AC

## 2023-11-11 MED ORDER — NICOTINE 14 MG/24HR TD PT24: 14.0000 mg | MEDICATED_PATCH | Freq: Every day | TRANSDERMAL | 1 refills | Status: AC

## 2023-11-11 MED ORDER — NICOTINE POLACRILEX 2 MG MT GUM: CHEWING_GUM | OROMUCOSAL | 2 refills | Status: DC

## 2023-11-13 ENCOUNTER — Other Ambulatory Visit: Payer: Self-pay

## 2023-11-13 DIAGNOSIS — N402 Nodular prostate without lower urinary tract symptoms: Secondary | ICD-10-CM

## 2023-11-13 DIAGNOSIS — E349 Endocrine disorder, unspecified: Secondary | ICD-10-CM

## 2023-11-13 DIAGNOSIS — N400 Enlarged prostate without lower urinary tract symptoms: Secondary | ICD-10-CM

## 2023-11-13 DIAGNOSIS — E291 Testicular hypofunction: Secondary | ICD-10-CM

## 2023-11-14 ENCOUNTER — Other Ambulatory Visit

## 2023-11-14 DIAGNOSIS — E291 Testicular hypofunction: Secondary | ICD-10-CM | POA: Diagnosis not present

## 2023-11-14 DIAGNOSIS — E349 Endocrine disorder, unspecified: Secondary | ICD-10-CM

## 2023-11-14 DIAGNOSIS — N400 Enlarged prostate without lower urinary tract symptoms: Secondary | ICD-10-CM | POA: Diagnosis not present

## 2023-11-14 DIAGNOSIS — N402 Nodular prostate without lower urinary tract symptoms: Secondary | ICD-10-CM | POA: Diagnosis not present

## 2023-11-15 LAB — HEMOGLOBIN AND HEMATOCRIT, BLOOD
Hematocrit: 49.9 % (ref 37.5–51.0)
Hemoglobin: 16.4 g/dL (ref 13.0–17.7)

## 2023-11-15 LAB — PSA: Prostate Specific Ag, Serum: 0.7 ng/mL (ref 0.0–4.0)

## 2023-11-15 LAB — TESTOSTERONE: Testosterone: 582 ng/dL (ref 264–916)

## 2023-11-18 NOTE — Progress Notes (Unsigned)
 11/20/2023 9:43 AM   Jeffery [name] 1970/08/19 [id number]  Referring provider: Leavy Mole, PA-C [address] Jeffery [name],  [place] [address]  Urological history: 1. BPH with LU TS -PSA (10/2023) 0.7 -TUMT~ 5 years ago  -tadalafil  5 mg daily  2. Hypogonadism -contributing factors of age, diabetes,sleep apnea, chronic pain medications and obesity -testosterone  level (10/2023) 582 -hemoglobin/hematocrit(10/2023) 16.4/49.9 -applying AndroGel  50mg /5g, 2 packets daily  3. Prostate nodule -Paternal uncle with prostate cancer at age 77 -Prior prostate biopsy with Dr. Kassie around 2014 for a prostate nodule-negative   4. ED -contributing factors of age, testosterone  deficiency, diabetes, sleep apnea, obesity, HTN, BPH and smoking - tadalafil  20 mg on demand dosing   Chief Complaint  Patient presents with   Follow-up    HPI: Jeffery [name] is a 53 y.o. male who presents today for follow up.   Previous records reviewed.    He reports good adherence to testosterone  gel 50 mg/5 gm (1%) gel, 2 pumps daily.  Denies new complaints of low libido, erectile dysfunction, fatigue, or mood changes.  No complaints of gynecomastia, visual changes, or thromboembolic symptoms.  Energy level, libido and overall sense of wellbeing being reported as stable/ improved compared to prior visit.     Testosterone  level (10/2023) 582  Hemoglobin/hematocrit (10/2023) 16.4/49.9  He is having some postvoid dribbling.  He denies any weak urinary stream, strain to urinate, urinary hesitancy or nocturia.  Patient denies any modifying or aggravating factors.  Patient denies any recent UTI's, gross hematuria, dysuria or suprapubic/flank pain.  Patient denies any fevers, chills, nausea or vomiting.    UA (10/2023) bland  PSA (10/2023) 0.7  Serum creatinine (06/2023) 1.25, eGFR 70  Hemoglobin A1c (06/2023) 5.6  He reports good erections that are firm enough for penetration and maintained for  satisfactory intercourse.  Patient still having spontaneous erections.  He denies any pain or curvature with erections.  He is having no issues with ejaculation.  He is taking tadalafil  5 mg daily.  He is augmenting with either 20 mg of tadalafil  or 100 mg of sildenafil .  Cholesterol (06/2023) 252  PMH: Past Medical History:  Diagnosis Date   Biceps tendon rupture, right, subsequent encounter 03/20/2017   Dental crowns present    sides and back   Dyslipidemia    Enlarged prostate    Epididymitis    Headache    sinus   Hypotestosteronism    Lower urinary tract symptoms (LUTS) 06/14/2016   Nodular prostate with lower urinary tract symptoms    Obesity    Sleep apnea 07/2013   Testosterone  deficiency    Umbilical hernia    Umbilical hernia without obstruction or gangrene     Surgical History: Past Surgical History:  Procedure Laterality Date   COLONOSCOPY WITH PROPOFOL  N/A 01/25/2022   Procedure: COLONOSCOPY WITH PROPOFOL ;  Surgeon: Jeffery Bi, MD;  Location: [facility] ENDOSCOPY;  Service: Gastroenterology;  Laterality: N/A;   FRACTURE SURGERY  06/1997   HERNIA REPAIR  03/11/2014   umbilical hernia   NASAL SEPTOPLASTY W/ TURBINOPLASTY Bilateral 11/23/2014   Procedure: NASAL SEPTOPLASTY WITH TURBINATE REDUCTION;  Surgeon: Jeffery Hunter, MD;  Location: [facility];  Service: ENT;  Laterality: Bilateral;   PROSTATE BIOPSY  2015   ROOT CANAL     WRIST FRACTURE SURGERY Right     Home Medications:  Allergies as of 11/20/2023       Reactions   Sulfa  Antibiotics Nausea Only   Doxycycline Other (See Comments),  Nausea And Vomiting        Medication List        Accurate as of November 20, 2023  9:43 AM. If you have any questions, ask your nurse or doctor.          albuterol  108 (90 Base) MCG/ACT inhaler Commonly known as: VENTOLIN  HFA Inhale 2 puffs into the lungs every 4 (four) hours as needed for wheezing or shortness of breath (coughing fits).   fluticasone   50 MCG/ACT nasal spray Commonly known as: FLONASE  Place 2 sprays into both nostrils daily.   levocetirizine 5 MG tablet Commonly known as: XYZAL  Take 1 tablet (5 mg total) by mouth every evening.   lisinopril  10 MG tablet Commonly known as: ZESTRIL  TAKE 1 TABLET BY MOUTH DAILY   metFORMIN  500 MG tablet Commonly known as: GLUCOPHAGE  Take 2 tablets (1,000 mg total) by mouth 2 (two) times daily with a meal.   nicotine  7 mg/24hr patch Commonly known as: NICODERM CQ  - dosed in mg/24 hr Place 1 patch (7 mg total) onto the skin daily.   nicotine  14 mg/24hr patch Commonly known as: NICODERM CQ  - dosed in mg/24 hours Place 1 patch (14 mg total) onto the skin daily.   nicotine  21 mg/24hr patch Commonly known as: NICODERM CQ  - dosed in mg/24 hours Place 1 patch (21 mg total) onto the skin daily.   nicotine  polacrilex 2 MG gum Commonly known as: NICORETTE  Chew 1 piece of gum every 1 to 2 hours (maximum: 24 pieces/day)   omeprazole  20 MG capsule Commonly known as: PRILOSEC Take 1 capsule (20 mg total) by mouth daily as needed (GERD/reflux).   rosuvastatin  20 MG tablet Commonly known as: Crestor  Take 1 tablet (20 mg total) by mouth daily.   sildenafil  100 MG tablet Commonly known as: VIAGRA  Take 1 tablet (100 mg total) by mouth daily as needed for erectile dysfunction. Take two hours prior to intercourse on an empty stomach   tadalafil  20 MG tablet Commonly known as: CIALIS  Take 1 tablet (20 mg total) by mouth daily as needed for erectile dysfunction.   tadalafil  5 MG tablet Commonly known as: CIALIS  Take 1 tablet (5 mg total) by mouth daily as needed for erectile dysfunction.   testosterone  50 MG/5GM (1%) Gel Commonly known as: ANDROGEL  APPLY CONTENTS OF 2 PACKETS TO CLEAN, DRY AND INTACT SKIN DAILY( AVOID FACE AND SCROTUM)        Allergies:  Allergies  Allergen Reactions   Sulfa  Antibiotics Nausea Only   Doxycycline Other (See Comments) and Nausea And Vomiting     Family History: Family History  Problem Relation Age of Onset   Stroke Mother    Diabetes Mother    Cancer Mother        Colon Cancer   Alzheimer's disease Father    Hypertension Father    Cancer Maternal Grandmother        Lung-  Non-Smoker    Social History:  reports that he quit smoking about 3 years ago. His smoking use included cigarettes and e-cigarettes. He started smoking about 33 years ago. He has a 30 pack-year smoking history. He has quit using smokeless tobacco. He reports that he does not drink alcohol and does not use drugs.  ROS: Pertinent ROS in HPI  Physical Exam: BP 122/87   Pulse 99   Ht 5' 11 (1.803 m)   Wt 250 lb (113.4 kg)   BMI 34.87 kg/m   Constitutional:  Well nourished. Alert and oriented,  No acute distress. HEENT: [place] AT, moist mucus membranes.  Trachea midline Cardiovascular: No clubbing, cyanosis, or edema. Respiratory: Normal respiratory effort, no increased work of breathing. Neurologic: Grossly intact, no focal deficits, moving all 4 extremities. Psychiatric: Normal mood and affect.   Laboratory Data: See HPI and EPIC I have reviewed the labs.   Pertinent Imaging: No recent imaging  Assessment & Plan:    1. Hypogonadism -Testosterone  levels therapeutic  -Hemoglobin/hematocritnormal -Continue AndroGel  1% gel, 2 packets daily; refills given  2. BPH with LUTS -PSA stable  -continue conservative management, avoiding bladder irritants and timed voiding's -tadalafil  5 mg daily; refills given  3. ED -tadalafil  20 mg, on-demand-dosing -Also gave a prescription for sildenafil  100 mg on-demand dosing as he has headaches with tadalafil  20 mg -he alternates   No follow-ups on file.  These notes generated with voice recognition software. I apologize for typographical errors.  CLOTILDA HELON RIGGERS  [facility] [address]  [address] [name], [place] [address] [phone]

## 2023-11-20 ENCOUNTER — Ambulatory Visit (INDEPENDENT_AMBULATORY_CARE_PROVIDER_SITE_OTHER): Admitting: Urology

## 2023-11-20 ENCOUNTER — Encounter: Payer: Self-pay | Admitting: Urology

## 2023-11-20 VITALS — BP 122/87 | HR 99 | Ht 71.0 in | Wt 250.0 lb

## 2023-11-20 DIAGNOSIS — N529 Male erectile dysfunction, unspecified: Secondary | ICD-10-CM | POA: Diagnosis not present

## 2023-11-20 DIAGNOSIS — N402 Nodular prostate without lower urinary tract symptoms: Secondary | ICD-10-CM | POA: Diagnosis not present

## 2023-11-20 DIAGNOSIS — E291 Testicular hypofunction: Secondary | ICD-10-CM

## 2023-11-20 DIAGNOSIS — N400 Enlarged prostate without lower urinary tract symptoms: Secondary | ICD-10-CM

## 2023-11-20 DIAGNOSIS — E349 Endocrine disorder, unspecified: Secondary | ICD-10-CM

## 2023-11-20 MED ORDER — TESTOSTERONE 50 MG/5GM (1%) TD GEL: TRANSDERMAL | 5 refills | Status: AC

## 2023-11-20 MED ORDER — SILDENAFIL CITRATE 100 MG PO TABS: 100.0000 mg | ORAL_TABLET | Freq: Every day | ORAL | 3 refills | Status: AC | PRN

## 2023-11-20 MED ORDER — TADALAFIL 20 MG PO TABS: 20.0000 mg | ORAL_TABLET | Freq: Every day | ORAL | 3 refills | Status: AC | PRN

## 2023-11-20 MED ORDER — TADALAFIL 5 MG PO TABS: 5.0000 mg | ORAL_TABLET | Freq: Every day | ORAL | 3 refills | Status: AC | PRN

## 2023-11-21 ENCOUNTER — Ambulatory Visit: Admitting: Urology

## 2023-12-04 ENCOUNTER — Ambulatory Visit: Admitting: Internal Medicine

## 2023-12-17 ENCOUNTER — Ambulatory Visit (INDEPENDENT_AMBULATORY_CARE_PROVIDER_SITE_OTHER)

## 2023-12-17 ENCOUNTER — Ambulatory Visit: Admitting: Internal Medicine

## 2023-12-17 ENCOUNTER — Ambulatory Visit: Admitting: Nurse Practitioner

## 2023-12-17 ENCOUNTER — Encounter: Payer: Self-pay | Admitting: Nurse Practitioner

## 2023-12-17 VITALS — BP 120/68 | HR 98 | Ht 71.0 in | Wt 253.0 lb

## 2023-12-17 DIAGNOSIS — R0609 Other forms of dyspnea: Secondary | ICD-10-CM | POA: Diagnosis not present

## 2023-12-17 DIAGNOSIS — Z23 Encounter for immunization: Secondary | ICD-10-CM

## 2023-12-17 DIAGNOSIS — J432 Centrilobular emphysema: Secondary | ICD-10-CM | POA: Diagnosis not present

## 2023-12-17 DIAGNOSIS — R0602 Shortness of breath: Secondary | ICD-10-CM | POA: Diagnosis not present

## 2023-12-17 LAB — PULMONARY FUNCTION TEST
DL/VA % pred: 97 %
DL/VA: 4.31 ml/min/mmHg/L
DLCO unc % pred: 95 %
DLCO unc: 28.97 ml/min/mmHg
FEF 25-75 Post: 3.7 L/s
FEF 25-75 Pre: 3.4 L/s
FEF2575-%Change-Post: 8 %
FEF2575-%Pred-Post: 103 %
FEF2575-%Pred-Pre: 95 %
FEV1-%Change-Post: 2 %
FEV1-%Pred-Post: 92 %
FEV1-%Pred-Pre: 90 %
FEV1-Post: 3.76 L
FEV1-Pre: 3.66 L
FEV1FVC-%Change-Post: 1 %
FEV1FVC-%Pred-Pre: 98 %
FEV6-%Change-Post: 0 %
FEV6-%Pred-Post: 94 %
FEV6-%Pred-Pre: 94 %
FEV6-Post: 4.78 L
FEV6-Pre: 4.75 L
FEV6FVC-%Change-Post: 0 %
FEV6FVC-%Pred-Post: 103 %
FEV6FVC-%Pred-Pre: 103 %
FVC-%Change-Post: 1 %
FVC-%Pred-Post: 92 %
FVC-%Pred-Pre: 91 %
FVC-Post: 4.82 L
FVC-Pre: 4.77 L
Post FEV1/FVC ratio: 78 %
Post FEV6/FVC ratio: 100 %
Pre FEV1/FVC ratio: 77 %
Pre FEV6/FVC Ratio: 100 %
RV % pred: 114 %
RV: 2.4 L
TLC % pred: 100 %
TLC: 7.23 L

## 2023-12-17 MED ORDER — ALBUTEROL SULFATE HFA 108 (90 BASE) MCG/ACT IN AERS: 2.0000 | INHALATION_SPRAY | Freq: Four times a day (QID) | RESPIRATORY_TRACT | 2 refills | Status: AC | PRN

## 2023-12-17 NOTE — Progress Notes (Signed)
 @Patient  ID: Jeffery [name], male    DOB: 10-22-1970, 53 y.o.   MRN: [id number]  Chief Complaint  Patient presents with   Shortness of Breath    DOE. Some wheezing and cough. Cough in the mornings. PFT today.    Referring provider: Leavy Mole, PA-C  HPI: 53 year old male, active smoker followed for shortness of breath and emphysema. He is a patient of Dr. Jacqulyn and last seen in office 08/26/2023. Past medical history significant for HTN, OSA on CPAP, GERD, BPH, HLD, prediabetes.   TEST/EVENTS:  08/11/2023 LDCT chest: atherosclerosis. Emphysema. Nodules up to 3.6 mm. Lung RADS 2. Lingular scarring. Mild hepatic steatosis  12/17/2023 PFT: FVC 91, FEV1 90, ratio 78, TLC 100, DLCO 95. No BD  08/26/2023: OV with Dr. Isaiah. URI several months ago and prescribed z pack. Some SOB/DOE along with cough and congestion. Symptoms mostly resolved. No hypoxia on walk test. FeNO 8 ppb. Has a hx of severe OSA; on CPAP. Smoking cessation. Assessment of SOB with PFT ordered.   12/17/2023: Today - follow up Discussed the use of AI scribe software for clinical note transcription with the patient, who gave verbal consent to proceed.  History of Present Illness Jeffery [name] is a 53 year old male who presents for evaluation of lung function and respiratory symptoms.  He has a history of smoking and vaping, which contributes to his respiratory symptoms. He experiences occasional shortness of breath with exertion, especially more strenuous activities. His wife tells him that he does have wheezing. He has not used inhalers regularly in the past, only when sick. Occasional cough with clear phlegm, mostly in the mornings. No fevers, night sweats, weight loss. No coughing up blood, swelling in legs, orthopnea, PND, palpitations.  He has a history of sleep apnea, which is currently treated with CPAP.   He has not gotten his flu shot yet this year.    Allergies  Allergen Reactions   Sulfa  Antibiotics Nausea  Only   Doxycycline Other (See Comments) and Nausea And Vomiting    Immunization History  Administered Date(s) Administered   Influenza, Quadrivalent, Recombinant, Inj, Pf 12/06/2018   Influenza,inj,Quad PF,6+ Mos 12/18/2016, 10/10/2017, 11/24/2021   Influenza,trivalent, recombinat, inj, PF 10/25/2022   Influenza-Unspecified 10/26/2013, 09/26/2015, 01/24/2020, 12/09/2021   Moderna Sars-Covid-2 Vaccination 05/05/2019, 06/02/2019, 01/24/2020   PNEUMOCOCCAL CONJUGATE-20 09/04/2020   Tdap 03/15/2010, 12/18/2016    Past Medical History:  Diagnosis Date   Biceps tendon rupture, right, subsequent encounter 03/20/2017   Dental crowns present    sides and back   Dyslipidemia    Enlarged prostate    Epididymitis    Headache    sinus   Hypotestosteronism    Lower urinary tract symptoms (LUTS) 06/14/2016   Nodular prostate with lower urinary tract symptoms    Obesity    Sleep apnea 07/2013   Testosterone  deficiency    Umbilical hernia    Umbilical hernia without obstruction or gangrene     Tobacco History: Social History   Tobacco Use  Smoking Status Every Day   Current packs/day: 0.00   Average packs/day: 1 pack/day for 30.0 years (30.0 ttl pk-yrs)   Types: Cigarettes, E-cigarettes   Start date: 02/25/1990   Last attempt to quit: 02/26/2020   Years since quitting: 3.8  Smokeless Tobacco Former  Tobacco Comments   Smokes 7-8 cigarettes daily- khj 12/17/2023   Vapes daily as well      Former cigarette smoker 25-30 years 1 ppd total 25-30 packyear  hx quit in 2022   Past dip very occasionally like when hunting   Ready to quit: Not Answered Counseling given: Not Answered Tobacco comments: Smokes 7-8 cigarettes daily- khj 12/17/2023 Vapes daily as well  Former cigarette smoker 25-30 years 1 ppd total 25-30 packyear hx quit in 2022 Past dip very occasionally like when hunting   Outpatient Medications Prior to Visit  Medication Sig Dispense Refill   lisinopril  (ZESTRIL ) 10 MG  tablet TAKE 1 TABLET BY MOUTH DAILY 90 tablet 1   nicotine  (NICODERM CQ  - DOSED IN MG/24 HOURS) 14 mg/24hr patch Place 1 patch (14 mg total) onto the skin daily. 28 patch 1   nicotine  (NICODERM CQ  - DOSED IN MG/24 HOURS) 21 mg/24hr patch Place 1 patch (21 mg total) onto the skin daily. 28 patch 1   nicotine  (NICODERM CQ  - DOSED IN MG/24 HR) 7 mg/24hr patch Place 1 patch (7 mg total) onto the skin daily. 28 patch 2   nicotine  polacrilex (NICORETTE ) 2 MG gum Chew 1 piece of gum every 1 to 2 hours (maximum: 24 pieces/day) 100 tablet 2   omeprazole  (PRILOSEC) 20 MG capsule Take 1 capsule (20 mg total) by mouth daily as needed (GERD/reflux). 90 capsule 1   rosuvastatin  (CRESTOR ) 20 MG tablet Take 1 tablet (20 mg total) by mouth daily. 90 tablet 1   sildenafil  (VIAGRA ) 100 MG tablet Take 1 tablet (100 mg total) by mouth daily as needed for erectile dysfunction. Take two hours prior to intercourse on an empty stomach 90 tablet 3   tadalafil  (CIALIS ) 20 MG tablet Take 1 tablet (20 mg total) by mouth daily as needed for erectile dysfunction. 90 tablet 3   tadalafil  (CIALIS ) 5 MG tablet Take 1 tablet (5 mg total) by mouth daily as needed for erectile dysfunction. 90 tablet 3   testosterone  (ANDROGEL ) 50 MG/5GM (1%) GEL APPLY CONTENTS OF 2 PACKETS TO CLEAN, DRY AND INTACT SKIN DAILY( AVOID FACE AND SCROTUM) 300 g 5   No facility-administered medications prior to visit.     Review of Systems: as above    Physical Exam:  BP 120/68   Pulse 98   Ht 5' 11 (1.803 m)   Wt 253 lb (114.8 kg)   SpO2 96%   BMI 35.29 kg/m   GEN: Pleasant, interactive, well-appearing; obese; in no acute distress HEENT:  Normocephalic and atraumatic. PERRLA. Sclera white. Nasal turbinates pink, moist and patent bilaterally. No rhinorrhea present. Oropharynx pink and moist, without exudate or edema. No lesions, ulcerations, or postnasal drip.  NECK:  Supple w/ fair ROM. No JVD present. No lymphadenopathy.   CV: RRR, no  m/r/g, no peripheral edema. Pulses intact, +2 bilaterally. No cyanosis, pallor or clubbing. PULMONARY:  Unlabored, regular breathing. Clear bilaterally A&P w/o wheezes/rales/rhonchi. No accessory muscle use.  GI: BS present and normoactive. Soft, non-tender to palpation. MSK: No erythema, warmth or tenderness. Cap refil <2 sec all extrem.  Neuro: A/Ox3. No focal deficits noted.   Skin: Warm, no lesions or rashe Psych: Normal affect and behavior. Judgement and thought content appropriate.     Lab Results:  CBC    Component Value Date/Time   WBC 11.1 (H) 07/14/2023 1509   RBC 5.19 07/14/2023 1509   HGB 16.4 11/14/2023 0804   HCT 49.9 11/14/2023 0804   PLT 251 07/14/2023 1509   MCV 88.1 07/14/2023 1509   MCH 29.7 07/14/2023 1509   MCHC 33.7 07/14/2023 1509   RDW 13.4 07/14/2023 1509   LYMPHSABS 2,595 07/09/2022  1000   MONOABS 594 05/17/2016 0816   EOSABS 89 07/14/2023 1509   BASOSABS 78 07/14/2023 1509    BMET    Component Value Date/Time   NA 137 07/14/2023 1509   K 4.1 07/14/2023 1509   CL 102 07/14/2023 1509   CO2 27 07/14/2023 1509   GLUCOSE 73 07/14/2023 1509   BUN 21 07/14/2023 1509   CREATININE 1.25 07/14/2023 1509   CALCIUM  9.6 07/14/2023 1509   GFRNONAA 67 02/11/2020 1457   GFRAA 77 02/11/2020 1457    BNP No results found for: BNP   Imaging:  No results found.  Administration History     None          Latest Ref Rng & Units 12/17/2023    2:52 PM  PFT Results  FVC-Pre L 4.77  P  FVC-Predicted Pre % 91  P  FVC-Post L 4.82  P  FVC-Predicted Post % 92  P  Pre FEV1/FVC % % 77  P  Post FEV1/FCV % % 78  P  FEV1-Pre L 3.66  P  FEV1-Predicted Pre % 90  P  FEV1-Post L 3.76  P  DLCO uncorrected ml/min/mmHg 28.97  P  DLCO UNC% % 95  P  DLVA Predicted % 97  P  TLC L 7.23  P  TLC % Predicted % 100  P  RV % Predicted % 114  P    P Preliminary result    Lab Results  Component Value Date   NITRICOXIDE 8 08/26/2023        Assessment &  Plan:   Assessment & Plan Emphysema with suspected reactive airway disease and chronic cough Lung function testing normal. CT scan confirms mild emphysematous changes. Suspected reactive airway disease due to smoking and vaping. Chronic cough likely related to smoking. Will trial him on PRN albuterol  and reassess response. If he is utilizing consistently with perceived benefit, could consider scheduled bronchodilator regimen. Discussed importance of smoking cessation. Encouraged graded exercise and healthy weight loss measures to address deconditioning and DOE related to body habitus. Action plan in place.  - Prescribe albuterol  inhaler for symptomatic relief of shortness of breath or wheezing. Side effect profile reviewed - Advise monitoring inhaler use; if used regularly and effective, consider daily inhaler. - Encourage smoking cessation, including vaping. - Recommend exercise, diet, and weight loss to improve breathing. - Flu shot today   DOE See above. Given hx of severe OSA, recommend echocardiogram to rule out HF/PH as contributing component.  - Echocardiogram ordered today   Tobacco use disorder Current smoker and vaper, contributing to early emphysema and chronic cough. - Advise smoking cessation, including vaping, to improve lung health. - Continue to follow with lung cancer screening program; due June 2026  Obstructive sleep apnea, on treatment Currently treated. Aware of risks of untreated OSA. Safe driving practices reviewed.  - Order echocardiogram to assess heart function and rule out pulmonary hypertension. - Continue CPAP nightly     Advised if symptoms do not improve or worsen, to please contact office for sooner follow up or seek emergency care.   I spent 35 minutes of dedicated to the care of this patient on the date of this encounter to include pre-visit review of records, face-to-face time with the patient discussing conditions above, post visit ordering of testing,  clinical documentation with the electronic health record, making appropriate referrals as documented, and communicating necessary findings to members of the patients care team.  Comer LULLA Rouleau, NP 12/17/2023  Pt aware and understands NP's role.

## 2023-12-17 NOTE — Patient Instructions (Addendum)
 Albuterol  inhaler 2 puffs every 6 hours as needed for shortness of breath or wheezing. Notify if symptoms persist despite rescue inhaler/neb use.   Work on quitting smoking and vaping Graded exercises encouraged with healthy weight loss measures  Echocardiogram ordered of your heart - someone will contact you to schedule  Follow up in 6 weeks to see how inhaler is helping and review echo with Dr. Isaiah or Izetta Kourtnei Rauber,NP. If symptoms do not improve or worsen, please contact office for sooner follow up or seek emergency care.

## 2023-12-17 NOTE — Progress Notes (Signed)
 Full PFT completed today ? ?

## 2023-12-17 NOTE — Patient Instructions (Signed)
 Full PFT completed today ? ?

## 2023-12-24 ENCOUNTER — Ambulatory Visit

## 2023-12-29 DIAGNOSIS — G4733 Obstructive sleep apnea (adult) (pediatric): Secondary | ICD-10-CM | POA: Diagnosis not present

## 2024-01-05 ENCOUNTER — Ambulatory Visit: Payer: Self-pay | Admitting: Internal Medicine

## 2024-01-05 ENCOUNTER — Encounter: Payer: Self-pay | Admitting: Internal Medicine

## 2024-01-08 ENCOUNTER — Ambulatory Visit

## 2024-01-15 ENCOUNTER — Telehealth: Payer: Self-pay

## 2024-01-15 DIAGNOSIS — I10 Essential (primary) hypertension: Secondary | ICD-10-CM

## 2024-01-15 MED ORDER — LISINOPRIL 10 MG PO TABS: ORAL_TABLET | ORAL | 0 refills | Status: DC

## 2024-01-15 NOTE — Telephone Encounter (Signed)
Refill on    lisinopril (ZESTRIL) 10 MG tablet

## 2024-02-02 ENCOUNTER — Telehealth: Payer: Self-pay

## 2024-02-02 NOTE — Telephone Encounter (Signed)
 Called [employer] and started A PA request

## 2024-02-04 ENCOUNTER — Telehealth: Payer: Self-pay | Admitting: Family Medicine

## 2024-02-04 NOTE — Telephone Encounter (Signed)
 Refused not due

## 2024-02-04 NOTE — Telephone Encounter (Signed)
 lisinopril  (ZESTRIL ) 10 MG tablet   #90 quantity

## 2024-02-05 DIAGNOSIS — G4733 Obstructive sleep apnea (adult) (pediatric): Secondary | ICD-10-CM | POA: Diagnosis not present

## 2024-02-09 ENCOUNTER — Ambulatory Visit: Admitting: Nurse Practitioner

## 2024-02-16 NOTE — Telephone Encounter (Signed)
 Initialed a auth for T gel via cover my meds.   Key- [id number]  Uploaded last office visit and recent T lab.   PA Case ID #: [id number] Rx #: [id number]

## 2024-03-16 ENCOUNTER — Ambulatory Visit: Admitting: Nurse Practitioner

## 2024-03-19 ENCOUNTER — Encounter: Payer: Self-pay | Admitting: Nurse Practitioner

## 2024-03-19 ENCOUNTER — Other Ambulatory Visit (HOSPITAL_COMMUNITY): Payer: Self-pay

## 2024-03-19 ENCOUNTER — Ambulatory Visit: Admitting: Nurse Practitioner

## 2024-03-19 VITALS — BP 128/88 | HR 91 | Temp 98.4°F | Resp 18 | Ht 71.0 in | Wt 249.3 lb

## 2024-03-19 DIAGNOSIS — E785 Hyperlipidemia, unspecified: Secondary | ICD-10-CM | POA: Diagnosis not present

## 2024-03-19 DIAGNOSIS — E781 Pure hyperglyceridemia: Secondary | ICD-10-CM | POA: Diagnosis not present

## 2024-03-19 DIAGNOSIS — N401 Enlarged prostate with lower urinary tract symptoms: Secondary | ICD-10-CM | POA: Diagnosis not present

## 2024-03-19 DIAGNOSIS — G4733 Obstructive sleep apnea (adult) (pediatric): Secondary | ICD-10-CM | POA: Diagnosis not present

## 2024-03-19 DIAGNOSIS — E66812 Obesity, class 2: Secondary | ICD-10-CM

## 2024-03-19 DIAGNOSIS — Z131 Encounter for screening for diabetes mellitus: Secondary | ICD-10-CM | POA: Diagnosis not present

## 2024-03-19 DIAGNOSIS — I10 Essential (primary) hypertension: Secondary | ICD-10-CM

## 2024-03-19 DIAGNOSIS — K219 Gastro-esophageal reflux disease without esophagitis: Secondary | ICD-10-CM

## 2024-03-19 DIAGNOSIS — N138 Other obstructive and reflux uropathy: Secondary | ICD-10-CM

## 2024-03-19 DIAGNOSIS — E291 Testicular hypofunction: Secondary | ICD-10-CM

## 2024-03-19 DIAGNOSIS — Z6835 Body mass index (BMI) 35.0-35.9, adult: Secondary | ICD-10-CM | POA: Diagnosis not present

## 2024-03-19 MED ORDER — LISINOPRIL 10 MG PO TABS: ORAL_TABLET | ORAL | 0 refills | Status: AC

## 2024-03-19 MED ORDER — WEGOVY 0.25 MG/0.5ML ~~LOC~~ SOAJ: 0.2500 mg | SUBCUTANEOUS | 0 refills | Status: DC

## 2024-03-19 MED ORDER — ROSUVASTATIN CALCIUM 20 MG PO TABS: 20.0000 mg | ORAL_TABLET | Freq: Every day | ORAL | 1 refills | Status: AC

## 2024-03-19 NOTE — Progress Notes (Addendum)
 "  BP 128/88   Pulse 91   Temp 98.4 F (36.9 C) (Oral)   Resp 18   Ht 5' 11 (1.803 m)   Wt 249 lb 4.8 oz (113.1 kg)   SpO2 98%   BMI 34.77 kg/m    Subjective:    Patient ID: Jeffery [name], Jeffery [name]    DOB: 09-Jan-1971, 54 y.o.   MRN: [id number]  HPI: Jeffery [name] is a 54 y.o. Jeffery [name]  Chief Complaint  Patient presents with   Follow-up   Discussed the use of AI scribe software for clinical note transcription with the patient, who gave verbal consent to proceed.  History of Present Illness Jeffery [name] is a 54 year old Jeffery [name] with a history of hypertension, sleep apnea, GERD, hypogonadism, BPH, dyslipidemia, obesity, hyperlipidemia, and prediabetes who presents for a follow-up visit.  Hypertriglyceridemia and dyslipidemia - History of elevated triglycerides, with most recent lipoprotein test in May 2025 showing triglycerides at 1000 mg/dL - Currently taking Crestor  for cholesterol management, which is better tolerated than previous therapy with Lipitor due to fewer gastrointestinal side effects - Increased adherence to cholesterol medication regimen after switching to Crestor   Obesity and weight management - Interested in weight loss medications - Previous use of Ozempic  was effective for weight loss back in 2022 - Change in insurance coverage may impact access to weight loss medications -he has tried for many years to lose weight with diet and exercise -physically active, eats in a caloric deficit  Wt Readings from Last 3 Encounters:  03/19/24 249 lb 4.8 oz (113.1 kg)  12/17/23 253 lb (114.8 kg)  12/17/23 253 lb (114.8 kg)   Body mass index is 34.77 kg/m.  - Encourage continuation of lifestyle modifications, including dietary management and regular exercise. -continue to increase physical activity, getting at least 150 min of physical activity a week.  Work on including runner, broadcasting/film/video 2 days a week.  - continue eating at a calorie deficit 2000-2200 cal a day, eating a well  balanced diet with whole foods, avoiding processed foods.   Patient is motivated to continue working on lifestyle modification.   -no history of pancreatitis or family history of thyroid  cancer  Insulin  resistance and prediabetes - History of insulin  resistance and prediabetes - No diagnosis of diabetes  Obstructive sleep apnea - Severe sleep apnea managed with CPAP therapy  Gastroesophageal reflux disease (gerd) - History of GERD - No current mention of symptoms or recent exacerbations  Benign prostatic hyperplasia (bph) and hypogonadism - History of BPH and hypogonadism - No current mention of urinary or hormonal symptoms  Substance use - No longer uses Nicorette  gum  Relevant negative history - No personal history of pancreatitis - No family history of thyroid  cancer         03/19/2024    8:04 AM 07/14/2023    2:16 PM 07/09/2022    9:04 AM  Depression screen PHQ 2/9  Decreased Interest 0 0 0  Down, Depressed, Hopeless 0 0 0  PHQ - 2 Score 0 0 0  Altered sleeping  0 0  Tired, decreased energy  0 0  Change in appetite  0 0  Feeling bad or failure about yourself   0 0  Trouble concentrating  0 0  Moving slowly or fidgety/restless  0 0  Suicidal thoughts  0 0  PHQ-9 Score  0  0   Difficult doing work/chores   Not difficult at all     Data saved with  a previous flowsheet row definition    Relevant past medical, surgical, family and social history reviewed and updated as indicated. Interim medical history since our last visit reviewed. Allergies and medications reviewed and updated.  Review of Systems  Constitutional: Negative for fever or weight change.  Respiratory: Negative for cough and shortness of breath.   Cardiovascular: Negative for chest pain or palpitations.  Gastrointestinal: Negative for abdominal pain, no bowel changes.  Musculoskeletal: Negative for gait problem or joint swelling.  Skin: Negative for rash.  Neurological: Negative for dizziness or  headache.  No other specific complaints in a complete review of systems (except as listed in HPI above).      Objective:      BP 128/88   Pulse 91   Temp 98.4 F (36.9 C) (Oral)   Resp 18   Ht 5' 11 (1.803 m)   Wt 249 lb 4.8 oz (113.1 kg)   SpO2 98%   BMI 34.77 kg/m    Wt Readings from Last 3 Encounters:  03/19/24 249 lb 4.8 oz (113.1 kg)  12/17/23 253 lb (114.8 kg)  12/17/23 253 lb (114.8 kg)    Physical Exam VITALS: BP- 128/88 MEASUREMENTS: Weight- 243. GENERAL: Alert, cooperative, well developed, no acute distress HEENT: Normocephalic, normal oropharynx, moist mucous membranes CHEST: Clear to auscultation bilaterally, no wheezes, rhonchi, or crackles CARDIOVASCULAR: Normal heart rate and rhythm, S1 and S2 normal without murmurs ABDOMEN: Soft, non-tender, non-distended, without organomegaly, normal bowel sounds EXTREMITIES: No cyanosis or edema NEUROLOGICAL: Cranial nerves grossly intact, moves all extremities without gross motor or sensory deficit  Results for orders placed or performed in visit on 12/17/23  Pulmonary function test   Collection Time: 12/17/23  2:52 PM  Result Value Ref Range   FVC-Pre 4.77 L   FVC-%Pred-Pre 91 %   FVC-Post 4.82 L   FVC-%Pred-Post 92 %   FVC-%Change-Post 1 %   FEV1-Pre 3.66 L   FEV1-%Pred-Pre 90 %   FEV1-Post 3.76 L   FEV1-%Pred-Post 92 %   FEV1-%Change-Post 2 %   FEV6-Pre 4.75 L   FEV6-%Pred-Pre 94 %   FEV6-Post 4.78 L   FEV6-%Pred-Post 94 %   FEV6-%Change-Post 0 %   Pre FEV1/FVC ratio 77 %   FEV1FVC-%Pred-Pre 98 %   Post FEV1/FVC ratio 78 %   FEV1FVC-%Change-Post 1 %   Pre FEV6/FVC Ratio 100 %   FEV6FVC-%Pred-Pre 103 %   Post FEV6/FVC ratio 100 %   FEV6FVC-%Pred-Post 103 %   FEV6FVC-%Change-Post 0 %   FEF 25-75 Pre 3.40 L/sec   FEF2575-%Pred-Pre 95 %   FEF 25-75 Post 3.70 L/sec   FEF2575-%Pred-Post 103 %   FEF2575-%Change-Post 8 %   RV 2.40 L   RV % pred 114 %   TLC 7.23 L   TLC % pred 100 %   DLCO unc 28.97  ml/min/mmHg   DLCO unc % pred 95 %   DL/VA 5.68 ml/min/mmHg/L   DL/VA % pred 97 %          Assessment & Plan:   Problem List Items Addressed This Visit       Cardiovascular and Mediastinum   Essential hypertension - Primary   Relevant Medications   lisinopril  (ZESTRIL ) 10 MG tablet   rosuvastatin  (CRESTOR ) 20 MG tablet   Other Relevant Orders   CBC with Differential/Platelet   Comprehensive metabolic panel with GFR     Respiratory   Obstructive sleep apnea syndrome     Digestive   Gastroesophageal reflux disease without  esophagitis     Endocrine   Hypogonadism Jeffery [name]     Genitourinary   Benign prostatic hyperplasia with urinary obstruction     Other   Dyslipidemia   Relevant Medications   rosuvastatin  (CRESTOR ) 20 MG tablet   Other Relevant Orders   Lipid panel   Class 2 severe obesity with serious comorbidity and body mass index (BMI) of 35.0 to 35.9 in adult   Relevant Medications   semaglutide -weight management (WEGOVY ) 0.25 MG/0.5ML SOAJ SQ injection   Hypertriglyceridemia   Relevant Medications   lisinopril  (ZESTRIL ) 10 MG tablet   rosuvastatin  (CRESTOR ) 20 MG tablet   Other Relevant Orders   Lipid panel   Other Visit Diagnoses       Screening for diabetes mellitus       Relevant Orders   Comprehensive metabolic panel with GFR   Hemoglobin A1c        Assessment and Plan Assessment & Plan Class 2 severe obesity Current weight of 249.3 pounds. Previous successful weight loss with Ozempic . Insurance coverage for weight loss medications is uncertain due to changes. Discussed potential use of Wegovy  and Zepbound , with Zepbound  offering an oral option at a lower cost. Emphasized the importance of diet and exercise in conjunction with medication. - Submitted prior authorization for Wegovy . - If Wegovy  is denied, will consider Zepbound  oral option. - Recommended eating in a calorie deficit aiming for 2000 to 2200 calories a day. - Encouraged physical  activity, exercising at least 150 minutes a week. -comorbidties include htn, severe sleep apnea, GERD,HLD  Essential hypertension Managed with lisinopril  10 mg daily. Blood pressure reading today was 128/88 mmHg. - Refilled lisinopril  10 mg daily.  Dyslipidemia with hypertriglyceridemia Previous triglyceride level of 1000 mg/dL. Currently on Crestor , which is better tolerated than previous medication (Lipitor). - Ordered lipid panel to recheck cholesterol and triglyceride levels. - Refilled Crestor .  Obstructive sleep apnea syndrome Severe obstructive sleep apnea managed with CPAP machine. Insurance coverage for weight loss medications may be influenced by severity of sleep apnea. - Documented use of CPAP machine for insurance purposes.  Gastroesophageal reflux disease Managed with omeprazole  20 mg daily as needed.   Hypogonadism/BPH -managed by urology with testosterone , tadalafil  and sildenafil        Follow up plan: Return in about 3 months (around 06/17/2024) for follow up if approved for weight loss medication. "

## 2024-03-20 LAB — CBC WITH DIFFERENTIAL/PLATELET
Absolute Lymphocytes: 2941 {cells}/uL (ref 850–3900)
Absolute Monocytes: 539 {cells}/uL (ref 200–950)
Basophils Absolute: 70 {cells}/uL (ref 0–200)
Basophils Relative: 0.8 %
Eosinophils Absolute: 104 {cells}/uL (ref 15–500)
Eosinophils Relative: 1.2 %
HCT: 44.7 % (ref 39.4–51.1)
Hemoglobin: 15.2 g/dL (ref 13.2–17.1)
MCH: 30.2 pg (ref 27.0–33.0)
MCHC: 34 g/dL (ref 31.6–35.4)
MCV: 88.7 fL (ref 81.4–101.7)
MPV: 10.6 fL (ref 7.5–12.5)
Monocytes Relative: 6.2 %
Neutro Abs: 5046 {cells}/uL (ref 1500–7800)
Neutrophils Relative %: 58 %
Platelets: 251 10*3/uL (ref 140–400)
RBC: 5.04 Million/uL (ref 4.20–5.80)
RDW: 13.2 % (ref 11.0–15.0)
Total Lymphocyte: 33.8 %
WBC: 8.7 10*3/uL (ref 3.8–10.8)

## 2024-03-20 LAB — LIPID PANEL
Cholesterol: 191 mg/dL
HDL: 29 mg/dL — ABNORMAL LOW
Non-HDL Cholesterol (Calc): 162 mg/dL — ABNORMAL HIGH
Total CHOL/HDL Ratio: 6.6 (calc) — ABNORMAL HIGH
Triglycerides: 622 mg/dL — ABNORMAL HIGH

## 2024-03-20 LAB — COMPREHENSIVE METABOLIC PANEL WITH GFR
AG Ratio: 2 (calc) (ref 1.0–2.5)
ALT: 15 U/L (ref 9–46)
AST: 23 U/L (ref 10–35)
Albumin: 4.5 g/dL (ref 3.6–5.1)
Alkaline phosphatase (APISO): 71 U/L (ref 35–144)
BUN/Creatinine Ratio: 26 (calc) — ABNORMAL HIGH (ref 6–22)
BUN: 26 mg/dL — ABNORMAL HIGH (ref 7–25)
CO2: 24 mmol/L (ref 20–32)
Calcium: 9.5 mg/dL (ref 8.6–10.3)
Chloride: 105 mmol/L (ref 98–110)
Creat: 1 mg/dL (ref 0.70–1.30)
Globulin: 2.3 g/dL (ref 1.9–3.7)
Glucose, Bld: 120 mg/dL — ABNORMAL HIGH (ref 65–99)
Potassium: 4.3 mmol/L (ref 3.5–5.3)
Sodium: 138 mmol/L (ref 135–146)
Total Bilirubin: 0.3 mg/dL (ref 0.2–1.2)
Total Protein: 6.8 g/dL (ref 6.1–8.1)
eGFR: 91 mL/min/{1.73_m2}

## 2024-03-20 LAB — HEMOGLOBIN A1C
Hgb A1c MFr Bld: 5.4 %
Mean Plasma Glucose: 108 mg/dL
eAG (mmol/L): 6 mmol/L

## 2024-03-22 ENCOUNTER — Ambulatory Visit: Payer: Self-pay | Admitting: Nurse Practitioner

## 2024-03-23 ENCOUNTER — Telehealth: Payer: Self-pay | Admitting: Pharmacy Technician

## 2024-03-23 ENCOUNTER — Other Ambulatory Visit (HOSPITAL_COMMUNITY): Payer: Self-pay

## 2024-03-23 NOTE — Telephone Encounter (Signed)
 Pharmacy Patient Advocate Encounter   Received notification from [facility] that prior authorization for Wegovy  0.25 mg/0.77ml is required/requested.   Insurance verification completed.   The patient is insured through [employer].   Per test claim: Per test claim, medication is not covered due to plan/benefit exclusion, PA not submitted at this time

## 2024-03-24 ENCOUNTER — Other Ambulatory Visit: Payer: Self-pay | Admitting: Nurse Practitioner

## 2024-03-24 ENCOUNTER — Other Ambulatory Visit (HOSPITAL_COMMUNITY): Payer: Self-pay

## 2024-03-24 DIAGNOSIS — G4733 Obstructive sleep apnea (adult) (pediatric): Secondary | ICD-10-CM

## 2024-03-24 DIAGNOSIS — Z6835 Body mass index (BMI) 35.0-35.9, adult: Secondary | ICD-10-CM

## 2024-03-24 MED ORDER — ZEPBOUND 2.5 MG/0.5ML ~~LOC~~ SOAJ: 2.5000 mg | SUBCUTANEOUS | 0 refills | Status: AC

## 2024-03-25 ENCOUNTER — Other Ambulatory Visit (HOSPITAL_COMMUNITY): Payer: Self-pay

## 2024-03-25 ENCOUNTER — Ambulatory Visit
Admission: RE | Admit: 2024-03-25 | Discharge: 2024-03-25 | Disposition: A | Discharge status: Home / Self Care | Source: Ambulatory Visit | Attending: Nurse Practitioner | Admitting: Nurse Practitioner

## 2024-03-25 DIAGNOSIS — I272 Pulmonary hypertension, unspecified: Secondary | ICD-10-CM | POA: Diagnosis present

## 2024-03-25 DIAGNOSIS — E119 Type 2 diabetes mellitus without complications: Secondary | ICD-10-CM | POA: Insufficient documentation

## 2024-03-25 DIAGNOSIS — I10 Essential (primary) hypertension: Secondary | ICD-10-CM | POA: Insufficient documentation

## 2024-03-25 DIAGNOSIS — G4730 Sleep apnea, unspecified: Secondary | ICD-10-CM | POA: Insufficient documentation

## 2024-03-25 DIAGNOSIS — R0609 Other forms of dyspnea: Secondary | ICD-10-CM | POA: Insufficient documentation

## 2024-03-25 DIAGNOSIS — E785 Hyperlipidemia, unspecified: Secondary | ICD-10-CM | POA: Diagnosis not present

## 2024-03-25 DIAGNOSIS — I081 Rheumatic disorders of both mitral and tricuspid valves: Secondary | ICD-10-CM | POA: Diagnosis not present

## 2024-03-25 DIAGNOSIS — I119 Hypertensive heart disease without heart failure: Secondary | ICD-10-CM | POA: Diagnosis not present

## 2024-03-25 DIAGNOSIS — F17200 Nicotine dependence, unspecified, uncomplicated: Secondary | ICD-10-CM | POA: Insufficient documentation

## 2024-03-25 LAB — ECHOCARDIOGRAM COMPLETE
AR max vel: 3.35 cm2
AV Area VTI: 3.53 cm2
AV Area mean vel: 3.24 cm2
AV Mean grad: 3 mmHg
AV Peak grad: 5.7 mmHg
Ao pk vel: 1.19 m/s
Area-P 1/2: 5.62 cm2
S' Lateral: 2.9 cm

## 2024-03-26 ENCOUNTER — Ambulatory Visit: Payer: Self-pay | Admitting: Nurse Practitioner

## 2024-03-26 DIAGNOSIS — I5189 Other ill-defined heart diseases: Secondary | ICD-10-CM

## 2024-03-26 NOTE — Progress Notes (Signed)
 Echocardiogram with normal pumping function of the heart. He does have some diastolic dysfunction (impaired relaxation/stiffness of the heart), which can contribute to DOE. He also has some mild leakage from his mitral valve, which is unlikely causing any symptoms but should be monitored. Please send referral to cardiology. Can f/u with pulm as scheduled. Thanks.

## 2024-03-29 ENCOUNTER — Telehealth

## 2024-03-29 ENCOUNTER — Ambulatory Visit: Payer: Self-pay

## 2024-03-29 DIAGNOSIS — J0140 Acute pansinusitis, unspecified: Secondary | ICD-10-CM

## 2024-03-29 MED ORDER — AMOXICILLIN-POT CLAVULANATE 875-125 MG PO TABS: 1.0000 | ORAL_TABLET | Freq: Two times a day (BID) | ORAL | 0 refills | Status: AC

## 2024-03-29 NOTE — Progress Notes (Signed)
 "   MyChart Video Visit    Virtual Visit via Video Note   This format is felt to be most appropriate for this patient at this time. Physical exam was limited by quality of the video and audio technology used for the visit.    Patient location: home Provider location: [facility] Persons involved in the visit: patient, provider  I discussed the limitations of evaluation and management by telemedicine and the availability of in person appointments. The patient expressed understanding and agreed to proceed.  Patient: Jeffery [name]   DOB: [birth year]   54 y.o. Male  MRN: [id number] Visit Date: 03/29/2024  Today's healthcare provider: Isaiah DELENA Pepper, MD   Chief Complaint  Patient presents with   Acute Visit   Sinus Problem   Subjective    HPI   Discussed the use of AI scribe software for clinical note transcription with the patient, who gave verbal consent to proceed.  History of Present Illness BRAEDAN MEUTH is a 54 year old male with a history of sinus infections who presents with sinus pressure and pain.  He has been experiencing significant sinus pressure and pain for three days, describing it as a 'real bad sinus infection.' He has been using over-the-counter Mucinex  without much relief. He wakes up with a lot of green, yellowish mucous in his nose but does not have significant coughing. He feels congested and has a headache.  He has a history of sinus infections and underwent sinus surgery approximately ten years ago, which improved his condition. However, he still experiences sinus infections periodically throughout the year.  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  No shortness of breath, sore throat, fever, or chills. He has no known medication allergies but reports that doxycycline made him vomit  when taken on an empty stomach. He avoids sulfa  drugs and has previously taken amoxicillin  without issues.   Review of systems as noted in HPI.      Objective    There were no vitals taken for this visit.      Physical Exam HENT:     Head: Normocephalic.  Pulmonary:     Effort: Pulmonary effort is normal.  Neurological:     Mental Status: He is alert.  Psychiatric:        Mood and Affect: Mood normal.        Assessment & Plan     Problem List Items Addressed This Visit   None Visit Diagnoses       Acute non-recurrent pansinusitis    -  Primary   Relevant Medications   amoxicillin -clavulanate (AUGMENTIN ) 875-125 MG tablet       Assessment & Plan Acute pansinusitis Sinus pressure, headache, and greenish-yellow nasal discharge for three days. No fever, chills, sore throat, chest pain or shortness of breath. Hx of sinus surgery ten years ago. Differential includes viral versus bacterial etiology. Given hx of recurrent sinus infections, will treat with antibiotic. - Prescribed Augmentin  (amoxicillin /clavulanate) once in the morning and once at night for seven days. - Advised taking medication with food to prevent nausea. - Instructed to seek medical attention if symptoms worsen, such as shortness of breath or chest pain.   Meds ordered this encounter  Medications   amoxicillin -clavulanate (AUGMENTIN ) 875-125 MG tablet    Sig: Take 1 tablet by  mouth 2 (two) times daily for 7 days.    Dispense:  14 tablet    Refill:  0     No follow-ups on file.     I discussed the assessment and treatment plan with the patient. The patient was provided an opportunity to ask questions and all were answered. The patient agreed with the plan and demonstrated an understanding of the instructions.   The patient was advised to call back or seek an in-person evaluation if the symptoms worsen or if the condition fails to improve as anticipated.   Isaiah DELENA Pepper, MD [facility] [phone] (phone) [phone] (fax) "

## 2024-03-30 ENCOUNTER — Ambulatory Visit: Admitting: Internal Medicine

## 2024-03-30 ENCOUNTER — Telehealth: Payer: Self-pay

## 2024-03-30 ENCOUNTER — Other Ambulatory Visit (HOSPITAL_COMMUNITY): Payer: Self-pay

## 2024-03-30 NOTE — Telephone Encounter (Signed)
 Pharmacy Patient Advocate Encounter   Received notification from CoverMyMeds that prior authorization for Wegovy  0.25mg /0.9ml is required/requested. Wegovy  was d/c'd and changed to Zepbound .   Received notification from chart notes that prior authorization for Zepbound  2.5mg /0.81ml is required/requested.   Insurance verification completed.   The patient is insured through [employer] [place] .   Per test claim: PA required; PA submitted to above mentioned insurance via [email or web address] Key/confirmation #/EOC ** Status is pending

## 2024-03-30 NOTE — Telephone Encounter (Signed)
 ERROR

## 2024-04-22 ENCOUNTER — Ambulatory Visit: Admitting: Internal Medicine

## 2024-05-19 ENCOUNTER — Other Ambulatory Visit

## 2024-06-22 ENCOUNTER — Ambulatory Visit: Admitting: Nurse Practitioner

## 2024-07-14 ENCOUNTER — Encounter: Admitting: Family Medicine
